# Patient Record
Sex: Female | Born: 1970 | Race: Black or African American | Hispanic: No | Marital: Single | Smoking: Never smoker
Health system: Southern US, Community
[De-identification: ages and names within clinical notes are randomized; demographics above are authoritative.]

## PROBLEM LIST (undated history)

## (undated) ENCOUNTER — Inpatient Hospital Stay (HOSPITAL_COMMUNITY): Payer: Self-pay

## (undated) DIAGNOSIS — D649 Anemia, unspecified: Secondary | ICD-10-CM

## (undated) DIAGNOSIS — T7840XA Allergy, unspecified, initial encounter: Secondary | ICD-10-CM

## (undated) HISTORY — DX: Allergy, unspecified, initial encounter: T78.40XA

## (undated) HISTORY — DX: Anemia, unspecified: D64.9

## (undated) HISTORY — PX: NO PAST SURGERIES: SHX2092

---

## 2000-05-02 ENCOUNTER — Ambulatory Visit (HOSPITAL_COMMUNITY): Admission: RE | Admit: 2000-05-02 | Discharge: 2000-05-02 | Payer: Self-pay | Admitting: Obstetrics

## 2000-09-19 ENCOUNTER — Observation Stay (HOSPITAL_COMMUNITY): Admission: AD | Admit: 2000-09-19 | Discharge: 2000-09-20 | Payer: Self-pay | Admitting: *Deleted

## 2000-09-20 ENCOUNTER — Encounter: Payer: Self-pay | Admitting: Obstetrics & Gynecology

## 2000-09-23 ENCOUNTER — Encounter (HOSPITAL_COMMUNITY): Admission: RE | Admit: 2000-09-23 | Discharge: 2000-09-27 | Payer: Self-pay | Admitting: Obstetrics & Gynecology

## 2000-09-27 ENCOUNTER — Inpatient Hospital Stay (HOSPITAL_COMMUNITY): Admission: AD | Admit: 2000-09-27 | Discharge: 2000-10-02 | Payer: Self-pay | Admitting: Obstetrics

## 2003-02-22 ENCOUNTER — Ambulatory Visit (HOSPITAL_COMMUNITY): Admission: RE | Admit: 2003-02-22 | Discharge: 2003-02-22 | Payer: Self-pay | Admitting: Obstetrics and Gynecology

## 2003-04-12 ENCOUNTER — Inpatient Hospital Stay (HOSPITAL_COMMUNITY): Admission: AD | Admit: 2003-04-12 | Discharge: 2003-04-12 | Payer: Self-pay | Admitting: Obstetrics and Gynecology

## 2003-04-20 ENCOUNTER — Ambulatory Visit (HOSPITAL_COMMUNITY): Admission: RE | Admit: 2003-04-20 | Discharge: 2003-04-20 | Payer: Self-pay | Admitting: *Deleted

## 2003-04-21 ENCOUNTER — Encounter: Admission: RE | Admit: 2003-04-21 | Discharge: 2003-04-21 | Payer: Self-pay | Admitting: *Deleted

## 2003-04-28 ENCOUNTER — Encounter: Admission: RE | Admit: 2003-04-28 | Discharge: 2003-04-28 | Payer: Self-pay | Admitting: *Deleted

## 2003-05-05 ENCOUNTER — Encounter: Admission: RE | Admit: 2003-05-05 | Discharge: 2003-05-05 | Payer: Self-pay | Admitting: *Deleted

## 2003-05-05 ENCOUNTER — Ambulatory Visit (HOSPITAL_COMMUNITY): Admission: RE | Admit: 2003-05-05 | Discharge: 2003-05-05 | Payer: Self-pay | Admitting: *Deleted

## 2003-05-12 ENCOUNTER — Encounter: Admission: RE | Admit: 2003-05-12 | Discharge: 2003-05-12 | Payer: Self-pay | Admitting: *Deleted

## 2003-05-19 ENCOUNTER — Encounter: Admission: RE | Admit: 2003-05-19 | Discharge: 2003-05-19 | Payer: Self-pay | Admitting: *Deleted

## 2003-05-26 ENCOUNTER — Encounter: Admission: RE | Admit: 2003-05-26 | Discharge: 2003-05-26 | Payer: Self-pay | Admitting: Obstetrics & Gynecology

## 2003-05-30 ENCOUNTER — Inpatient Hospital Stay (HOSPITAL_COMMUNITY): Admission: AD | Admit: 2003-05-30 | Discharge: 2003-05-30 | Payer: Self-pay | Admitting: Obstetrics and Gynecology

## 2003-06-09 ENCOUNTER — Encounter: Admission: RE | Admit: 2003-06-09 | Discharge: 2003-06-09 | Payer: Self-pay | Admitting: *Deleted

## 2003-06-17 ENCOUNTER — Encounter: Admission: RE | Admit: 2003-06-17 | Discharge: 2003-06-17 | Payer: Self-pay | Admitting: *Deleted

## 2003-06-23 ENCOUNTER — Encounter: Admission: RE | Admit: 2003-06-23 | Discharge: 2003-06-23 | Payer: Self-pay | Admitting: Obstetrics & Gynecology

## 2003-06-30 ENCOUNTER — Encounter: Admission: RE | Admit: 2003-06-30 | Discharge: 2003-06-30 | Payer: Self-pay | Admitting: *Deleted

## 2003-07-07 ENCOUNTER — Encounter: Admission: RE | Admit: 2003-07-07 | Discharge: 2003-07-07 | Payer: Self-pay | Admitting: Obstetrics & Gynecology

## 2003-07-12 ENCOUNTER — Inpatient Hospital Stay (HOSPITAL_COMMUNITY): Admission: AD | Admit: 2003-07-12 | Discharge: 2003-07-14 | Payer: Self-pay | Admitting: Family Medicine

## 2005-06-20 ENCOUNTER — Ambulatory Visit: Payer: Self-pay | Admitting: Family Medicine

## 2005-11-26 ENCOUNTER — Inpatient Hospital Stay (HOSPITAL_COMMUNITY): Admission: AD | Admit: 2005-11-26 | Discharge: 2005-11-26 | Payer: Self-pay | Admitting: Obstetrics

## 2006-02-01 ENCOUNTER — Inpatient Hospital Stay (HOSPITAL_COMMUNITY): Admission: AD | Admit: 2006-02-01 | Discharge: 2006-02-01 | Payer: Self-pay | Admitting: Obstetrics

## 2006-02-02 ENCOUNTER — Inpatient Hospital Stay (HOSPITAL_COMMUNITY): Admission: AD | Admit: 2006-02-02 | Discharge: 2006-02-04 | Payer: Self-pay | Admitting: Obstetrics

## 2006-02-06 ENCOUNTER — Inpatient Hospital Stay (HOSPITAL_COMMUNITY): Admission: AD | Admit: 2006-02-06 | Discharge: 2006-02-06 | Payer: Self-pay | Admitting: Obstetrics

## 2010-07-28 NOTE — Discharge Summary (Signed)
[facility]  Patient:    Paula [name], Paula [name] Visit Number: [id number] [name]: [id number]          Service Type: OBS Location: 910A 9101 [id number] Attending Physician:  [name] Dictated by:   [name], C.N.M. Admit Date:  09/27/2000 Discharge Date: 10/02/2000                             Discharge Summary  ADMITTING DIAGNOSIS:          1. Intrauterine pregnancy at 39 weeks and five                                  days in early labor.                               2. Preeclampsia.                               3. Spontaneous rupture of membranes.  DISCHARGE DIAGNOSES:          1. Spontaneous vaginal delivery.                               2. Preeclampsia.                               3. Febrile.  HISTORY:                      The patient is a 40 year old gravida 1, para 0, at 39 weeks and 5 days on the date of admission, with an EDC of September 28, 2000, in spontaneous labor with spontaneous rupture of membranes at 0300 in the morning with clear fluid.  The patient has been followed during hospitalization through [facility] since onset of prenatal care at 14 weeks.  Preeclampsia symptoms onset at 38 weeks with diagnosis of mild preeclampsia.  The patient was scheduled for induction of the date of admission, but presented with spontaneous labor.  No previous obstetrical, medical, or surgical history.  The patient has no known drug allergies.  She is a negative beta strep carrier.  VITAL SIGNS:                  On admission, blood pressure 153/93, 167/76.  HOSPITAL COURSE:              The patient was admitted to labor and delivery through routine teaching service intrapartum care.  The patient underwent magnesium sulfate prophylactic therapy for elevated blood pressure and preeclampsia.  The patient had epidural anesthesia for pain management and labor and received oxytocin augmentation of labor.  The patient had spontaneous vaginal  delivery of a female over a midline episiotomy at 2256 on July 19.  A female with Apgars of 8 and 8 at one minute and five minutes, respectively.  Repair of midline episiotomy.  The patient was transferred to the adult ICU for postpartum magnesium therapy for 48 hours post delivery.  LABORATORY:                   Initial laboratory work, admitting hemoglobin 12.3, hematocrit 36.4, white blood  cell count is 6.6, platelet count of 174. AST value on the day of admission, correct.  AST on the day of admission, 30. ALT 18, LDH of 227, and uric acid of 5.  Values remain stable with uric acid coming down to 4.6.  Prior to discharge, LDH of 297 down from 303, with ALT and AST remaining stable.  Hemoglobin on the day of discharge was 8.6, hematocrit 25.3, platelet count of 248, and a white blood cell count of 17.3.  The patient was noted to have temperatures on day #4 of hospitalization with a T-max of 101.5.  The patient was discharged on October 02, 2000, in the evening after request for discharge home.  The patient was afebrile during the course of the day of July 24.  Last temperature of 101.5 on July 24, early a.m. Urine culture showed no growth.  Blood cultures showed no growth.  Physical examination was normal with some breast tenderness and engorgement present.  DISPOSITION:                  Per telephone consult with Dr. [name] regarding discharge home per family request, the patient was discharged home with instructions to check temperature 3-4 times a day and to return with any temperatures greater than 101.0, any increasing pain, or increase in bleeding. Prescriptions were given for Motrin p.r.n. as needed for discomfort and Micronor birth control to start the second Sunday following discharge.  The patient will have Rubella vaccine at [facility] appointment at six weeks postpartum, secondary to possibility of compounding elevated temperatures with Rubella vaccine.  The patient is  discharged home with her family in stable condition to return with any complications or elevated temperatures. Follow-up is routine scheduled at [facility] in six weeks. Dictated by:   [name], C.N.M. Attending Physician:  [name] DD:  11/20/00 TD:  11/20/00 Job: [id number]

## 2010-07-28 NOTE — Group Therapy Note (Signed)
NAMELATORI, BEGGS NO.:  [id number]   MEDICAL RECORD NO.:  [id number]          PATIENT TYPE:  WOC   LOCATION:  [facility]                   FACILITY:  [facility]   PHYSICIAN:  Tinnie Gens, MD        DATE OF BIRTH:  [birth year]   DATE OF SERVICE:                                    CLINIC NOTE   CHIEF COMPLAINT:  Amenorrhea, nausea and vomiting.   HISTORY OF PRESENT ILLNESS:  The patient is a 40 year old gravida 2, para 2,  who had a previous delivery with [place] in 2005, who was followed in high risk  for fetal arrhythmia.  The patient comes in today with her last menstrual  period being May 04, 2005, and having lots of nausea and vomiting,  stating she cannot keep anything down.  The patient is otherwise without  complaints today.   PHYSICAL EXAMINATION:  VITAL SIGNS:  As noted in the chart.  GENERAL:  She is a well-developed, well-nourished black female in no acute  distress.  ABDOMEN:  Soft, nontender, nondistended.   LABORATORY DATA:  UPT is positive.   IMPRESSION:  1.  Amenorrhea.  2.  Nausea and vomiting.  3.  Positive pregnancy test.   Numbers one and two are both explained by number three.   PLAN:  1.  Prenatal vitamins.  2.  Phenergan.  3.  Referral to obtain prenatal care.  Her EDC is February 10, 2006.           ______________________________  Tinnie Gens, MD     TP/MEDQ  D:  06/20/2005  T:  06/20/2005  Job:  [id number]

## 2012-02-06 ENCOUNTER — Other Ambulatory Visit (HOSPITAL_COMMUNITY): Payer: Self-pay | Admitting: Obstetrics

## 2012-02-06 DIAGNOSIS — O09529 Supervision of elderly multigravida, unspecified trimester: Secondary | ICD-10-CM

## 2012-02-08 ENCOUNTER — Ambulatory Visit (HOSPITAL_COMMUNITY): Payer: BC Managed Care – PPO | Attending: Obstetrics

## 2012-02-18 ENCOUNTER — Emergency Department (HOSPITAL_COMMUNITY): Payer: BC Managed Care – PPO

## 2012-02-18 ENCOUNTER — Encounter (HOSPITAL_COMMUNITY): Payer: Self-pay

## 2012-02-18 ENCOUNTER — Inpatient Hospital Stay (HOSPITAL_COMMUNITY)
Admission: EM | Admit: 2012-02-18 | Discharge: 2012-02-19 | Disposition: A | Discharge status: Home / Self Care | Payer: BC Managed Care – PPO | Attending: Obstetrics | Admitting: Obstetrics

## 2012-02-18 DIAGNOSIS — N939 Abnormal uterine and vaginal bleeding, unspecified: Secondary | ICD-10-CM

## 2012-02-18 DIAGNOSIS — O209 Hemorrhage in early pregnancy, unspecified: Secondary | ICD-10-CM | POA: Insufficient documentation

## 2012-02-18 DIAGNOSIS — R109 Unspecified abdominal pain: Secondary | ICD-10-CM | POA: Insufficient documentation

## 2012-02-18 LAB — URINALYSIS, ROUTINE W REFLEX MICROSCOPIC
Bilirubin Urine: NEGATIVE
Glucose, UA: NEGATIVE mg/dL
Specific Gravity, Urine: 1.019 (ref 1.005–1.030)
Urobilinogen, UA: 0.2 mg/dL (ref 0.0–1.0)
pH: 6.5 (ref 5.0–8.0)

## 2012-02-18 LAB — CBC
MCHC: 31.9 g/dL (ref 30.0–36.0)
MCV: 77.9 fL — ABNORMAL LOW (ref 78.0–100.0)
Platelets: 277 10*3/uL (ref 150–400)
RDW: 18.3 % — ABNORMAL HIGH (ref 11.5–15.5)
WBC: 7.1 10*3/uL (ref 4.0–10.5)

## 2012-02-18 LAB — HCG, QUANTITATIVE, PREGNANCY: hCG, Beta Chain, Quant, S: 1885 m[IU]/mL — ABNORMAL HIGH (ref <5)

## 2012-02-18 LAB — URINE MICROSCOPIC-ADD ON

## 2012-02-18 MED ORDER — MORPHINE SULFATE 4 MG/ML IJ SOLN
4.0000 mg | Freq: Once | INTRAMUSCULAR | Status: AC
  Administered 2012-02-18: 4 mg via INTRAVENOUS
  Filled 2012-02-18: qty 1

## 2012-02-18 MED ORDER — SODIUM CHLORIDE 0.9 % IV BOLUS (SEPSIS)
1000.0000 mL | Freq: Once | INTRAVENOUS | Status: AC
  Administered 2012-02-18: 1000 mL via INTRAVENOUS

## 2012-02-18 MED ORDER — OXYTOCIN 10 UNIT/ML IJ SOLN: 10.0000 [IU] | Freq: Once | INTRAMUSCULAR | Status: DC

## 2012-02-18 MED ORDER — ONDANSETRON HCL 4 MG/2ML IJ SOLN
4.0000 mg | Freq: Once | INTRAMUSCULAR | Status: AC
  Administered 2012-02-18: 4 mg via INTRAVENOUS
  Filled 2012-02-18: qty 2

## 2012-02-18 NOTE — ED Notes (Signed)
Pt present [redacted] weeks pregnant.  Pt c/o vaginal bleeding and paining x 30 minutes now.  Pt deneis hx miscarriage.  Pt deniez n/v or weakness.  Pt pt reports started off with just spots, but now its coming more."  Pt reports its saturating now."

## 2012-02-18 NOTE — ED Notes (Signed)
Bed:WA17<BR> Expected date:<BR> Expected time:<BR> Means of arrival:<BR> Comments:<BR> triage

## 2012-02-18 NOTE — ED Provider Notes (Signed)
History     CSN: [id number]  Arrival date & time 02/18/12  1735   First MD Initiated Contact with Patient 02/18/12 1905      Chief Complaint  Patient presents with  . Vaginal Bleeding    (Consider location/radiation/quality/duration/timing/severity/associated sxs/prior treatment) HPI Pt presents with c/o vaginal bleeding.  She is a G4P3 at approx [redacted] weeks gestation.  Lower abdominal cramping and vaginal bleeding started today.  She has been soaking more than one pad per hour.  No fainting or dizziness.  No fever/chills, no vomiting.  States she has not had any prior problems with pregnancies or with this pregnancy. No hx of easy bruising or bleeding. There are no other associated systemic symptoms, there are no other alleviating or modifying factors.   No past medical history on file.  No past surgical history on file.  No family history on file.  History  Substance Use Topics  . Smoking status: Never Smoker   . Smokeless tobacco: Never Used  . Alcohol Use: No    OB History    Grav Para Term Preterm Abortions TAB SAB Ect Mult Living   1         3      Review of Systems ROS reviewed and all otherwise negative except for mentioned in HPI  Allergies  Review of patient's allergies indicates no known allergies.  Home Medications   Current Outpatient Rx  Name  Route  Sig  Dispense  Refill  . DIPHENHYDRAMINE HCL 25 MG PO TABS   Oral   Take 50 mg by mouth every 6 (six) hours as needed. Sleep         . PRENATAL MULTIVITAMIN CH   Oral   Take 1 tablet by mouth daily.           BP 101/45  Pulse 66  Temp 97.6 F (36.4 C) (Oral)  Resp 17  SpO2 100% Vitals reviewed Physical Exam Physical Examination: General appearance - alert, well appearing, and in no distress Eyes - no scleral icterus, no conjunctival injection Mouth - mucous membranes moist, pharynx normal without lesions Chest - clear to auscultation, no wheezes, rales or rhonchi, symmetric air  entry Heart - normal rate, regular rhythm, normal S1, S2, no murmurs, rubs, clicks or gallops Abdomen - soft, nontender, nondistended, no masses or organomegaly, nabs Pelvic - normal external genitalia, vulva, vagina, cervical os is fingertip open, no POC visualized, large amount of bright red blood in vaginal vault.  Neurological - alert, oriented, normal speech, no focal findings or movement disorder noted Extremities - peripheral pulses normal, no pedal edema, no clubbing or cyanosis Skin - normal coloration and turgor, no rashes  ED Course  Procedures (including critical care time)  11:17 PM pt continues to have bright red vaginal bleeding, but she states it has slowed, she states she went to the bathroom and had a large gush of blood with clots.  Cervical os is open fingertip, no products of conception at os at this time.  Will d/w OB  11:28 PM d/w Dr. Gaynell Face, OB- he recommends 10 Units pitocin in 500cc LR to run IV as bolus and transfer to MAU for observation to be sure symptoms/bleeding has slowed.  Pt agreeable with this plan.  CRITICAL CARE Performed by: Ethelda Chick   Total critical care time: 35  Critical care time was exclusive of separately billable procedures and treating other patients.  Critical care was necessary to treat or prevent imminent or life-threatening deterioration.  Critical care was time spent personally by me on the following activities: development of treatment plan with patient and/or surrogate as well as nursing, discussions with consultants, evaluation of patient's response to treatment, examination of patient, obtaining history from patient or surrogate, ordering and performing treatments and interventions, ordering and review of laboratory studies, ordering and review of radiographic studies, pulse oximetry and re-evaluation of patient's condition.   Labs Reviewed  CBC - Abnormal; Notable for the following:    Hemoglobin 10.7 (*)     HCT 33.5 (*)      MCV 77.9 (*)     MCH 24.9 (*)     RDW 18.3 (*)     All other components within normal limits  PREGNANCY, URINE - Abnormal; Notable for the following:    Preg Test, Ur POSITIVE (*)     All other components within normal limits  URINALYSIS, ROUTINE W REFLEX MICROSCOPIC - Abnormal; Notable for the following:    Hgb urine dipstick SMALL (*)     Leukocytes, UA SMALL (*)     All other components within normal limits  HCG, QUANTITATIVE, PREGNANCY - Abnormal; Notable for the following:    hCG, Beta Chain, Quant, S 1885 (*)     All other components within normal limits  URINE MICROSCOPIC-ADD ON - Abnormal; Notable for the following:    Squamous Epithelial / LPF FEW (*)     All other components within normal limits  TYPE AND SCREEN  ABO/RH   US Ob Comp Less 14 Wks  02/18/2012  *RADIOLOGY REPORT*  Clinical Data: Early pregnancy, bleeding; quantitative beta HCG 1185  OBSTETRIC <14 WK [place] AND TRANSVAGINAL OB US  Technique:  Both transabdominal and transvaginal ultrasound examinations were performed for complete evaluation of the gestation as well as the maternal uterus, adnexal regions, and pelvic cul-de-sac.  Transvaginal technique was performed to assess early pregnancy.  Comparison:  None for this pregnancy  Intrauterine gestational sac:  Not identified Yolk sac: N/A Embryo: N/A Cardiac Activity: N/A Heart Rate: N/A bpm  Maternal uterus/adnexae: Uterus measures 12.2 x 8.2 x 6.4 cm. Heterogeneous appearance of a thickened endometrial complex 2.7 cm thick. Small amount of fluid at endocervical canal. Right ovary measures 4.4 x 2.8 x 4.0 cm and contains a small corpus luteal cyst. Left ovary 2.5 x 2.6 x 2.8 cm, limited visualization. No free pelvic fluid or adnexal masses otherwise seen.  IMPRESSION: No evidence of an anterior and gestation. Thickened heterogeneous appearing endometrial complex 2.7 cm thick, question blood. No significant extrauterine abnormalities identified. In the absence of a  documented intrauterine gestation, differential diagnosis includes spontaneous abortion, ectopic pregnancy and early intrauterine pregnancy too early to visualize, favor spontaneous abortion due to presence of potentially blood within the endometrial canal. Recommend serial quantitative beta HCG and/or follow-up ultrasound to differentiate.   Original Report Authenticated By: Ulyses Southward, M.D.    US Ob Transvaginal  02/18/2012  *RADIOLOGY REPORT*  Clinical Data: Early pregnancy, bleeding; quantitative beta HCG 1185  OBSTETRIC <14 WK [place] AND TRANSVAGINAL OB US  Technique:  Both transabdominal and transvaginal ultrasound examinations were performed for complete evaluation of the gestation as well as the maternal uterus, adnexal regions, and pelvic cul-de-sac.  Transvaginal technique was performed to assess early pregnancy.  Comparison:  None for this pregnancy  Intrauterine gestational sac:  Not identified Yolk sac: N/A Embryo: N/A Cardiac Activity: N/A Heart Rate: N/A bpm  Maternal uterus/adnexae: Uterus measures 12.2 x 8.2 x 6.4 cm. Heterogeneous appearance of a  thickened endometrial complex 2.7 cm thick. Small amount of fluid at endocervical canal. Right ovary measures 4.4 x 2.8 x 4.0 cm and contains a small corpus luteal cyst. Left ovary 2.5 x 2.6 x 2.8 cm, limited visualization. No free pelvic fluid or adnexal masses otherwise seen.  IMPRESSION: No evidence of an anterior and gestation. Thickened heterogeneous appearing endometrial complex 2.7 cm thick, question blood. No significant extrauterine abnormalities identified. In the absence of a documented intrauterine gestation, differential diagnosis includes spontaneous abortion, ectopic pregnancy and early intrauterine pregnancy too early to visualize, favor spontaneous abortion due to presence of potentially blood within the endometrial canal. Recommend serial quantitative beta HCG and/or follow-up ultrasound to differentiate.   Original Report Authenticated  By: Ulyses Southward, M.D.      1. Vaginal bleeding       MDM  Pt approx [redacted] weeks pregnant with vaginal bleeding and lower abdominal cramping.  U/s shows no IUP.  Pt has mild anemia.  Initially was tachycardic but this resolved after IV hdyration.  Pt has had large amount of bleeding in ED, but it seems to be slowing and cramping is improved.  Ectopic versus spontaneous abortion with spont ab being more likely given the pattern of bleeding.  D/w Dr. Gaynell Face.  Pt to have pitocin started and transfer to MAU.          Ethelda Chick, MD 02/19/12 [phone]

## 2012-02-19 ENCOUNTER — Encounter (HOSPITAL_COMMUNITY): Payer: Self-pay | Admitting: *Deleted

## 2012-02-19 LAB — CBC
Platelets: 185 10*3/uL (ref 150–400)
RBC: 2.92 MIL/uL — ABNORMAL LOW (ref 3.87–5.11)
RDW: 18.6 % — ABNORMAL HIGH (ref 11.5–15.5)
WBC: 6.3 10*3/uL (ref 4.0–10.5)

## 2012-02-19 LAB — TYPE AND SCREEN

## 2012-02-19 LAB — ABO/RH: ABO/RH(D): O POS

## 2012-02-19 MED ORDER — OXYTOCIN 10 UNIT/ML IJ SOLN
Freq: Once | INTRAVENOUS | Status: AC
  Administered 2012-02-19: 01:00:00 via INTRAVENOUS
  Filled 2012-02-19: qty 500

## 2012-02-19 MED ORDER — FERROUS SULFATE 325 (65 FE) MG PO TABS: 325.0000 mg | ORAL_TABLET | Freq: Every day | ORAL | Status: DC

## 2012-02-19 MED ORDER — OXYTOCIN 10 UNIT/ML IJ SOLN: 10.0000 [IU] | Freq: Once | INTRAMUSCULAR | Status: DC

## 2012-02-19 NOTE — MAU Provider Note (Signed)
Paula [name] is a 41 y.o. female early pregnant who arrived to MAU via [facility] from [facility] with vaginal bleeding. She was evaluated in the ED and had labs, ultrasound and pelvic exam. She had a positive pregnancy test and her Bhcg was 1800. Her ultrasound showed no IUP and her pelvic exam showed moderate bleeding. The ER physician spoke with Dr. Gaynell Face and he requested that Pitocin be added to her IV fluids and the patient sent to MAU for observation.   BP 97/66  Pulse 80  Temp 98 F (36.7 C) (Oral)  Resp 18  SpO2 99%  The patient is alert and oriented, in no acute distress. She reports that the bleeding has slowed down and she is feeling better.   Assessment: 41 y.o. female with vaginal bleeding   SAB  Plan:  Observe   Repeat CBC prior to discharge   Medical screening exam complete RN to notify Dr. Gaynell Face prior to discharge.

## 2012-02-19 NOTE — MAU Note (Signed)
Arrived from [facility] with mod vaginal bleeding and mild lower abd cramping.

## 2012-02-19 NOTE — MAU Note (Signed)
SMALL AMT LIGHT RED BLEEDING ON PAD - GAVE NEW PAD.  -  NO CRAMPING

## 2012-02-19 NOTE — ED Notes (Signed)
Report given to [facility]  

## 2012-02-20 ENCOUNTER — Other Ambulatory Visit (HOSPITAL_COMMUNITY): Payer: Self-pay | Admitting: Obstetrics

## 2012-02-20 DIAGNOSIS — O09529 Supervision of elderly multigravida, unspecified trimester: Secondary | ICD-10-CM

## 2012-02-21 ENCOUNTER — Ambulatory Visit (HOSPITAL_COMMUNITY): Payer: BC Managed Care – PPO

## 2012-04-13 ENCOUNTER — Encounter (HOSPITAL_BASED_OUTPATIENT_CLINIC_OR_DEPARTMENT_OTHER): Payer: BC Managed Care – PPO

## 2012-05-09 ENCOUNTER — Encounter (HOSPITAL_COMMUNITY): Payer: Self-pay | Admitting: Emergency Medicine

## 2012-05-09 ENCOUNTER — Emergency Department (HOSPITAL_COMMUNITY)
Admission: EM | Admit: 2012-05-09 | Discharge: 2012-05-10 | Disposition: A | Discharge status: Home / Self Care | Payer: BC Managed Care – PPO | Attending: Emergency Medicine | Admitting: Emergency Medicine

## 2012-05-09 DIAGNOSIS — K0889 Other specified disorders of teeth and supporting structures: Secondary | ICD-10-CM

## 2012-05-09 DIAGNOSIS — H9209 Otalgia, unspecified ear: Secondary | ICD-10-CM | POA: Insufficient documentation

## 2012-05-09 DIAGNOSIS — K089 Disorder of teeth and supporting structures, unspecified: Secondary | ICD-10-CM | POA: Insufficient documentation

## 2012-05-09 DIAGNOSIS — R51 Headache: Secondary | ICD-10-CM | POA: Insufficient documentation

## 2012-05-09 NOTE — ED Notes (Signed)
Pt arrived to ED with a complaint of tooth pain on the left side on both the upper and lower rear molars.

## 2012-05-10 MED ORDER — BUPIVACAINE-EPINEPHRINE PF 0.5-1:200000 % IJ SOLN
1.8000 mL | Freq: Once | INTRAMUSCULAR | Status: AC
  Administered 2012-05-10: 9 mg
  Filled 2012-05-10 (×2): qty 1.8

## 2012-05-10 MED ORDER — OXYCODONE-ACETAMINOPHEN 5-325 MG PO TABS: 1.0000 | ORAL_TABLET | ORAL | Status: DC | PRN

## 2012-05-10 MED ORDER — PENICILLIN V POTASSIUM 500 MG PO TABS: 500.0000 mg | ORAL_TABLET | Freq: Four times a day (QID) | ORAL | Status: DC

## 2012-05-10 NOTE — ED Provider Notes (Signed)
History     CSN: [id number]  Arrival date & time 05/09/12  2347   First MD Initiated Contact with Patient 05/09/12 2356      Chief Complaint  Patient presents with  . Dental Pain    (Consider location/radiation/quality/duration/timing/severity/associated sxs/prior treatment) The history is provided by the patient, medical records and the spouse. No language interpreter was used.    Paula [name] is a 42 y.o. female  with no past medical hx presents to the Emergency Department complaining of gradual, persistent, progressively worsening dental pain onset 5 pm.  Pt last saw the dentist 6 months ago.  Associated symptoms include headache, ear pain.  Ibuprofen makes it better and eating makes it worse.  Pt denies fever, chills, neck pain, chest pain, shortness of breath, abdominal pain, nausea, vomiting, diarrhea, weakness, dizziness, syncope..     Past Medical History  Diagnosis Date  . No pertinent past medical history     Past Surgical History  Procedure Laterality Date  . No past surgeries      Family History  Problem Relation Age of Onset  . Hypertension Mother   . Hypertension Sister     History  Substance Use Topics  . Smoking status: Never Smoker   . Smokeless tobacco: Never Used  . Alcohol Use: No    OB History   Grav Para Term Preterm Abortions TAB SAB Ect Mult Living   4 3 3       3       Review of Systems  Constitutional: Negative for fever, chills and appetite change.  HENT: Positive for ear pain and dental problem. Negative for nosebleeds, facial swelling, rhinorrhea, drooling, trouble swallowing, neck pain, neck stiffness and postnasal drip.   Eyes: Negative for pain and redness.  Respiratory: Negative for cough and wheezing.   Cardiovascular: Negative for chest pain.  Gastrointestinal: Negative for nausea, vomiting and abdominal pain.  Skin: Negative for color change and rash.  Neurological: Positive for headaches. Negative for weakness and  light-headedness.  All other systems reviewed and are negative.    Allergies  Review of patient's allergies indicates no known allergies.  Home Medications   Current Outpatient Rx  Name  Route  Sig  Dispense  Refill  . ferrous sulfate 325 (65 FE) MG tablet   Oral   Take 1 tablet (325 mg total) by mouth daily.   30 tablet   0   . Prenatal Vit-Fe Fumarate-FA (PRENATAL MULTIVITAMIN) TABS   Oral   Take 1 tablet by mouth daily.         Marland Kitchen oxyCODONE-acetaminophen (PERCOCET) 5-325 MG per tablet   Oral   Take 1 tablet by mouth every 4 (four) hours as needed for pain.   20 tablet   0   . penicillin v potassium (VEETID) 500 MG tablet   Oral   Take 1 tablet (500 mg total) by mouth 4 (four) times daily.   40 tablet   0     BP 127/74  Pulse 82  Resp 18  SpO2 100%  Breastfeeding? Unknown  Physical Exam  Nursing note and vitals reviewed. Constitutional: She is oriented to person, place, and time. She appears well-developed and well-nourished.  HENT:  Head: Normocephalic and atraumatic.  Right Ear: Tympanic membrane, external ear and ear canal normal.  Left Ear: Tympanic membrane, external ear and ear canal normal.  Nose: Nose normal. Right sinus exhibits no maxillary sinus tenderness and no frontal sinus tenderness. Left sinus exhibits no maxillary sinus  tenderness and no frontal sinus tenderness.  Mouth/Throat: Uvula is midline, oropharynx is clear and moist and mucous membranes are normal. Mucous membranes are not dry and not cyanotic. No oral lesions. Abnormal dentition. Dental caries present. No dental abscesses, edematous or lacerations. No oropharyngeal exudate, posterior oropharyngeal edema, posterior oropharyngeal erythema or tonsillar abscesses.    No erythema, small caries noted, no gross abscess  Eyes: Conjunctivae are normal. Pupils are equal, round, and reactive to light. Right eye exhibits no discharge. Left eye exhibits no discharge.  Neck: Normal range of  motion. Neck supple.  Cardiovascular: Normal rate, regular rhythm, normal heart sounds and intact distal pulses.  Exam reveals no gallop and no friction rub.   No murmur heard. Pulmonary/Chest: Effort normal and breath sounds normal. No respiratory distress. She has no wheezes.  Abdominal: Soft. Bowel sounds are normal. She exhibits no distension. There is no tenderness.  Musculoskeletal: Normal range of motion. She exhibits no tenderness.  Lymphadenopathy:    She has no cervical adenopathy.  Neurological: She is alert and oriented to person, place, and time. She exhibits normal muscle tone. Coordination normal.  Skin: Skin is warm and dry. No rash noted. No erythema.  Psychiatric: She has a normal mood and affect.    ED Course  Dental Date/Time: 05/10/2012 1:09 AM Performed by: Dierdre Forth Authorized by: Dierdre Forth Consent: Verbal consent obtained. Risks and benefits: risks, benefits and alternatives were discussed Consent given by: patient Patient understanding: patient states understanding of the procedure being performed Patient consent: the patient's understanding of the procedure matches consent given Procedure consent: procedure consent matches procedure scheduled Relevant documents: relevant documents present and verified Site marked: the operative site was marked Required items: required blood products, implants, devices, and special equipment available Patient identity confirmed: verbally with patient Time out: Immediately prior to procedure a "time out" was called to verify the correct patient, procedure, equipment, support staff and site/side marked as required. Preparation: Patient was prepped and draped in the usual sterile fashion. Local anesthesia used: yes Anesthesia: local infiltration Local anesthetic: bupivacaine 0.5% with epinephrine Anesthetic total: 0.5 ml Patient sedated: no Patient tolerance: Patient tolerated the procedure well with no  immediate complications. Comments: Dental block of teeth # 13, 14, 19 without complication and with resolution of pain   (including critical care time)  Labs Reviewed - No data to display No results found.   1. Pain, dental       MDM  Paula [name] presents with dental pain.  Patient with toothache.  No gross abscess.  Exam unconcerning for Ludwig's angina or spread of infection.  Dental block of teeth # 13, 14, 19 without complication and with resolution of pain.  Will treat with penicillin and pain medicine.  Urged patient to follow-up with dentist.    1. Medications: percocet, penicillin, usual home medications 2. Treatment: rest, drink plenty of fluids, take medications as prescribed 3. Follow Up: Please followup with your primary doctor for discussion of your diagnoses and further evaluation after today's visit; if you do not have a primary care doctor use the resource guide provided to find one; f/u with dentistry as discussed           Dierdre Forth, PA-C 05/10/12 0111

## 2012-05-10 NOTE — ED Provider Notes (Signed)
Medical screening examination/treatment/procedure(s) were performed by non-physician practitioner and as supervising physician I was immediately available for consultation/collaboration.   Tajuan Dufault, MD 05/10/12 0550 

## 2012-06-25 ENCOUNTER — Ambulatory Visit (INDEPENDENT_AMBULATORY_CARE_PROVIDER_SITE_OTHER): Payer: BC Managed Care – PPO | Admitting: Family Medicine

## 2012-06-25 VITALS — BP 122/78 | HR 72 | Temp 98.4°F | Resp 16 | Ht 63.0 in | Wt 152.0 lb

## 2012-06-25 DIAGNOSIS — J3489 Other specified disorders of nose and nasal sinuses: Secondary | ICD-10-CM

## 2012-06-25 DIAGNOSIS — R0981 Nasal congestion: Secondary | ICD-10-CM

## 2012-06-25 DIAGNOSIS — N644 Mastodynia: Secondary | ICD-10-CM

## 2012-06-25 DIAGNOSIS — J309 Allergic rhinitis, unspecified: Secondary | ICD-10-CM

## 2012-06-25 LAB — POCT URINE PREGNANCY: Preg Test, Ur: NEGATIVE

## 2012-06-25 MED ORDER — PREDNISONE 20 MG PO TABS: ORAL_TABLET | ORAL | Status: DC

## 2012-06-25 NOTE — Patient Instructions (Addendum)
Try some Afrin nasal spray- this will be very helpful for your congestion at night.   You can also use claritin or zyrtec over the counter.  Let us know if you are not better in the next few days- Sooner if worse.   Use the prednisone as directed  It is time to schedule your first mammogram!  Some resources include the [facility] hospital, [facility] and the [facility]

## 2012-06-25 NOTE — Progress Notes (Signed)
  Subjective:    Patient ID: Paula [name], female    DOB: 08-Feb-1971, 42 y.o.   [name]: [id number]  HPI 42 yo female complaining of sinus and chest congestion, ear pain, mild sore throat x 1 week. Denies cough. Denies fever/chills, diarrhea, N/V.  Denies any sick contacts. States she gets this once a year, and she gets a shot and it goes away. Her main problem is severe nasal congestion that keeps her up at night.  Has been taking Mucinex sinus which has not helped. She is also using flonase spray   Her chest feels "tight" and she feels "itchy on the inside"   Called [facility], where she states she usually gets her yearly "shot" to find out details  Also complaint of breast tenderness bilaterally for the last 3 or 4 days.  Her LMP was about 2 weeks ago.  She is using condoms for contraception since she had a SAB earlier this year and does not think she could be pregnant. She has not yet had a mammogram  Review of Systems  Constitutional: Positive for fatigue. Negative for fever and chills.  HENT: Positive for congestion, rhinorrhea and sinus pressure.   Respiratory: Positive for cough (productive, yellow mucus) and chest tightness. Negative for wheezing.   Cardiovascular: Negative.   Gastrointestinal: Negative.   Allergic/Immunologic: Negative for environmental allergies.       Objective:   Physical Exam  Constitutional: She appears well-developed and well-nourished.  HENT:  Head: Normocephalic and atraumatic.  Right Ear: External ear normal.  Left Ear: External ear normal.  Sinus congestion. Clear discharge  Eyes:  Watery bilaterally  Neck: Normal range of motion. Neck supple.  Cardiovascular: Normal rate, regular rhythm and normal heart sounds.   Pulmonary/Chest: Effort normal and breath sounds normal.  Abdominal: Soft. Bowel sounds are normal.  Skin: Skin is warm and dry.  breast exam: performed bilaterally. No masses, dimpling, discharge or abnormaltiy noted.  As her tenderness  is bilateral it is likely to be hormonal  Results for orders placed in visit on 06/25/12  POCT URINE PREGNANCY      Result Value Range   Preg Test, Ur Negative         Assessment & Plan:  Nasal congestion - Plan: predniSONE (DELTASONE) 20 MG tablet  Breast tenderness - Plan: POCT urine pregnancy  Allergic rhinitis   Nohealani has been treated with injectable steroids in the past for her allergies.  She does seem miserable with nasal congestion, discharge and sneezing.  Will give her a short course of oral steroids, continue flonase and use OTC medications as per pt instructions.   Time for her first mammogram in the next few months- given suggestions regarding where to schedule this, and encouraged her to call asap to get this appt scheduled.  If breast symptoms persist in the meantime she is to call or come back in.

## 2012-07-09 ENCOUNTER — Other Ambulatory Visit: Payer: Self-pay | Admitting: Family Medicine

## 2012-07-09 ENCOUNTER — Ambulatory Visit: Payer: BC Managed Care – PPO

## 2012-07-09 ENCOUNTER — Ambulatory Visit (INDEPENDENT_AMBULATORY_CARE_PROVIDER_SITE_OTHER): Payer: BC Managed Care – PPO | Admitting: Family Medicine

## 2012-07-09 VITALS — BP 126/83 | HR 89 | Temp 98.7°F | Resp 17 | Ht 63.0 in | Wt 148.0 lb

## 2012-07-09 DIAGNOSIS — J029 Acute pharyngitis, unspecified: Secondary | ICD-10-CM

## 2012-07-09 DIAGNOSIS — R079 Chest pain, unspecified: Secondary | ICD-10-CM

## 2012-07-09 DIAGNOSIS — R52 Pain, unspecified: Secondary | ICD-10-CM

## 2012-07-09 LAB — POCT CBC
HCT, POC: 43.6 % (ref 37.7–47.9)
Hemoglobin: 13.2 g/dL (ref 12.2–16.2)
Lymph, poc: 2 (ref 0.6–3.4)
MCHC: 30.3 g/dL — AB (ref 31.8–35.4)
MCV: 86.2 fL (ref 80–97)
POC Granulocyte: 5 (ref 2–6.9)
POC LYMPH PERCENT: 26.3 %L (ref 10–50)
RDW, POC: 17.7 %

## 2012-07-09 LAB — GLUCOSE, POCT (MANUAL RESULT ENTRY): POC Glucose: 80 mg/dl (ref 70–99)

## 2012-07-09 LAB — POCT URINE PREGNANCY: Preg Test, Ur: NEGATIVE

## 2012-07-09 MED ORDER — BENZONATATE 100 MG PO CAPS: 100.0000 mg | ORAL_CAPSULE | Freq: Three times a day (TID) | ORAL | Status: DC | PRN

## 2012-07-09 NOTE — Patient Instructions (Addendum)
Use the tessalon perles as needed for the feeling in your throat- or for cough.   I will be in touch with the rest of your labs as soon as they come in.    If you have any change or worsening of your symptoms please let me know If you develop any acute difficulty with breathing call 911

## 2012-07-09 NOTE — Progress Notes (Addendum)
[facility] [address] [place] [address] [phone]  Date:  07/09/2012   Name:  Paula [name]   DOB:  1970-08-13   [name]:  [id number]  PCP:  No primary provider on file.    Chief Complaint: Chest Pain   History of Present Illness:  Paula [name] is a 42 y.o. very pleasant female patient who presents with the following:  Seen here about 2 weeks ago with sinus and chest congestion, sore throat and ear pain. She was treated with prednisone for sever allergic rhinitis, as well as flonase and OTC medications as needed.    Today she is here with a few other symptoms.  She feels "uncomfortable" in her throat.  She has noted this for about 3 days.  She denies having a sore throat.  She does note a mild cough.  She notes some SOB "if I talk for a while, especially on the phone." Otherwise her breathing is ok.  She also notes not just chest pain, but actually pains in her entire body off and on for several months. These pains sometimes keep her awake at night, and she uses ambien as needed for insomnia.  She describes this as similar to the feeling of when her milk would come in when she was breastfeeding, but over her entire body  These pains over her entire body may last for one minute, but then may come back quickly. She has noted this every day for the last several months.    She has a slight earache, dry cough.  No nausea, vomiting or diarrhea.    Last labs in 2013 showed a significant anemia.    There are no active problems to display for this patient.   Past Medical History  Diagnosis Date  . No pertinent past medical history     Past Surgical History  Procedure Laterality Date  . No past surgeries      History  Substance Use Topics  . Smoking status: Never Smoker   . Smokeless tobacco: Never Used  . Alcohol Use: No    Family History  Problem Relation Age of Onset  . Hypertension Mother   . Hypertension Sister     No Known  Allergies  Medication list has been reviewed and updated.  Current Outpatient Prescriptions on File Prior to Visit  Medication Sig Dispense Refill  . ferrous sulfate 325 (65 FE) MG tablet Take 1 tablet (325 mg total) by mouth daily.  30 tablet  0   No current facility-administered medications on file prior to visit.    Review of Systems:  As per HPI- otherwise negative.   Physical Examination: Filed Vitals:   07/09/12 1052  BP: 126/83  Pulse: 89  Temp: 98.7 F (37.1 C)  Resp: 17   Filed Vitals:   07/09/12 1052  Height: 5\' 3"  (1.6 m)  Weight: 148 lb (67.132 kg)   Body mass index is 26.22 kg/(m^2). Ideal Body Weight: Weight in (lb) to have BMI = 25: 140.8  GEN: WDWN, NAD, Non-toxic, A & O x 3, looks well HEENT: Atraumatic, Normocephalic. Neck supple. No masses, No LAD.  Bilateral TM wnl, oropharynx normal.  PEERL,EOMI.  No angioedema.  Ears and Nose: No external deformity. CV: RRR, No M/G/R. No JVD. No thrill. No extra heart sounds. PULM: CTA B, no wheezes, crackles, rhonchi. No retractions. No resp. distress. No accessory muscle use. ABD: S, NT, ND, +BS. No rebound. No HSM. EXTR: No c/c/e NEURO Normal  gait. Neurologically normal PSYCH: Normally interactive. Conversant. Not depressed or anxious appearing.  Calm demeanor.   [facility] reading (PRIMARY) by  Dr. Patsy Lager. Chest x-ray: negative Neck x-ray: negative NECK SOFT TISSUES - 1+ VIEW  Comparison: None.  Findings: Frontal and lateral projection show a widely patent visualized airway. No soft tissue swelling or foreign body is identified. The epiglottis and aryepiglottic folds are well delineated. Visualized bony structures are unremarkable.  IMPRESSION: Normal neck soft tissue radiographs  CHEST - 2 VIEW  Comparison: None.  Findings: The heart, mediastinum and hilar contours are normal. The lungs are clear. There are no effusions or pneumothoraces. There are no acute bony changes.  IMPRESSION: Normal  chest.  EKG: NSR, no ST elevation or depression  Results for orders placed in visit on 07/09/12  POCT CBC      Result Value Range   WBC 7.7  4.6 - 10.2 K/uL   Lymph, poc 2.0  0.6 - 3.4   POC LYMPH PERCENT 26.3  10 - 50 %L   MID (cbc) 0.7  0 - 0.9   POC MID % 8.7  0 - 12 %M   POC Granulocyte 5.0  2 - 6.9   Granulocyte percent 65.0  37 - 80 %G   RBC 5.06  4.04 - 5.48 M/uL   Hemoglobin 13.2  12.2 - 16.2 g/dL   HCT, POC 16.1  09.6 - 47.9 %   MCV 86.2  80 - 97 fL   MCH, POC 26.1 (*) 27 - 31.2 pg   MCHC 30.3 (*) 31.8 - 35.4 g/dL   RDW, POC 04.5     Platelet Count, POC 283  142 - 424 K/uL   MPV 8.7  0 - 99.8 fL  POCT URINE PREGNANCY      Result Value Range   Preg Test, Ur Negative    GLUCOSE, POCT (MANUAL RESULT ENTRY)      Result Value Range   POC Glucose 80  70 - 99 mg/dl    Assessment and Plan: Body aches - Plan: POCT CBC, POCT urine pregnancy, Comprehensive metabolic panel, TSH, POCT glucose (manual entry)  Acute pharyngitis - Plan: DG Neck Soft Tissue, benzonatate (TESSALON) 100 MG capsule  Chest pain - Plan: DG Chest 2 View, EKG 12-Lead  Clarity is here with complaint of some unusual feelings today. Explained to her hat her symptoms do not readily fit with any common diagnosis.   Await the rest of her labs, and I will be in touch with her.  Evaluation so far is reassuring. If any change she is to let me know  Signed Abbe Amsterdam, MD  07/11/12- called to discuss her other labs- TSH and CP are normal.  Discussed her unusual feeling in her body.  I am not sure what could be causing her feeling.  She is taking vitamin D supplement as she was told her level was low in the past.  I will check this level for her and let her know

## 2012-07-10 LAB — COMPREHENSIVE METABOLIC PANEL
ALT: 12 U/L (ref 0–35)
CO2: 27 mEq/L (ref 19–32)
Calcium: 9.6 mg/dL (ref 8.4–10.5)
Chloride: 105 mEq/L (ref 96–112)
Creat: 0.67 mg/dL (ref 0.50–1.10)
Glucose, Bld: 84 mg/dL (ref 70–99)
Sodium: 140 mEq/L (ref 135–145)
Total Bilirubin: 0.3 mg/dL (ref 0.3–1.2)
Total Protein: 7.3 g/dL (ref 6.0–8.3)

## 2012-07-10 LAB — TSH: TSH: 1.301 u[IU]/mL (ref 0.350–4.500)

## 2012-07-13 NOTE — Progress Notes (Signed)
Called and LMOM- vit D level is in normal range.  Asked her to please call or come back in if the strange feelings she has noted persist- Sooner if worse.

## 2012-07-15 ENCOUNTER — Telehealth: Payer: Self-pay

## 2012-07-15 ENCOUNTER — Other Ambulatory Visit: Payer: Self-pay | Admitting: Family Medicine

## 2012-07-15 DIAGNOSIS — J029 Acute pharyngitis, unspecified: Secondary | ICD-10-CM

## 2012-07-15 NOTE — Telephone Encounter (Signed)
Pt needs a refill of her allergy medicine. She uses the [facility] on [address] [phone]

## 2012-07-15 NOTE — Telephone Encounter (Signed)
What is she requesting? 

## 2012-07-15 NOTE — Telephone Encounter (Signed)
Patient asking for the prednisone to be refilled, advised can not renew this one. She is advised to try the zyrtec for Allergies. She is asking also for refill on her Tessalon, please advise.

## 2012-07-16 MED ORDER — BENZONATATE 100 MG PO CAPS: 100.0000 mg | ORAL_CAPSULE | Freq: Three times a day (TID) | ORAL | Status: DC | PRN

## 2012-07-16 NOTE — Telephone Encounter (Signed)
I have refilled her tessalon perles this one time, but if she continues to cough that much she will need a recheck for any additional refills

## 2012-08-04 ENCOUNTER — Ambulatory Visit (INDEPENDENT_AMBULATORY_CARE_PROVIDER_SITE_OTHER): Payer: BC Managed Care – PPO | Admitting: Family Medicine

## 2012-08-04 VITALS — BP 120/80 | HR 80 | Temp 98.4°F | Resp 16 | Ht 63.0 in | Wt 146.0 lb

## 2012-08-04 DIAGNOSIS — R5383 Other fatigue: Secondary | ICD-10-CM

## 2012-08-04 DIAGNOSIS — R5381 Other malaise: Secondary | ICD-10-CM

## 2012-08-04 DIAGNOSIS — R52 Pain, unspecified: Secondary | ICD-10-CM

## 2012-08-04 DIAGNOSIS — N912 Amenorrhea, unspecified: Secondary | ICD-10-CM

## 2012-08-04 DIAGNOSIS — G4700 Insomnia, unspecified: Secondary | ICD-10-CM

## 2012-08-04 LAB — RHEUMATOID FACTOR: Rhuematoid fact SerPl-aCnc: <10 IU/mL (ref <=14)

## 2012-08-04 MED ORDER — CLONAZEPAM 0.5 MG PO TABS: ORAL_TABLET | ORAL | Status: DC

## 2012-08-04 NOTE — Patient Instructions (Signed)
We are going to do some more tests today to try and determine the cause of your symptoms.  You may try the clonazepam as needed in the meantime to see if it might be helpful.  I will be in touch when we get the rest of your labs in.  If you have not had a period in the next 2 weeks take another test.

## 2012-08-04 NOTE — Progress Notes (Signed)
[facility] [address] [place] [address] [phone]  Date:  08/04/2012   Name:  Paula [name]   DOB:  1971/02/19   [name]:  [id number]  PCP:  No primary provider on file.    Chief Complaint: Generalized Body Aches and Otalgia   History of Present Illness:  Paula [name] is a 42 y.o. very pleasant female patient who presents with the following:  She was here about one month ago with generalized body pains, slight earache, dry cough, throat pain.  She had a negative work- up including CMP, CBC, vitamin D level, TSH, HCG, chest x-ray and neck soft tissue x-ray.    Since she was here last month she has continued to notice that her "body gets tight and numb" all over her body.  This feeling may come and go every few minutes.  This week she has not been sleeping well.  She did not sleep well last night.  She will wake up in the night with her body feeling tight.  She states she has not been under excessive stress.  When she does not sleep well "my head is too heavy for me."    She may have a cough when she laughs. Otherwise no symptoms of acute illness.     Her LMP was on the first of April- she has not taken a home test although her menses are now about one month late She had a miscarriage in December of last year.      There are no active problems to display for this patient.   Past Medical History  Diagnosis Date  . No pertinent past medical history     Past Surgical History  Procedure Laterality Date  . No past surgeries      History  Substance Use Topics  . Smoking status: Never Smoker   . Smokeless tobacco: Never Used  . Alcohol Use: No    Family History  Problem Relation Age of Onset  . Hypertension Mother   . Hypertension Sister     No Known Allergies  Medication list has been reviewed and updated.  Current Outpatient Prescriptions on File Prior to Visit  Medication Sig Dispense Refill  . ferrous sulfate 325 (65 FE) MG tablet Take  1 tablet (325 mg total) by mouth daily.  30 tablet  0  . benzonatate (TESSALON) 100 MG capsule Take 1 capsule (100 mg total) by mouth 3 (three) times daily as needed for cough.  40 capsule  0   No current facility-administered medications on file prior to visit.    Review of Systems:  As per HPI- otherwise negative.   Physical Examination: Filed Vitals:   08/04/12 0952  BP: 120/80  Pulse: 80  Temp: 98.4 F (36.9 C)  Resp: 16   Filed Vitals:   08/04/12 0952  Height: 5\' 3"  (1.6 m)  Weight: 146 lb (66.225 kg)   Body mass index is 25.87 kg/(m^2). Ideal Body Weight: Weight in (lb) to have BMI = 25: 140.8  GEN: WDWN, NAD, Non-toxic, A & O x 3, looks well HEENT: Atraumatic, Normocephalic. Neck supple. No masses, No LAD. Bilateral TM wnl, oropharynx normal.  PEERL,EOMI.   Ears and Nose: No external deformity. CV: RRR, No M/G/R. No JVD. No thrill. No extra heart sounds. PULM: CTA B, no wheezes, crackles, rhonchi. No retractions. No resp. distress. No accessory muscle use. ABD: S, NT, ND, +BS. No rebound. No HSM. EXTR: No c/c/e NEURO Normal gait. Normal  UE and LE strength, sensation and DTR.  Normal balance PSYCH: Normally interactive. Conversant. Not depressed or anxious appearing.  Calm demeanor.   Results for orders placed in visit on 08/04/12  POCT URINE PREGNANCY      Result Value Range   Preg Test, Ur Negative       Assessment and Plan: Amenorrhea - Plan: POCT urine pregnancy  Other malaise and fatigue - Plan: POCT SEDIMENTATION RATE, C-reactive protein, ANA, Rheumatoid factor  Body aches - Plan: POCT SEDIMENTATION RATE, C-reactive protein, ANA, Rheumatoid factor  Insomnia - Plan: clonazePAM (KLONOPIN) 0.5 MG tablet  Rmani is here again with non- specific symptoms of strange pains and numbness that occur over her entire body.  She has noted this for several months now.  Admits to not sleeping well.  Suspect we will not find a cause for these symptoms.  Will do an  autoimmune w/u, but in the meantime she is willing to try a low dose of benzodiazepine to see if this may help, especially with her insomnia.   rx for clonopin, 1/2 of a 0.5 mg tablet BID as needed.    Will plan further follow- up pending labs.   Signed Abbe Amsterdam, MD

## 2012-08-05 DIAGNOSIS — N912 Amenorrhea, unspecified: Secondary | ICD-10-CM | POA: Insufficient documentation

## 2012-08-05 LAB — ANTI-NUCLEAR AB-TITER (ANA TITER): ANA Titer 1: 1:40 {titer} — ABNORMAL HIGH

## 2012-08-06 ENCOUNTER — Encounter: Payer: Self-pay | Admitting: Family Medicine

## 2012-08-14 ENCOUNTER — Ambulatory Visit (INDEPENDENT_AMBULATORY_CARE_PROVIDER_SITE_OTHER): Payer: BC Managed Care – PPO | Admitting: Family Medicine

## 2012-08-14 ENCOUNTER — Emergency Department (HOSPITAL_COMMUNITY)
Admission: EM | Admit: 2012-08-14 | Discharge: 2012-08-14 | Disposition: A | Discharge status: Home / Self Care | Payer: BC Managed Care – PPO | Attending: Emergency Medicine | Admitting: Emergency Medicine

## 2012-08-14 ENCOUNTER — Encounter (HOSPITAL_COMMUNITY): Payer: Self-pay | Admitting: Family Medicine

## 2012-08-14 VITALS — BP 118/78 | HR 101 | Temp 99.3°F | Resp 16 | Ht 62.5 in | Wt 145.4 lb

## 2012-08-14 DIAGNOSIS — Z79899 Other long term (current) drug therapy: Secondary | ICD-10-CM | POA: Insufficient documentation

## 2012-08-14 DIAGNOSIS — J029 Acute pharyngitis, unspecified: Secondary | ICD-10-CM

## 2012-08-14 DIAGNOSIS — J020 Streptococcal pharyngitis: Secondary | ICD-10-CM

## 2012-08-14 LAB — POCT RAPID STREP A (OFFICE): Rapid Strep A Screen: POSITIVE — AB

## 2012-08-14 MED ORDER — DEXAMETHASONE SODIUM PHOSPHATE 10 MG/ML IJ SOLN
10.0000 mg | Freq: Once | INTRAMUSCULAR | Status: AC
  Administered 2012-08-14: 10 mg via INTRAMUSCULAR
  Filled 2012-08-14: qty 1

## 2012-08-14 MED ORDER — KETOROLAC TROMETHAMINE 60 MG/2ML IM SOLN
60.0000 mg | Freq: Once | INTRAMUSCULAR | Status: AC
  Administered 2012-08-14: 60 mg via INTRAMUSCULAR
  Filled 2012-08-14: qty 2

## 2012-08-14 MED ORDER — PENICILLIN G BENZATHINE 1200000 UNIT/2ML IM SUSP
1.2000 10*6.[IU] | Freq: Once | INTRAMUSCULAR | Status: AC
  Administered 2012-08-14: 1.2 10*6.[IU] via INTRAMUSCULAR
  Filled 2012-08-14: qty 2

## 2012-08-14 MED ORDER — PENICILLIN V POTASSIUM 500 MG PO TABS: 500.0000 mg | ORAL_TABLET | Freq: Two times a day (BID) | ORAL | Status: DC

## 2012-08-14 NOTE — Patient Instructions (Addendum)
You have strep throat.  Rest and drink plenty of fluids, you can try lozenges and chloraseptic spray for your sore throat.  Take the penicillin- this should have you feeling better within 2 days.  If you do not feel better please let me know- Sooner if worse.

## 2012-08-14 NOTE — Progress Notes (Signed)
[facility] [address] [place] [address] [phone]  Date:  08/14/2012   Name:  Paula [name]   DOB:  Oct 03, 1970   [name]:  [id number]  PCP:  No primary provider on file.    Chief Complaint: Sore Throat and Fever   History of Present Illness:  Paula [name] is a 42 y.o. very pleasant female patient who presents with the following:  Here today with illness-  (I saw her twice last month with symtoms of generalized body "tight and numb" feeling with a negative work- up.)   She is here today with a ST- this started yesterday.   No cough. She does note chills, body aches, she has a slight fever here today.    She is not aware of any exposure to illness.   No nasal symptoms, but her ears do itch.    LMP 08/07/12 She took motrin last night, nothing since Patient Active Problem List   Diagnosis Date Noted  . Amenorrhea 08/05/2012    Past Medical History  Diagnosis Date  . No pertinent past medical history     Past Surgical History  Procedure Laterality Date  . No past surgeries      History  Substance Use Topics  . Smoking status: Never Smoker   . Smokeless tobacco: Never Used  . Alcohol Use: No    Family History  Problem Relation Age of Onset  . Hypertension Mother   . Hypertension Sister     No Known Allergies  Medication list has been reviewed and updated.  Current Outpatient Prescriptions on File Prior to Visit  Medication Sig Dispense Refill  . clonazePAM (KLONOPIN) 0.5 MG tablet Take 1/2 tablet twice a day as needed for anxiety and insomnia.  20 tablet  0  . ferrous sulfate 325 (65 FE) MG tablet Take 1 tablet (325 mg total) by mouth daily.  30 tablet  0  . benzonatate (TESSALON) 100 MG capsule Take 1 capsule (100 mg total) by mouth 3 (three) times daily as needed for cough.  40 capsule  0   No current facility-administered medications on file prior to visit.    Review of Systems:  As per HPI- otherwise negative.   Physical  Examination: Filed Vitals:   08/14/12 0914  BP: 118/78  Pulse: 101  Temp: 99.3 F (37.4 C)  Resp: 16   Filed Vitals:   08/14/12 0914  Height: 5' 2.5" (1.588 m)  Weight: 145 lb 6.4 oz (65.953 kg)   Body mass index is 26.15 kg/(m^2). Ideal Body Weight: Weight in (lb) to have BMI = 25: 138.6  GEN: WDWN, NAD, Non-toxic, A & O x 3 HEENT: Atraumatic, Normocephalic. Neck supple. No masses, No LAD. Bilateral TM wnl, oropharynx inflamed with exudate.  PEERL,EOMI.   Ears and Nose: No external deformity. CV: RRR, No M/G/R. No JVD. No thrill. No extra heart sounds. PULM: CTA B, no wheezes, crackles, rhonchi. No retractions. No resp. distress. No accessory muscle use. ABD: S, NT, ND, +BS EXTR: No c/c/e NEURO Normal gait.  PSYCH: Normally interactive. Conversant. Not depressed or anxious appearing.  Calm demeanor.   Given ibuprofen 400 mg Results for orders placed in visit on 08/14/12  POCT RAPID STREP A (OFFICE)      Result Value Range   Rapid Strep A Screen Positive (*) Negative     Assessment and Plan: Streptococcal sore throat - Plan: penicillin v potassium (VEETID) 500 MG tablet  Acute pharyngitis - Plan: POCT  rapid strep A  Treat with penicillin for strep throat.  Discussed symptomatic treatment.  Note for work. See patient instructions for more details.     Signed Abbe Amsterdam, MD

## 2012-08-14 NOTE — ED Provider Notes (Signed)
History    This chart was scribed for non-physician practitioner, Junious Silk PA-C, working with Glynn Octave, MD by Donne Anon, ED Scribe. This patient was seen in room WTR4/WLPT4 and the patient's care was started at 2117.    CSN: [id number]  Arrival date & time 08/14/12  2046   First MD Initiated Contact with Patient 08/14/12 2117      Chief Complaint  Patient presents with  . Sore Throat     The history is provided by the patient. No language interpreter was used.   HPI Comments: Paula [name] is a 42 y.o. female who presents to the Emergency Department complaining of 2 days of gradual onset, gradually worsening, constant sore throat. She was seen at urgent Care on [address] today and diagnosed with strep throat. She was started on an antibiotic and Motrin but states the pain is worsening. She reports associated HA and mild abdominal pain. She states swallowing makes the pain worse. No trouble breathing. She states her pain is severe and would like a medication drip. She denies cough, nausea, vomiting or any other pain.    Past Medical History  Diagnosis Date  . No pertinent past medical history     Past Surgical History  Procedure Laterality Date  . No past surgeries      Family History  Problem Relation Age of Onset  . Hypertension Mother   . Hypertension Sister     History  Substance Use Topics  . Smoking status: Never Smoker   . Smokeless tobacco: Never Used  . Alcohol Use: No    OB History   Grav Para Term Preterm Abortions TAB SAB Ect Mult Living   4 3 3       3       Review of Systems  HENT: Positive for sore throat.   Respiratory: Negative for cough.   Gastrointestinal: Positive for abdominal pain. Negative for nausea and vomiting.  Neurological: Positive for headaches.  All other systems reviewed and are negative.    Allergies  Review of patient's allergies indicates no known allergies.  Home Medications   Current Outpatient Rx  Name   Route  Sig  Dispense  Refill  . clonazePAM (KLONOPIN) 0.5 MG tablet      Take 1/2 tablet twice a day as needed for anxiety and insomnia.   20 tablet   0   . ferrous sulfate 325 (65 FE) MG tablet   Oral   Take 1 tablet (325 mg total) by mouth daily.   30 tablet   0   . ibuprofen (ADVIL,MOTRIN) 200 MG tablet   Oral   Take 400 mg by mouth every 6 (six) hours as needed for pain.         . Multiple Vitamin (MULTIVITAMIN WITH MINERALS) TABS   Oral   Take 1 tablet by mouth daily.         . penicillin v potassium (VEETID) 500 MG tablet   Oral   Take 1 tablet (500 mg total) by mouth 2 (two) times daily.   20 tablet   0     BP 139/71  Pulse 97  Temp(Src) 100.9 F (38.3 C) (Oral)  Resp 18  SpO2 99%  LMP 08/07/2012  Physical Exam  Nursing note and vitals reviewed. Constitutional: She is oriented to person, place, and time. She appears well-developed and well-nourished. No distress.  HENT:  Head: Normocephalic and atraumatic. No trismus in the jaw.  Right Ear: Tympanic membrane and external ear  normal.  Left Ear: Tympanic membrane and external ear normal.  Nose: Nose normal.  Mouth/Throat: Oropharyngeal exudate present.  Tonsils enlarged but equal bilaterally with exudate. No submental edema. High riding tongue but no elevation. No trismus.  Eyes: Conjunctivae are normal.  Neck: Normal range of motion.  Cardiovascular: Normal rate, regular rhythm and normal heart sounds.   Pulmonary/Chest: Effort normal and breath sounds normal. No stridor. No respiratory distress. She has no wheezes. She has no rales.  Abdominal: Soft. She exhibits no distension.  Musculoskeletal: Normal range of motion.  Neurological: She is alert and oriented to person, place, and time. She has normal strength.  Skin: Skin is warm and dry. She is not diaphoretic. No erythema.  Psychiatric: She has a normal mood and affect. Her behavior is normal.    ED Course  Procedures (including critical care  time) DIAGNOSTIC STUDIES: Oxygen Saturation is 99% on RA, normal by my interpretation.    COORDINATION OF CARE: 9:57 PM Discussed treatment plan which includes continuing antibiotic treatment as well as pain medication with pt at bedside and pt agreed to plan. Advised of importance of continuing antibiotic.   10:14 PM - The case was discussed with Dr. Manus Gunning.    Labs Reviewed - No data to display No results found.   1. Strep pharyngitis       MDM  Patient diagnosed with strep throat earlier today. No need for repeat culture. Tonsils enlarged without asymmetry. No concern for impending obstruction. No concern for peritonsillar abscess. Given bicillin injection, decadron, and Toradol. Discussed case with Dr. Manus Gunning who agrees with plan. Return instructions given. Vital signs stable for discharge. Patient / Family / Caregiver informed of clinical course, understand medical decision-making process, and agree with plan.   I personally performed the services described in this documentation, which was scribed in my presence. The recorded information has been reviewed and is accurate.         Mora Bellman, PA-C 08/15/12 1711

## 2012-08-14 NOTE — ED Notes (Signed)
Patient states that she was seen at Urgent [facility] on [address] and diagnosed with strep throat. Started on antibiotics but states that her throat is much worse. Pain is worse. Tonsillar exudate, erythema and edema noted.

## 2012-08-16 NOTE — ED Provider Notes (Signed)
Medical screening examination/treatment/procedure(s) were performed by non-physician practitioner and as supervising physician I was immediately available for consultation/collaboration.   Glynn Octave, MD 08/16/12 [phone]

## 2012-09-12 ENCOUNTER — Ambulatory Visit (INDEPENDENT_AMBULATORY_CARE_PROVIDER_SITE_OTHER): Payer: BC Managed Care – PPO | Admitting: Family Medicine

## 2012-09-12 VITALS — BP 130/88 | HR 78 | Temp 98.3°F | Resp 16 | Ht 62.5 in | Wt 149.0 lb

## 2012-09-12 DIAGNOSIS — Z862 Personal history of diseases of the blood and blood-forming organs and certain disorders involving the immune mechanism: Secondary | ICD-10-CM

## 2012-09-12 DIAGNOSIS — Z0000 Encounter for general adult medical examination without abnormal findings: Secondary | ICD-10-CM

## 2012-09-12 LAB — LIPID PANEL
HDL: 62 mg/dL (ref >39)
LDL Cholesterol: 90 mg/dL (ref 0–99)
Total CHOL/HDL Ratio: 2.7 Ratio
VLDL: 13 mg/dL (ref 0–40)

## 2012-09-12 LAB — POCT CBC
HCT, POC: 38.5 % (ref 37.7–47.9)
Hemoglobin: 11.5 g/dL — AB (ref 12.2–16.2)
Lymph, poc: 1.7 (ref 0.6–3.4)
MCH, POC: 27.6 pg (ref 27–31.2)
MCHC: 29.9 g/dL — AB (ref 31.8–35.4)
MCV: 92.6 fL (ref 80–97)
RBC: 4.16 M/uL (ref 4.04–5.48)
WBC: 4.1 10*3/uL — AB (ref 4.6–10.2)

## 2012-09-12 LAB — FERRITIN: Ferritin: 22 ng/mL (ref 10–291)

## 2012-09-12 NOTE — Progress Notes (Signed)
[facility] [address], [place] [address] [phone]  Date:  09/12/2012   Name:  Paula [name]   DOB:  05/28/1970   [name]:  [id number]  PCP:  No primary provider on file.    Chief Complaint: Annual Exam   History of Present Illness:  Paula [name] is a 42 y.o. very pleasant female patient who presents with the following:  Here today for a CPE.   Her LMP was 09/05/12,  Her menses are again regular.   She is fasting today for labs.  She had a pap about one year ago per another UC- per her report it was normal.   She has never had an abnormal pap smear.  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 She is still taking iron daily- 325 mg  She had 3 school age children.  She is not married but is in a long term relationship with a female partner Her last tetanus shot was last year.  She works in housekeeping She has not yet had a mammogram.  She does not smoke or use drugs/ alcohol  Patient Active Problem List   Diagnosis Date Noted  . Amenorrhea 08/05/2012    Past Medical History  Diagnosis Date  . No pertinent past medical history   . Anemia     Past Surgical History  Procedure Laterality Date  . No past surgeries      History  Substance Use Topics  . Smoking status: Never Smoker   . Smokeless tobacco: Never Used  . Alcohol Use: No    Family History  Problem Relation Age of Onset  . Hypertension Mother   . Hypertension Sister     No Known Allergies  Medication list has been reviewed and updated.  Current Outpatient Prescriptions on File Prior to Visit  Medication Sig Dispense Refill  . ferrous sulfate 325 (65 FE) MG tablet Take 1 tablet (325 mg total) by mouth daily.  30 tablet  0  . ibuprofen (ADVIL,MOTRIN) 200 MG tablet Take 400 mg by mouth every 6 (six) hours as needed for pain.       No current facility-administered medications on file prior to visit.    Review of Systems:  As per HPI- otherwise  negative.   Physical Examination: Filed Vitals:   09/12/12 0950  BP: 136/88  Pulse: 78  Temp: 98.3 F (36.8 C)  Resp: 16   Filed Vitals:   09/12/12 0950  Height: 5' 2.5" (1.588 m)  Weight: 149 lb (67.586 kg)   Body mass index is 26.8 kg/(m^2). Ideal Body Weight: Weight in (lb) to have BMI = 25: 138.6  GEN: WDWN, NAD, Non-toxic, A & O x 3, mild overweight, looks well HEENT: Atraumatic, Normocephalic. Neck supple. No masses, No LAD.  Bilateral TM wnl, oropharynx normal.  PEERL,EOMI.   Ears and Nose: No external deformity. CV: RRR, No M/G/R. No JVD. No thrill. No extra heart sounds. PULM: CTA B, no wheezes, crackles, rhonchi. No retractions. No resp. distress. No accessory muscle use. ABD: S, NT, ND, +BS. No rebound. No HSM. EXTR: No c/c/e NEURO Normal gait.  PSYCH: Normally interactive. Conversant. Not depressed or anxious appearing.  Calm demeanor.  Breast: normal exam, no masses, discharge or dimpling Gu: normal exam. No lesions or masses, no CMT, no adnexal changes.    Results for orders placed in visit on 09/12/12  POCT CBC      Result Value Range   WBC  4.1 (*) 4.6 - 10.2 K/uL   Lymph, poc 1.7  0.6 - 3.4   POC LYMPH PERCENT 41.2  10 - 50 %L   MID (cbc) 0.4  0 - 0.9   POC MID % 10.0  0 - 12 %M   POC Granulocyte 2.0  2 - 6.9   Granulocyte percent 48.8  37 - 80 %G   RBC 4.16  4.04 - 5.48 M/uL   Hemoglobin 11.5 (*) 12.2 - 16.2 g/dL   HCT, POC 81.1  91.4 - 47.9 %   MCV 92.6  80 - 97 fL   MCH, POC 27.6  27 - 31.2 pg   MCHC 29.9 (*) 31.8 - 35.4 g/dL   RDW, POC 78.2     Platelet Count, POC 241  142 - 424 K/uL   MPV 8.2  0 - 99.8 fL     Assessment and Plan: Physical exam - Plan: POCT CBC, Lipid panel, Pap IG w/ reflex to HPV when ASC-U  History of anemia - Plan: Ferritin  CPE for age.  See pt instructions.  Will plan further follow- up pending labs.   Signed Abbe Amsterdam, MD

## 2012-09-12 NOTE — Patient Instructions (Addendum)
Remember to schedule a mammogram this year.    [facility] Address: [address] [place], [place], [place] [address] [phone]  I will be in touch with your lab results when they come in.  Take care!

## 2012-09-17 ENCOUNTER — Encounter (HOSPITAL_COMMUNITY): Payer: Self-pay | Admitting: Emergency Medicine

## 2012-09-17 ENCOUNTER — Emergency Department (HOSPITAL_COMMUNITY)
Admission: EM | Admit: 2012-09-17 | Discharge: 2012-09-18 | Disposition: A | Discharge status: Home / Self Care | Payer: BC Managed Care – PPO | Attending: Emergency Medicine | Admitting: Emergency Medicine

## 2012-09-17 DIAGNOSIS — D649 Anemia, unspecified: Secondary | ICD-10-CM | POA: Insufficient documentation

## 2012-09-17 DIAGNOSIS — R51 Headache: Secondary | ICD-10-CM | POA: Insufficient documentation

## 2012-09-17 DIAGNOSIS — K089 Disorder of teeth and supporting structures, unspecified: Secondary | ICD-10-CM | POA: Insufficient documentation

## 2012-09-17 DIAGNOSIS — K0889 Other specified disorders of teeth and supporting structures: Secondary | ICD-10-CM

## 2012-09-17 MED ORDER — OXYCODONE-ACETAMINOPHEN 5-325 MG PO TABS
2.0000 | ORAL_TABLET | Freq: Once | ORAL | Status: AC
  Administered 2012-09-18: 2 via ORAL
  Filled 2012-09-17: qty 2

## 2012-09-17 NOTE — ED Notes (Signed)
Pt is c/o toothache on the bottom left  Pt states pain started yesterday  Pt states the pain radiates up into her head

## 2012-09-18 MED ORDER — PENICILLIN V POTASSIUM 500 MG PO TABS: 500.0000 mg | ORAL_TABLET | Freq: Four times a day (QID) | ORAL | Status: AC

## 2012-09-18 MED ORDER — OXYCODONE-ACETAMINOPHEN 5-325 MG PO TABS: 1.0000 | ORAL_TABLET | Freq: Four times a day (QID) | ORAL | Status: DC | PRN

## 2012-09-18 NOTE — ED Provider Notes (Signed)
Medical screening examination/treatment/procedure(s) were performed by non-physician practitioner and as supervising physician I was immediately available for consultation/collaboration.  John-Adam Yarixa Lightcap, M.D.  John-Adam Jonda Alanis, MD 09/18/12 0644 

## 2012-09-18 NOTE — ED Provider Notes (Signed)
History    CSN: [id number] Arrival date & time 09/17/12  2304  First MD Initiated Contact with Patient 09/17/12 2338     Chief Complaint  Patient presents with  . Dental Pain   (Consider location/radiation/quality/duration/timing/severity/associated sxs/prior Treatment) HPI Comments: Patient presents to the emergency department with a dental complaint. Symptoms began yesterday. The patient has tried to alleviate pain with ibuprofen.  Pain rated at a 10/10, characterized as throbbing in nature and located top and bottom left, back. Patient denies fever, night sweats, chills, difficulty swallowing or opening mouth, SOB, nuchal rigidity or decreased ROM of neck.  Patient does not have a dentist and requests a resource guide at discharge.    Patient is a 42 y.o. female presenting with tooth pain.  Dental Pain Associated symptoms: headaches   Associated symptoms: no drooling, no fever and no neck pain    Past Medical History  Diagnosis Date  . No pertinent past medical history   . Anemia    Past Surgical History  Procedure Laterality Date  . No past surgeries     Family History  Problem Relation Age of Onset  . Hypertension Mother   . Hypertension Sister    History  Substance Use Topics  . Smoking status: Never Smoker   . Smokeless tobacco: Never Used  . Alcohol Use: No   OB History   Grav Para Term Preterm Abortions TAB SAB Ect Mult Living   4 3 3       3      Review of Systems  Constitutional: Negative for fever and diaphoresis.  HENT: Positive for dental problem. Negative for drooling, trouble swallowing, neck pain and neck stiffness.        Upper and lower left  Eyes: Negative for visual disturbance.  Respiratory: Negative for apnea, chest tightness and shortness of breath.   Cardiovascular: Negative for chest pain and palpitations.  Gastrointestinal: Negative for nausea, vomiting, diarrhea and constipation.  Genitourinary: Negative for dysuria.  Musculoskeletal:  Negative for gait problem.  Skin: Negative for rash.  Neurological: Positive for headaches. Negative for dizziness, weakness, light-headedness and numbness.    Allergies  Review of patient's allergies indicates no known allergies.  Home Medications   Current Outpatient Rx  Name  Route  Sig  Dispense  Refill  . ferrous sulfate 325 (65 FE) MG tablet   Oral   Take 1 tablet (325 mg total) by mouth daily.   30 tablet   0   . ibuprofen (ADVIL,MOTRIN) 200 MG tablet   Oral   Take 400 mg by mouth every 6 (six) hours as needed for pain.          BP 129/81  Pulse 69  Temp(Src) 98.7 F (37.1 C) (Oral)  Resp 16  SpO2 100%  LMP 09/05/2012 Physical Exam  Nursing note and vitals reviewed. Constitutional: She is oriented to person, place, and time. She appears well-developed and well-nourished. No distress.  HENT:  Head: Normocephalic and atraumatic. No trismus in the jaw.  Mouth/Throat: No dental abscesses. No posterior oropharyngeal erythema or tonsillar abscesses.    Oropharynx is clear. Very tender at tooth #14 and tooth #19 with erythema and edema of the underlying gingiva. Generally good dentition.  Neck is nontender and supple without adenopathy or JVD.  Eyes: Conjunctivae and EOM are normal.  Neck: Normal range of motion. Neck supple.  No meningeal signs  Cardiovascular: Normal rate, regular rhythm and normal heart sounds.  Exam reveals no gallop and no  friction rub.   No murmur heard. Pulmonary/Chest: Effort normal and breath sounds normal. No respiratory distress. She has no wheezes. She has no rales. She exhibits no tenderness.  Abdominal: Soft. Bowel sounds are normal. She exhibits no distension. There is no tenderness. There is no rebound and no guarding.  Musculoskeletal: Normal range of motion. She exhibits no edema and no tenderness.  Neurological: She is alert and oriented to person, place, and time. No cranial nerve deficit.  Skin: Skin is warm and dry. She is not  diaphoretic. No erythema.  Psychiatric: She has a normal mood and affect.    ED Course  Procedures (including critical care time) Labs Reviewed - No data to display No results found. 1. Pain, dental     MDM  Patient with dental pain, pain radiating up the trigeminal nerve, expressing pain at the mandibular, maxillary, and opthalmic zones.  No gross abscess.  Exam unconcerning for Ludwig's angina or spread of infection.  Will treat with penicillin and pain medicine.  Urged patient to follow-up with dentist.  Provided resource material. Discussed options for treatment with pt who understands and is in agreement with discharge plan.    Glade Nurse, PA-C 09/18/12 0025

## 2012-09-19 LAB — PAP IG W/ RFLX HPV ASCU

## 2012-09-21 ENCOUNTER — Encounter: Payer: Self-pay | Admitting: Family Medicine

## 2012-09-21 NOTE — Addendum Note (Signed)
Addended by: Abbe Amsterdam C on: 09/21/2012 03:22 PM   Modules accepted: Orders

## 2012-09-24 ENCOUNTER — Telehealth: Payer: Self-pay

## 2012-09-24 NOTE — Telephone Encounter (Signed)
Patient was seen for a physical recently and would like to know her lab results. [phone]

## 2012-09-24 NOTE — Telephone Encounter (Signed)
Your pap looks fine- see attached. Your iron level (ferritin) is ok, and your anemia is much better.  You may continue to take your iron supplement, but at this point you can start taking it just every other day.  Let's check your blood count again in 3 months to check your hemoglobin and white blood cell count- I will leave an order on your chart.  You can have this rechecked as a lab visit only.  Your cholesterol looks good!  Letter was mailed to her, she has not gotten, above is from letter. Called her to advise.

## 2012-11-26 ENCOUNTER — Telehealth: Payer: Self-pay

## 2012-11-26 DIAGNOSIS — R5381 Other malaise: Secondary | ICD-10-CM

## 2012-11-26 NOTE — Telephone Encounter (Signed)
Pended referral please advise.  

## 2012-11-26 NOTE — Telephone Encounter (Signed)
Dr. Patsy Lager:  This patient says she talked to you several months ago regarding a sleep study and she needs [place] to set up a referral for that.  Her number is [phone].

## 2012-12-10 ENCOUNTER — Ambulatory Visit (INDEPENDENT_AMBULATORY_CARE_PROVIDER_SITE_OTHER): Payer: BC Managed Care – PPO | Admitting: Neurology

## 2012-12-10 ENCOUNTER — Encounter: Payer: Self-pay | Admitting: Neurology

## 2012-12-10 VITALS — BP 126/85 | HR 69 | Temp 98.9°F | Ht 62.25 in | Wt 151.0 lb

## 2012-12-10 DIAGNOSIS — G2581 Restless legs syndrome: Secondary | ICD-10-CM

## 2012-12-10 DIAGNOSIS — G478 Other sleep disorders: Secondary | ICD-10-CM

## 2012-12-10 DIAGNOSIS — R400 Somnolence: Secondary | ICD-10-CM

## 2012-12-10 DIAGNOSIS — G4710 Hypersomnia, unspecified: Secondary | ICD-10-CM

## 2012-12-10 DIAGNOSIS — R0609 Other forms of dyspnea: Secondary | ICD-10-CM

## 2012-12-10 DIAGNOSIS — G479 Sleep disorder, unspecified: Secondary | ICD-10-CM

## 2012-12-10 DIAGNOSIS — R0683 Snoring: Secondary | ICD-10-CM

## 2012-12-10 NOTE — Progress Notes (Signed)
Subjective:    Paula [name] ID: Paula Paula [name] is a 42 y.o. female.  HPI  Paula Foley, MD, PhD [facility] [address] [address] [place], [place] [address]  Dear Dr. Patsy [name],  I saw your Paula [name], Paula Paula [name], upon your kind request in my neurologic clinic today for initial consultation of Paula Paula [name] sleep disorder, in particular, concern for obstructive sleep apnea. Paula Paula [name] is unaccompanied today. As you know, Paula Paula [name] is a very pleasant 42 year old right-handed woman with an underlying medical history of anemia who has been suffering from daytime tiredness for Paula past year. She has been having trouble to go to sleep and it takes Paula Paula [name] 1-2 hours to fall asleep. She snores mildly, but has not been reported to have apneas and denies gasping sensations while asleep. She is very frustrated about not being able to sleep and is considering to go back to [place] with Paula Paula [name] BF and Paula Paula [name] 3 children. She is worried about getting sick from not sleeping well. She denies AM HAs but feels poorly rested. Paula Paula [name] ESS is 8/24.  She goes to bed by 9 PM and has to get up at 4 AM as she has to be at work by 5 PM. She works in housekeeping at [employer]. She tries to exercise on Paula WE or after work. She feels restless and has aches in Paula Paula [name] legs and whenever she is on Paula verge of falling asleep, she feels an inside pain in Paula Paula [name] body. She feels like moving Paula Paula [name] body and Paula Paula [name] legs, which helps. Paula Paula [name] BF does not report that she kicks in Paula Paula [name] sleep. She is not aware of any FHx of sleep disorders. She does endorse some stressors, but nothing new.  She is a restless sleeper and in Paula morning, Paula bed is quite disheveled.   She denies cataplexy, sleep paralysis, hypnagogic or hypnopompic hallucinations, or sleep attacks. She does not report any vivid dreams, nightmares, dream enactments, or parasomnias, such as sleep talking or sleep walking. Paula Paula [name] has not had a sleep study or a home sleep test.  She consumes  no caffeinated beverages per day typically.  Paula Paula [name] bedroom is usually dark and cool. There is no TV in Paula bedroom.   Paula Paula [name] Past Medical History Is Significant For: Past Medical History  Diagnosis Date  . No pertinent past medical history   . Anemia     Paula Paula [name] Past Surgical History Is Significant For: Past Surgical History  Procedure Laterality Date  . No past surgeries      Paula Paula [name] Family History Is Significant For: Family History  Problem Relation Age of Onset  . Hypertension Mother   . Hypertension Sister     Paula Paula [name] Social History Is Significant For: History   Social History  . Marital Status: Single    Spouse Name: N/A    Number of Children: 3  . Years of Education: 12   Occupational History  . [employer]    Social History Main Topics  . Smoking status: Never Smoker   . Smokeless tobacco: Never Used  . Alcohol Use: No  . Drug Use: No  . Sexual Activity: Not Currently   Other Topics Concern  . None   Social History Narrative  . None    Paula Paula [name] Allergies Are:  No Known Allergies:   Paula Paula [name] Current Medications Are:  Outpatient Encounter Prescriptions as of 12/10/2012  Medication Sig Dispense Refill  . ferrous sulfate 325 (65 FE) MG tablet Take 325 mg by mouth as needed.      Marland Kitchen  ibuprofen (ADVIL,MOTRIN) 200 MG tablet Take 400 mg by mouth every 6 (six) hours as needed for pain.      . [DISCONTINUED] ferrous sulfate 325 (65 FE) MG tablet Take 1 tablet (325 mg total) by mouth daily.  30 tablet  0  . [DISCONTINUED] oxyCODONE-acetaminophen (PERCOCET) 5-325 MG per tablet Take 1 tablet by mouth every 6 (six) hours as needed for pain.  16 tablet  0   No facility-administered encounter medications on file as of 12/10/2012.  :  Review of Systems:  Out of a complete 14 point review of systems, all are reviewed and negative with Paula exception of these symptoms as listed below:  Review of Systems  Psychiatric/Behavioral: Positive for sleep disturbance.       Not enough sleep sleepiness     Objective:  Neurologic Exam  Physical Exam Physical Examination:   Filed Vitals:   12/10/12 0839  BP: 126/85  Pulse: 69  Temp: 98.9 F (37.2 C)    General Examination: Paula Paula [name] is a very pleasant 42 y.o. female in no acute distress. She appears well-developed and well-nourished and very well groomed.   HEENT: Normocephalic, atraumatic, pupils are equal, round and reactive to light and accommodation. Funduscopic exam is normal with sharp disc margins noted. Extraocular tracking is good without limitation to gaze excursion or nystagmus noted. Normal smooth pursuit is noted. Hearing is grossly intact. Tympanic membranes are clear bilaterally. Face is symmetric with normal facial animation and normal facial sensation. Speech is clear with no dysarthria noted. There is no hypophonia. There is no lip, neck/head, jaw or voice tremor. Neck is supple with full range of passive and active motion. There are no carotid bruits on auscultation. Oropharynx exam reveals: mild mouth dryness, good dental hygiene and moderate airway crowding, due to elongated tongue, tonsillar size of 2+ and redundant soft palate. Mallampati is class III. Tongue protrudes centrally and palate elevates symmetrically. Neck size is 13.25 inches.   Chest: Clear to auscultation without wheezing, rhonchi or crackles noted.  Heart: S1+S2+0, regular and normal without murmurs, rubs or gallops noted.   Abdomen: Soft, non-tender and non-distended with normal bowel sounds appreciated on auscultation.  Extremities: There is no pitting edema in Paula distal lower extremities bilaterally. Pedal pulses are intact.  Skin: Warm and dry without trophic changes noted. There are no varicose veins.  Musculoskeletal: exam reveals no obvious joint deformities, tenderness or joint swelling or erythema.   Neurologically:  Mental status: Paula Paula [name] is awake, alert and oriented in all 4 spheres. Paula Paula [name] memory, attention, language and  knowledge are appropriate. There is no aphasia, agnosia, apraxia or anomia. Speech is clear with normal prosody and enunciation. Thought process is linear. Mood is congruent and affect is normal.  Cranial nerves are as described above under HEENT exam. In addition, shoulder shrug is normal with equal shoulder height noted. Motor exam: Normal bulk, strength and tone is noted. There is no drift, tremor or rebound. Romberg is negative. Reflexes are 2+ throughout. Toes are downgoing bilaterally. Fine motor skills are intact with normal finger taps, normal hand movements, normal rapid alternating patting, normal foot taps and normal foot agility.  Cerebellar testing shows no dysmetria or intention tremor on finger to nose testing. Heel to shin is unremarkable bilaterally. There is no truncal or gait ataxia.  Sensory exam is intact to light touch, pinprick, vibration, temperature sense and proprioception in Paula upper and lower extremities.  Gait, station and balance are unremarkable. No veering to one side  is noted. No leaning to one side is noted. Posture is age-appropriate and stance is narrow based. No problems turning are noted. She turns en bloc. Tandem walk is unremarkable. Intact toe and heel stance is noted.               Assessment and Plan:    In summary, Aveen Beam is a very pleasant 42 y.o.-year old female with a history of sleep disturbance which includes findings concerning for RLS and some concern for OSA, given Paula Paula [name] Hx of snoring and daytime tiredness and tighter looking airway. She is overweight. She has day to day stress, but no major new stressors. I suggested we proceed with a sleep study and take it from there. I had a long chat with Paula Paula [name] regarding sleep onset problems and contributors. I explained good sleep hygiene to Paula Paula [name] and for Paula most part, she has good sleep habits and I explained Paula risks and ramifications of untreated moderate to severe OSA, especially with respect to  developing cardiovascular disease down Paula Road, including congestive heart failure, difficult to treat hypertension, cardiac arrhythmias, or stroke. Even type 2 diabetes has in part been linked to untreated OSA. We talked about trying to maintain a healthy lifestyle in general, as well as Paula importance of weight control. I encouraged Paula Paula [name] to eat healthy, exercise daily and keep well hydrated, to keep a scheduled bedtime and wake time routine, to not skip any meals and eat healthy snacks in between meals.  I recommended Paula following at this time: sleep study with potential positive airway pressure titration.  I explained Paula sleep test procedure to Paula Paula [name] and also outlined possible surgical and non-surgical treatment options of OSA, including Paula use of a custom-made dental device, upper airway surgical options, such as pillar implants, radiofrequency surgery, tongue base surgery, and UPPP. I also explained Paula CPAP treatment option to Paula Paula [name], who indicated that she would be willing to try CPAP if Paula need arises. I explained Paula importance of being compliant with PAP treatment, not only for insurance purposes but primarily to improve Paula Paula [name] symptoms, and for Paula Paula [name]'s long term health benefit, including to reduce Paula Paula [name] cardiovascular risks. I answered all Paula Paula [name] questions today and Paula Paula [name] was in agreement. I would like to see Paula Paula [name] back after Paula sleep study is completed and encouraged Paula Paula [name] to call with any interim questions, concerns, problems or updates.   Thank you very much for allowing me to participate in Paula care of this nice Paula [name]. If I can be of any further assistance to you please do not hesitate to call me at [phone].  Sincerely,   Paula Foley, MD, PhD

## 2012-12-10 NOTE — Patient Instructions (Signed)

## 2013-01-11 ENCOUNTER — Telehealth: Payer: Self-pay | Admitting: *Deleted

## 2013-01-11 NOTE — Telephone Encounter (Signed)
Called patient and left message.  I received a call from answering service Saturday 01/10/13, I was not able to pick up the message until 11:00 PM.  When I contacted them they explained that Fred had come to the sleep lab for her appointment and no one was here and she was very upset.  Her appointment is actually scheduled for Saturday 01/17/13 at 8:00 PM.  I left message for patient and apologized for any confusion, explained that I was unavailable to contact her sooner last night and didn't want to call at 11 PM.  I explained that her appointment was actually scheduled for next Saturday 01/17/13 at 8:00 PM.  I explained that we had to make some special scheduling arrangements for a Saturday appointment and the first available we could do this was 01/17/13 (which she and I had discussed previously, she may have written the appt on the wrong date).  I told her I would look forward to seeing her next Saturday 01/17/13 at 8:00 PM and that if she has any questions or concerns in the meantime, to please contact our office at [phone].

## 2013-01-17 ENCOUNTER — Ambulatory Visit (INDEPENDENT_AMBULATORY_CARE_PROVIDER_SITE_OTHER): Payer: BC Managed Care – PPO | Admitting: Neurology

## 2013-01-17 DIAGNOSIS — G2581 Restless legs syndrome: Secondary | ICD-10-CM

## 2013-01-17 DIAGNOSIS — G4733 Obstructive sleep apnea (adult) (pediatric): Secondary | ICD-10-CM

## 2013-01-17 DIAGNOSIS — G4761 Periodic limb movement disorder: Secondary | ICD-10-CM

## 2013-01-17 DIAGNOSIS — R0683 Snoring: Secondary | ICD-10-CM

## 2013-01-17 DIAGNOSIS — R400 Somnolence: Secondary | ICD-10-CM

## 2013-01-17 DIAGNOSIS — G479 Sleep disorder, unspecified: Secondary | ICD-10-CM

## 2013-01-17 DIAGNOSIS — G4700 Insomnia, unspecified: Secondary | ICD-10-CM

## 2013-01-17 DIAGNOSIS — G478 Other sleep disorders: Secondary | ICD-10-CM

## 2013-01-23 NOTE — Sleep Study (Signed)
See media tab for full report   Patient arrived at sleep lab and was setup per [facility] standards.  Due to poor sleep efficiency and a low AHI, patient wasn't split but study ended as a diagnostic test.

## 2013-01-30 ENCOUNTER — Telehealth: Payer: Self-pay | Admitting: Neurology

## 2013-01-30 NOTE — Telephone Encounter (Signed)
I called and spoke with the patient about her recent sleep study results and informed her that the study did not show any significant obstructive sleep apnea, but there was insomnia and mild leg kicking. I informed the patient that Dr. Frances Furbish would like to discuss in detail her results. Patient understood and is scheduled for 03-19-13. I informed the patient that I will mail her a copy of the report and fax Dr. Cyndie Chime office a copy of the report.

## 2013-01-30 NOTE — Telephone Encounter (Signed)
Please call and notify the patient that the recent sleep study did not show any significant obstructive sleep apnea, but there was insomnia and mild leg kicking, PLMs. Please inform patient that I would like to go over the details of the study during a follow up appointment and if not already previously scheduled, arrange a followup appointment (please utilize a followu-up slot). Also, route or fax report to PCP and referring MD, if other than PCP.  Once you have spoken to patient, you can close this encounter.   Thanks,  Huston Foley, MD, PhD [facility])

## 2013-02-02 ENCOUNTER — Encounter: Payer: Self-pay | Admitting: *Deleted

## 2013-03-19 ENCOUNTER — Ambulatory Visit: Payer: BC Managed Care – PPO | Admitting: Neurology

## 2013-03-19 ENCOUNTER — Telehealth: Payer: Self-pay | Admitting: Neurology

## 2013-03-19 NOTE — Telephone Encounter (Signed)
PT called in and stated that she would not be able to make appt as her car wouldn't start.  She also stated that she was very confused about the sleep study she just had and would like someone to contact her to see if she needs to come back in for another study.  States she is still having problems sleeping.  Please contact.

## 2013-03-19 NOTE — Telephone Encounter (Signed)
Spoke to the patient who had concerns that she did not sleep well enough for [place] to have obtained a valid sleep study.  Advised the patient that her sleep study did reveal documented sleep but the test indicated little to no findings of sleep apnea.  She complains of still having trouble sleeping in which it was emphasized the importance of rescheduling her follow up appointment with Dr. Rexene Alberts to discuss the results of the study in detail and any recommendations, treatment options deemed necessary.

## 2013-05-14 ENCOUNTER — Other Ambulatory Visit: Payer: Self-pay | Admitting: Gynecology

## 2013-05-14 DIAGNOSIS — R928 Other abnormal and inconclusive findings on diagnostic imaging of breast: Secondary | ICD-10-CM

## 2013-05-26 ENCOUNTER — Ambulatory Visit
Admission: RE | Admit: 2013-05-26 | Discharge: 2013-05-26 | Disposition: A | Discharge status: Home / Self Care | Payer: BC Managed Care – PPO | Source: Ambulatory Visit | Attending: Gynecology | Admitting: Gynecology

## 2013-05-26 DIAGNOSIS — R928 Other abnormal and inconclusive findings on diagnostic imaging of breast: Secondary | ICD-10-CM

## 2013-06-18 ENCOUNTER — Ambulatory Visit (INDEPENDENT_AMBULATORY_CARE_PROVIDER_SITE_OTHER): Payer: BC Managed Care – PPO | Admitting: Family Medicine

## 2013-06-18 VITALS — BP 120/80 | HR 95 | Temp 98.0°F | Resp 16 | Ht 62.5 in | Wt 151.2 lb

## 2013-06-18 DIAGNOSIS — R0981 Nasal congestion: Secondary | ICD-10-CM

## 2013-06-18 DIAGNOSIS — J3489 Other specified disorders of nose and nasal sinuses: Secondary | ICD-10-CM

## 2013-06-18 DIAGNOSIS — R6889 Other general symptoms and signs: Secondary | ICD-10-CM

## 2013-06-18 DIAGNOSIS — H579 Unspecified disorder of eye and adnexa: Secondary | ICD-10-CM

## 2013-06-18 DIAGNOSIS — J302 Other seasonal allergic rhinitis: Secondary | ICD-10-CM

## 2013-06-18 DIAGNOSIS — J309 Allergic rhinitis, unspecified: Secondary | ICD-10-CM

## 2013-06-18 MED ORDER — MONTELUKAST SODIUM 10 MG PO TABS: 10.0000 mg | ORAL_TABLET | Freq: Every day | ORAL | Status: DC

## 2013-06-18 NOTE — Progress Notes (Signed)
Chief Complaint:  Chief Complaint  Patient presents with  . itchy/watery eyes    symptoms x 1 week   . drainnage  . Sore Throat  . congestion  . itchy ears    x several weeks     HPI: Paula [name] is a 43 y.o. female who is here for  1-2 week history of allergy sxs, every year she comes in during this time with similar sxs. Nasal congestion,  Itchy eyes. She has had no fevers or chills.  She has tried flonase in the past without releif. She has been on Anefrin otc and zyrtec and is getting minimal relief. In the past she has had to have steroids. She is from [place] and did not have allergies until she came her to the [place].   Past Medical History  Diagnosis Date  . Anemia   . Allergy    Past Surgical History  Procedure Laterality Date  . No past surgeries     History   Social History  . Marital Status: Single    Spouse Name: N/A    Number of Children: 3  . Years of Education: 12   Occupational History  . [employer]    Social History Main Topics  . Smoking status: Never Smoker   . Smokeless tobacco: Never Used  . Alcohol Use: No  . Drug Use: No  . Sexual Activity: Not Currently   Other Topics Concern  . None   Social History Narrative  . None   Family History  Problem Relation Age of Onset  . Hypertension Mother   . Hypertension Sister    No Known Allergies Prior to Admission medications   Medication Sig Start Date End Date Taking? Authorizing Provider  ferrous sulfate 325 (65 FE) MG tablet Take 325 mg by mouth as needed.   Yes Historical Provider, MD  ibuprofen (ADVIL,MOTRIN) 200 MG tablet Take 400 mg by mouth every 6 (six) hours as needed for pain.   Yes Historical Provider, MD  montelukast (SINGULAIR) 10 MG tablet Take 1 tablet (10 mg total) by mouth at bedtime. 06/18/13   Magnum Lunde P Twanda Stakes, DO     ROS: The patient denies fevers, chills, night sweats, unintentional weight loss, chest pain, palpitations, wheezing, dyspnea on exertion, nausea, vomiting, abdominal  pain, dysuria, hematuria, melena, numbness, weakness, or tingling.   All other systems have been reviewed and were otherwise negative with the exception of those mentioned in the HPI and as above.    PHYSICAL EXAM: Filed Vitals:   06/18/13 1550  BP: 120/80  Pulse: 95  Temp: 98 F (36.7 C)  Resp: 16   Filed Vitals:   06/18/13 1550  Height: 5' 2.5" (1.588 m)  Weight: 151 lb 3.2 oz (68.584 kg)   Body mass index is 27.2 kg/(m^2).  General: Alert, no acute distress HEENT:  Normocephalic, atraumatic, oropharynx patent. EOMI, PERRLA, tm nl, minimal sinus tenderness, +boggy nasal mucosa Cardiovascular:  Regular rate and rhythm, no rubs murmurs or gallops.  No Carotid bruits, radial pulse intact. No pedal edema.  Respiratory: Clear to auscultation bilaterally.  No wheezes, rales, or rhonchi.  No cyanosis, no use of accessory musculature GI: No organomegaly, abdomen is soft and non-tender, positive bowel sounds.  No masses. Skin: No rashes. Neurologic: Facial musculature symmetric. Psychiatric: Patient is appropriate throughout our interaction. Lymphatic: No cervical lymphadenopathy Musculoskeletal: Gait intact.   LABS: Results for orders placed in visit on 09/12/12  LIPID PANEL  Result Value Ref Range   Cholesterol 165  0 - 200 mg/dL   Triglycerides 65  <150 mg/dL   HDL 62  >39 mg/dL   Total CHOL/HDL Ratio 2.7     VLDL 13  0 - 40 mg/dL   LDL Cholesterol 90  0 - 99 mg/dL  FERRITIN      Result Value Ref Range   Ferritin 22  10 - 291 ng/mL  POCT CBC      Result Value Ref Range   WBC 4.1 (*) 4.6 - 10.2 K/uL   Lymph, poc 1.7  0.6 - 3.4   POC LYMPH PERCENT 41.2  10 - 50 %L   MID (cbc) 0.4  0 - 0.9   POC MID % 10.0  0 - 12 %M   POC Granulocyte 2.0  2 - 6.9   Granulocyte percent 48.8  37 - 80 %G   RBC 4.16  4.04 - 5.48 M/uL   Hemoglobin 11.5 (*) 12.2 - 16.2 g/dL   HCT, POC 38.5  37.7 - 47.9 %   MCV 92.6  80 - 97 fL   MCH, POC 27.6  27 - 31.2 pg   MCHC 29.9 (*) 31.8 -  35.4 g/dL   RDW, POC 13.2     Platelet Count, POC 241  142 - 424 K/uL   MPV 8.2  0 - 99.8 fL  PAP IG W/ RFLX HPV ASCU      Result Value Ref Range   Specimen adequacy:       FINAL DIAGNOSIS:       COMMENTS:       Cytotechnologist:       Pathologist:         EKG/XRAY:   Primary read interpreted by Dr. Marin Comment at [facility].   ASSESSMENT/PLAN: Encounter Diagnoses  Name Primary?  . Allergic rhinitis Yes  . Seasonal allergies   . Sinus congestion   . Itchy eyes    Continue with nasacort, zyrtec Rx singulair If cont to have sxs then comsider medrol dose pack F/u prn  Gross sideeffects, risk and benefits, and alternatives of medications d/w patient. Patient is aware that all medications have potential sideeffects and we are unable to predict every sideeffect or drug-drug interaction that may occur.  Glenford Bayley, DO 06/18/2013 4:58 PM

## 2013-06-22 ENCOUNTER — Telehealth: Payer: Self-pay

## 2013-06-22 DIAGNOSIS — R0981 Nasal congestion: Secondary | ICD-10-CM

## 2013-06-22 MED ORDER — METHYLPREDNISOLONE (PAK) 4 MG PO TABS: ORAL_TABLET | ORAL | Status: DC

## 2013-06-22 NOTE — Telephone Encounter (Signed)
Pt states she was seen by dr Marin Comment on Thursday 06/18/13 and is not better, wants to know what she should do?

## 2013-06-22 NOTE — Telephone Encounter (Signed)
Please call her and let her know I have sent in a medrol dose pack.

## 2013-06-22 NOTE — Telephone Encounter (Signed)
Medication has helped some but still has a lot of congestion, nasal congestion especially at night. Can we send in medrol dose pak?

## 2013-06-22 NOTE — Telephone Encounter (Signed)
Pt.notified

## 2013-07-03 ENCOUNTER — Telehealth: Payer: Self-pay

## 2013-07-03 DIAGNOSIS — R0981 Nasal congestion: Secondary | ICD-10-CM

## 2013-07-03 MED ORDER — METHYLPREDNISOLONE (PAK) 4 MG PO TABS: ORAL_TABLET | ORAL | Status: DC

## 2013-07-03 NOTE — Telephone Encounter (Signed)
Spoke to patient.  Advised her that Dr. Marin Comment refilled her medrol dose pack and inquired if patient would like to be referred to an allergist.  She stated that she would like to try the medication first and if she is no better will call us back for the referral.  I told her this would be fine.

## 2013-07-03 NOTE — Telephone Encounter (Signed)
Dr. Marin Comment:  She was in several weeks ago and has finished her medicine that you prescribed and now her allergies have come back and she needs more medicine.  Can we refill her medicine please.  [facility] [address]

## 2013-07-07 ENCOUNTER — Ambulatory Visit (INDEPENDENT_AMBULATORY_CARE_PROVIDER_SITE_OTHER): Payer: BC Managed Care – PPO | Admitting: Internal Medicine

## 2013-07-07 VITALS — BP 120/80 | HR 90 | Temp 98.4°F | Resp 16 | Ht 62.5 in | Wt 152.5 lb

## 2013-07-07 DIAGNOSIS — J302 Other seasonal allergic rhinitis: Secondary | ICD-10-CM

## 2013-07-07 DIAGNOSIS — R002 Palpitations: Secondary | ICD-10-CM

## 2013-07-07 DIAGNOSIS — R072 Precordial pain: Secondary | ICD-10-CM

## 2013-07-07 DIAGNOSIS — J309 Allergic rhinitis, unspecified: Secondary | ICD-10-CM

## 2013-07-07 LAB — POCT CBC
GRANULOCYTE PERCENT: 50.5 % (ref 37–80)
HEMATOCRIT: 40.1 % (ref 37.7–47.9)
Hemoglobin: 12.6 g/dL (ref 12.2–16.2)
LYMPH, POC: 3.2 (ref 0.6–3.4)
MCH: 28 pg (ref 27–31.2)
MCHC: 31.4 g/dL — AB (ref 31.8–35.4)
MCV: 89.2 fL (ref 80–97)
MID (cbc): 0.7 (ref 0–0.9)
MPV: 8.3 fL (ref 0–99.8)
POC Granulocyte: 3.9 (ref 2–6.9)
POC LYMPH %: 40.4 % (ref 10–50)
POC MID %: 9.1 %M (ref 0–12)
Platelet Count, POC: 302 10*3/uL (ref 142–424)
RBC: 4.5 M/uL (ref 4.04–5.48)
RDW, POC: 15.3 %
WBC: 7.8 10*3/uL (ref 4.6–10.2)

## 2013-07-07 NOTE — Progress Notes (Signed)
Subjective:     Patient ID: Paula [name], female   DOB: [birth year], 43 y.o.   [name]: [id number]  HPI 43 yr female reports chest discomfort starting this morning at 5 am states it is not painful but feels like something is coming from her stomach into her chest.  This has never happened before.  Pressure type Discomfort with some burning comes and goes.  Pt states her heart feels like it is occasionally skipping beats and then she will feel a discomfort into her neck. Does not worsen with or prevent activity. No SOB, diaphoresis.   Pt also reports allergy symptoms including rhinorhea, clogged ears, and sneezing. Taking zyrtec-d every day for the past three weeks.  Stopped 1 week ago. Also was using nasacort, stopped 3 days ago.   Stopped taking singular yesterday.  Believes these things helped.  Discussed continuing meds with patient.    Patient Active Problem List   Diagnosis Date Noted  . Amenorrhea 08/05/2012    -  AR meds- singulair intermittently   Review of Systems  Constitutional: Negative for fever, chills and fatigue.  HENT: Positive for congestion, postnasal drip, rhinorrhea, sneezing and sore throat. Negative for ear pain.   Respiratory: Negative for cough, chest tightness and shortness of breath.   Cardiovascular: Negative for chest pain and leg swelling.  Gastrointestinal: Negative for nausea, vomiting, abdominal pain and diarrhea.  Genitourinary: Negative for dysuria, hematuria and difficulty urinating.  Musculoskeletal: Positive for neck pain. Negative for arthralgias and back pain.  Skin: Negative for rash.  Allergic/Immunologic: Positive for environmental allergies.  Neurological: Negative for dizziness, light-headedness and headaches.       Objective:   Physical Exam  Constitutional: She is oriented to person, place, and time. She appears well-developed and well-nourished.  HENT:  Head: Normocephalic and atraumatic.  Eyes: Pupils are equal, round, and reactive to  light.  Neck: Normal range of motion. No thyromegaly present.  Cardiovascular: S1 normal and S2 normal.  An irregular rhythm present.  Occasional extrasystoles are present.  Pulmonary/Chest: Effort normal and breath sounds normal. No accessory muscle usage. No respiratory distress. She has no wheezes. She has no rales. She exhibits no tenderness.  Abdominal: Soft. Bowel sounds are normal. She exhibits no distension and no mass. There is no tenderness. There is no guarding.  Lymphadenopathy:    She has no cervical adenopathy.  Neurological: She is alert and oriented to person, place, and time.  Skin: Skin is warm and dry.   EKG wnl.     Filed Vitals:   07/07/13 1238  BP: 120/80  Pulse: 90  Temp: 98.4 F (36.9 C)  Resp: 16   Results for orders placed in visit on 07/07/13  POCT CBC      Result Value Ref Range   WBC 7.8  4.6 - 10.2 K/uL   Lymph, poc 3.2  0.6 - 3.4   POC LYMPH PERCENT 40.4  10 - 50 %L   MID (cbc) 0.7  0 - 0.9   POC MID % 9.1  0 - 12 %M   POC Granulocyte 3.9  2 - 6.9   Granulocyte percent 50.5  37 - 80 %G   RBC 4.50  4.04 - 5.48 M/uL   Hemoglobin 12.6  12.2 - 16.2 g/dL   HCT, POC 40.1  37.7 - 47.9 %   MCV 89.2  80 - 97 fL   MCH, POC 28.0  27 - 31.2 pg   MCHC 31.4 (*) 31.8 - 35.4 g/dL  RDW, POC 15.3     Platelet Count, POC 302  142 - 424 K/uL   MPV 8.3  0 - 99.8 fL      Assessment:    1. GERD  2. Seasonal allergic rhinitis   irreg rhythm due to variation w/ respiration w/ occas PACs    Plan:     1. zantac 300 mg at bedtime 1 month then recheck   2. Restart nasacort and zyrtec singulair  F/u 1 month  I have completed the patient encounter in its entirety as documented by [facility], with editing by me where necessary. Eduardo Honor P. Laney Pastor, M.D.

## 2013-07-09 MED ORDER — RANITIDINE HCL 300 MG PO TABS: 300.0000 mg | ORAL_TABLET | Freq: Every day | ORAL | Status: DC

## 2013-10-03 ENCOUNTER — Ambulatory Visit (INDEPENDENT_AMBULATORY_CARE_PROVIDER_SITE_OTHER): Payer: BC Managed Care – PPO | Admitting: Family Medicine

## 2013-10-03 VITALS — BP 110/80 | HR 68 | Temp 98.4°F | Resp 16 | Ht 63.0 in | Wt 150.1 lb

## 2013-10-03 DIAGNOSIS — E559 Vitamin D deficiency, unspecified: Secondary | ICD-10-CM

## 2013-10-03 DIAGNOSIS — Z0000 Encounter for general adult medical examination without abnormal findings: Secondary | ICD-10-CM

## 2013-10-03 LAB — LIPID PANEL
Cholesterol: 159 mg/dL (ref 0–200)
HDL: 56 mg/dL (ref >39)
LDL Cholesterol: 91 mg/dL (ref 0–99)
Total CHOL/HDL Ratio: 2.8 ratio
Triglycerides: 62 mg/dL (ref <150)
VLDL: 12 mg/dL (ref 0–40)

## 2013-10-03 LAB — COMPLETE METABOLIC PANEL WITH GFR
ALT: 11 U/L (ref 0–35)
AST: 18 U/L (ref 0–37)
Alkaline Phosphatase: 48 U/L (ref 39–117)
CO2: 27 mEq/L (ref 19–32)
Creat: 0.67 mg/dL (ref 0.50–1.10)
GFR, Est African American: >89 mL/min
GFR, Est Non African American: >89 mL/min
Glucose, Bld: 86 mg/dL (ref 70–99)
Sodium: 139 mEq/L (ref 135–145)
Total Bilirubin: 0.4 mg/dL (ref 0.2–1.2)
Total Protein: 6.8 g/dL (ref 6.0–8.3)

## 2013-10-03 LAB — COMPLETE METABOLIC PANEL WITHOUT GFR
Albumin: 3.9 g/dL (ref 3.5–5.2)
BUN: 13 mg/dL (ref 6–23)
Calcium: 9.1 mg/dL (ref 8.4–10.5)
Chloride: 105 meq/L (ref 96–112)
Potassium: 4.3 meq/L (ref 3.5–5.3)

## 2013-10-03 LAB — POCT CBC
Granulocyte percent: 9.3 %G — AB (ref 37–80)
HCT, POC: 39.3 % (ref 37.7–47.9)
Hemoglobin: 12.5 g/dL (ref 12.2–16.2)
Lymph, poc: 1.8 (ref 0.6–3.4)
MCH, POC: 27.4 pg (ref 27–31.2)
MCHC: 31.8 g/dL (ref 31.8–35.4)
MCV: 86.2 fL (ref 80–97)
MID (cbc): 0.4 (ref 0–0.9)
MPV: 7.6 fL (ref 0–99.8)
POC Granulocyte: 2 (ref 2–6.9)
POC LYMPH PERCENT: 42.9 %L (ref 10–50)
POC MID %: 9.3 %M (ref 0–12)
Platelet Count, POC: 239 10*3/uL (ref 142–424)
RBC: 4.55 M/uL (ref 4.04–5.48)
RDW, POC: 14.8 %
WBC: 4.2 10*3/uL — AB (ref 4.6–10.2)

## 2013-10-03 LAB — POCT URINALYSIS DIPSTICK
Bilirubin, UA: NEGATIVE
Blood, UA: NEGATIVE
Glucose, UA: NEGATIVE
Ketones, UA: NEGATIVE
Leukocytes, UA: NEGATIVE
Nitrite, UA: NEGATIVE
Protein, UA: NEGATIVE
Spec Grav, UA: 1.025
Urobilinogen, UA: 0.2
pH, UA: 5

## 2013-10-03 LAB — TSH: TSH: 1.084 u[IU]/mL (ref 0.350–4.500)

## 2013-10-03 NOTE — Progress Notes (Signed)
Chief Complaint:  Chief Complaint  Patient presents with  . Annual Exam    HPI: Paula [name] is a 43 y.o. female who is here for CPE She has had a mammogram in 05/2013 which shows benign cysts Last pap was 09/2012-it was normal LMP was 09/22/2013 She has HAs if she does not get sleep, she does not take anything for it. She has no neuro sxs with HA, has not tried anything for it.  She has itchy ears She has not eaten  She has never had an abnormal pap smear.  She is still taking iron daily 1x per week- 325 mg  She has 3 school age children. She is not married but is in a long term relationship with a female partner  Her last tetanus shot was 2013. She works in housekeeping     Past Medical History  Diagnosis Date  . Anemia   . Allergy    Past Surgical History  Procedure Laterality Date  . No past surgeries     History   Social History  . Marital Status: Single    Spouse Name: N/A    Number of Children: 3  . Years of Education: 12   Occupational History  . [employer]    Social History Main Topics  . Smoking status: Never Smoker   . Smokeless tobacco: Never Used  . Alcohol Use: No  . Drug Use: No  . Sexual Activity: Not Currently   Other Topics Concern  . None   Social History Narrative  . None   Family History  Problem Relation Age of Onset  . Hypertension Mother   . Hypertension Sister    No Known Allergies Prior to Admission medications   Medication Sig Start Date End Date Taking? Authorizing Provider  ferrous sulfate 325 (65 FE) MG tablet Take 325 mg by mouth as needed.   Yes Historical Provider, MD  ibuprofen (ADVIL,MOTRIN) 200 MG tablet Take 400 mg by mouth every 6 (six) hours as needed for pain.   Yes Historical Provider, MD     ROS: The patient denies fevers, chills, night sweats, unintentional weight loss, chest pain, palpitations, wheezing, dyspnea on exertion, nausea, vomiting, abdominal pain, dysuria, hematuria, melena, numbness, weakness,  or tingling.   All other systems have been reviewed and were otherwise negative with the exception of those mentioned in the HPI and as above.    PHYSICAL EXAM: Filed Vitals:   10/03/13 1021  BP: 110/80  Pulse: 68  Temp: 98.4 F (36.9 C)  Resp: 16   Filed Vitals:   10/03/13 1021  Height: 5\' 3"  (1.6 m)  Weight: 150 lb 2 oz (68.096 kg)   Body mass index is 26.6 kg/(m^2).  General: Alert, no acute distress HEENT:  Normocephalic, atraumatic, oropharynx patent. EOMI, PERRLA Cardiovascular:  Regular rate and rhythm, no rubs murmurs or gallops.  No Carotid bruits, radial pulse intact. No pedal edema.  Respiratory: Clear to auscultation bilaterally.  No wheezes, rales, or rhonchi.  No cyanosis, no use of accessory musculature GI: No organomegaly, abdomen is soft and non-tender, positive bowel sounds.  No masses. Skin: No rashes. Neurologic: Facial musculature symmetric. Psychiatric: Patient is appropriate throughout our interaction. Lymphatic: No cervical lymphadenopathy Musculoskeletal: Gait intact. Breast exam normal GU-normal    LABS: Results for orders placed in visit on 10/03/13  COMPLETE METABOLIC PANEL WITH GFR      Result Value Ref Range   Sodium 139  135 - 145 mEq/L  Potassium 4.3  3.5 - 5.3 mEq/L   Chloride 105  96 - 112 mEq/L   CO2 27  19 - 32 mEq/L   Glucose, Bld 86  70 - 99 mg/dL   BUN 13  6 - 23 mg/dL   Creat 0.67  0.50 - 1.10 mg/dL   Total Bilirubin 0.4  0.2 - 1.2 mg/dL   Alkaline Phosphatase 48  39 - 117 U/L   AST 18  0 - 37 U/L   ALT 11  0 - 35 U/L   Total Protein 6.8  6.0 - 8.3 g/dL   Albumin 3.9  3.5 - 5.2 g/dL   Calcium 9.1  8.4 - 10.5 mg/dL   GFR, Est African American >89     GFR, Est Non African American >89    TSH      Result Value Ref Range   TSH 1.084  0.350 - 4.500 uIU/mL  LIPID PANEL      Result Value Ref Range   Cholesterol 159  0 - 200 mg/dL   Triglycerides 62  <150 mg/dL   HDL 56  >39 mg/dL   Total CHOL/HDL Ratio 2.8     VLDL 12   0 - 40 mg/dL   LDL Cholesterol 91  0 - 99 mg/dL  VITAMIN D 25 HYDROXY      Result Value Ref Range   Vit D, 25-Hydroxy 39  30 - 89 ng/mL  POCT CBC      Result Value Ref Range   WBC 4.2 (*) 4.6 - 10.2 K/uL   Lymph, poc 1.8  0.6 - 3.4   POC LYMPH PERCENT 42.9  10 - 50 %L   MID (cbc) 0.4  0 - 0.9   POC MID % 9.3  0 - 12 %M   POC Granulocyte 2.0  2 - 6.9   Granulocyte percent 9.3 (*) 37 - 80 %G   RBC 4.55  4.04 - 5.48 M/uL   Hemoglobin 12.5  12.2 - 16.2 g/dL   HCT, POC 39.3  37.7 - 47.9 %   MCV 86.2  80 - 97 fL   MCH, POC 27.4  27 - 31.2 pg   MCHC 31.8  31.8 - 35.4 g/dL   RDW, POC 14.8     Platelet Count, POC 239  142 - 424 K/uL   MPV 7.6  0 - 99.8 fL  POCT URINALYSIS DIPSTICK      Result Value Ref Range   Color, UA yellow     Clarity, UA clear     Glucose, UA neg     Bilirubin, UA neg     Ketones, UA neg     Spec Grav, UA 1.025     Blood, UA neg     pH, UA 5.0     Protein, UA neg     Urobilinogen, UA 0.2     Nitrite, UA neg     Leukocytes, UA Negative       EKG/XRAY:   Primary read interpreted by Dr. Marin Comment at [facility].   ASSESSMENT/PLAN: Encounter Diagnoses  Name Primary?  . Annual physical exam Yes  . Unspecified vitamin D deficiency    Annual labs UTD on mammogram F/u prn otherwise in 1 year   Gross sideeffects, risk and benefits, and alternatives of medications d/w patient. Patient is aware that all medications have potential sideeffects and we are unable to predict every sideeffect or drug-drug interaction that may occur.  Brok Stocking, [place], DO 10/05/2013 5:43 PM

## 2013-10-05 LAB — VITAMIN D 25 HYDROXY (VIT D DEFICIENCY, FRACTURES): Vit D, 25-Hydroxy: 39 ng/mL (ref 30–89)

## 2013-10-06 ENCOUNTER — Encounter: Payer: Self-pay | Admitting: Family Medicine

## 2014-01-23 ENCOUNTER — Ambulatory Visit (INDEPENDENT_AMBULATORY_CARE_PROVIDER_SITE_OTHER): Payer: BC Managed Care – PPO | Admitting: Family Medicine

## 2014-01-23 VITALS — BP 136/78 | HR 85 | Temp 99.2°F | Resp 16 | Ht 62.25 in | Wt 145.5 lb

## 2014-01-23 DIAGNOSIS — R519 Headache, unspecified: Secondary | ICD-10-CM

## 2014-01-23 DIAGNOSIS — R51 Headache: Secondary | ICD-10-CM

## 2014-01-23 MED ORDER — BUTALBITAL-APAP-CAFFEINE 50-325-40 MG PO TABS: 1.0000 | ORAL_TABLET | Freq: Four times a day (QID) | ORAL | Status: AC | PRN

## 2014-01-23 NOTE — Patient Instructions (Signed)
Someone will contact you in the next few days and let you know when you're CT scan is scheduled for.  Take the headache pain pills one or 2 pills when needed for severe headache. Do not exceed 6 pills in 24 hours.  Take ibuprofen still for minor headaches  If not doing better over the next 2 weeks please return. I will try to let you know when I get the CT report, but if you do not hear from [place] within 3 days of getting the CT scan please call.

## 2014-01-23 NOTE — Progress Notes (Signed)
Subjective: 43 year old lady, African-American from [place], who has been having headaches over the last couple of months. It hurts in the right temporoparietal area. She has some numbness sensation around into her jaw. She has had wisdom teeth extracted over the last couple of years on both sides. She has not noticed any neurologic changes otherwise. The headaches get some relief with taking ibuprofen. She has almost daily headache, fairly refractory.  Objective: Alert and oriented. TMs normal. Eyes PERRLA. Fundi benign. Throat clear. Neck supple without nodes. Chest clear. Heart regular without murmurs. No carotid bruits. Motor is symmetrical. Finger to nose normal. Tandem walk normal. Romberg negative.cranial nerves II-12 grossly intact  Assessment Headache, fairly intractable, new onset type over the last 2 months  Plan: CT of head Fioricet Return if not improving:

## 2014-02-08 ENCOUNTER — Ambulatory Visit
Admission: RE | Admit: 2014-02-08 | Discharge: 2014-02-08 | Disposition: A | Discharge status: Home / Self Care | Payer: BC Managed Care – PPO | Source: Ambulatory Visit | Attending: Family Medicine | Admitting: Family Medicine

## 2014-02-08 DIAGNOSIS — R519 Headache, unspecified: Secondary | ICD-10-CM

## 2014-02-08 DIAGNOSIS — R51 Headache: Principal | ICD-10-CM

## 2014-07-24 ENCOUNTER — Ambulatory Visit (INDEPENDENT_AMBULATORY_CARE_PROVIDER_SITE_OTHER): Payer: BC Managed Care – PPO | Admitting: Physician Assistant

## 2014-07-24 VITALS — BP 112/70 | HR 67 | Temp 98.8°F | Ht 62.25 in | Wt 148.2 lb

## 2014-07-24 DIAGNOSIS — J309 Allergic rhinitis, unspecified: Secondary | ICD-10-CM

## 2014-07-24 MED ORDER — CETIRIZINE HCL 10 MG PO TABS
10.0000 mg | ORAL_TABLET | Freq: Every day | ORAL | Status: DC
Start: 2014-07-24 — End: 2015-06-22

## 2014-07-24 MED ORDER — FLUTICASONE PROPIONATE 50 MCG/ACT NA SUSP: 2.0000 | Freq: Every day | NASAL | Status: DC

## 2014-07-24 NOTE — Progress Notes (Signed)
   Subjective:    Patient ID: Paula [name], female    DOB: May 04, 1970, 44 y.o.   [name]: [id number]  HPI Patient presents for referral to allergist for seasonal allergies. Has problems years, but is not currently taking any medication. Symptoms congestion, sinus/ear pressure, HA, rhinorrhea, and sneezing developed about 1 month ago. Denies fever, fatigue, chills, N/V, or cough. Has taken ibuprofen for allergies without any relief. Took nasonex on 2 days without relief. No h/o asthma or smoking. NKDA.    Review of Systems  Constitutional: Negative for fever, chills, activity change, appetite change and fatigue.  HENT: Positive for congestion, ear pain, rhinorrhea, sinus pressure and sneezing. Negative for ear discharge, postnasal drip and sore throat.   Eyes: Positive for itching. Negative for pain, discharge and redness.  Respiratory: Negative.  Negative for cough, shortness of breath and wheezing.   Cardiovascular: Negative for chest pain.  Gastrointestinal: Negative for nausea and vomiting.  Neurological: Positive for headaches. Negative for dizziness.  Hematological: Positive for adenopathy.       Objective:   Physical Exam  Constitutional: She is oriented to person, place, and time. She appears well-developed and well-nourished. No distress.  Blood pressure 112/70, pulse 67, temperature 98.8 F (37.1 C), temperature source Oral, height 5' 2.25" (1.581 m), weight 148 lb 3.2 oz (67.223 kg), last menstrual period 07/05/2014, SpO2 99 %.  HENT:  Head: Normocephalic and atraumatic.  Right Ear: External ear and ear canal normal. A middle ear effusion (seous) is present.  Left Ear: Tympanic membrane, external ear and ear canal normal.  Nose: Rhinorrhea (with minimal erythema) present. No mucosal edema. Right sinus exhibits no maxillary sinus tenderness and no frontal sinus tenderness. Left sinus exhibits no maxillary sinus tenderness and no frontal sinus tenderness.  Mouth/Throat: Uvula is  midline, oropharynx is clear and moist and mucous membranes are normal. No oropharyngeal exudate, posterior oropharyngeal edema, posterior oropharyngeal erythema or tonsillar abscesses.  Eyes: Conjunctivae are normal. Pupils are equal, round, and reactive to light. Right eye exhibits no discharge. Left eye exhibits no discharge. No scleral icterus.  Neck: Normal range of motion. Neck supple. No thyromegaly present.  Cardiovascular: Normal rate, regular rhythm and normal heart sounds.  Exam reveals no gallop and no friction rub.   No murmur heard. Pulmonary/Chest: Effort normal and breath sounds normal. No respiratory distress. She has no wheezes. She has no rales. She exhibits no tenderness.  Abdominal: Soft. Bowel sounds are normal. She exhibits no distension. There is no tenderness. There is no rebound and no guarding.  Lymphadenopathy:    She has cervical adenopathy.  Neurological: She is alert and oriented to person, place, and time.  Skin: Skin is warm and dry. No rash noted. She is not diaphoretic. No erythema.       Assessment & Plan:  1. Allergic rhinitis, unspecified allergic rhinitis type Counseled patient that medication should be taken daily in order to work.  - fluticasone (FLONASE) 50 MCG/ACT nasal spray; Place 2 sprays into both nostrils daily.  Dispense: 16 g; Refill: 12 - cetirizine (ZYRTEC) 10 MG tablet; Take 1 tablet (10 mg total) by mouth daily.  Dispense: 30 tablet; Refill: 11 - Ambulatory referral to [place] PA-C  [facility] 07/24/2014 1:38 PM

## 2014-07-24 NOTE — Patient Instructions (Signed)

## 2015-04-01 ENCOUNTER — Encounter: Payer: Self-pay | Admitting: Family Medicine

## 2015-04-06 ENCOUNTER — Encounter: Payer: Self-pay | Admitting: Family Medicine

## 2015-06-22 ENCOUNTER — Ambulatory Visit (INDEPENDENT_AMBULATORY_CARE_PROVIDER_SITE_OTHER): Payer: BC Managed Care – PPO

## 2015-06-22 ENCOUNTER — Ambulatory Visit (INDEPENDENT_AMBULATORY_CARE_PROVIDER_SITE_OTHER): Payer: BC Managed Care – PPO | Admitting: Family Medicine

## 2015-06-22 VITALS — BP 105/68 | HR 98 | Temp 99.1°F | Resp 17 | Ht 62.5 in | Wt 145.0 lb

## 2015-06-22 DIAGNOSIS — R5383 Other fatigue: Secondary | ICD-10-CM

## 2015-06-22 DIAGNOSIS — D509 Iron deficiency anemia, unspecified: Secondary | ICD-10-CM

## 2015-06-22 DIAGNOSIS — R042 Hemoptysis: Secondary | ICD-10-CM | POA: Diagnosis not present

## 2015-06-22 DIAGNOSIS — G478 Other sleep disorders: Secondary | ICD-10-CM

## 2015-06-22 DIAGNOSIS — R599 Enlarged lymph nodes, unspecified: Secondary | ICD-10-CM

## 2015-06-22 DIAGNOSIS — R590 Localized enlarged lymph nodes: Secondary | ICD-10-CM

## 2015-06-22 DIAGNOSIS — J302 Other seasonal allergic rhinitis: Secondary | ICD-10-CM

## 2015-06-22 LAB — POCT CBC
GRANULOCYTE PERCENT: 51.6 % (ref 37–80)
HCT, POC: 31.3 % — AB (ref 37.7–47.9)
HEMOGLOBIN: 10.4 g/dL — AB (ref 12.2–16.2)
Lymph, poc: 1.7 (ref 0.6–3.4)
MCH, POC: 23.4 pg — AB (ref 27–31.2)
MCHC: 33.3 g/dL (ref 31.8–35.4)
MCV: 70.3 fL — AB (ref 80–97)
MID (CBC): 0.5 (ref 0–0.9)
MPV: 7.3 fL (ref 0–99.8)
POC Granulocyte: 2.4 (ref 2–6.9)
POC LYMPH PERCENT: 37.3 %L (ref 10–50)
POC MID %: 11.1 %M (ref 0–12)
Platelet Count, POC: 280 10*3/uL (ref 142–424)
RBC: 4.45 M/uL (ref 4.04–5.48)
RDW, POC: 19.4 %
WBC: 4.6 10*3/uL (ref 4.6–10.2)

## 2015-06-22 LAB — COMPREHENSIVE METABOLIC PANEL
ALBUMIN: 3.6 g/dL (ref 3.6–5.1)
ALT: 10 U/L (ref 6–29)
AST: 17 U/L (ref 10–30)
Alkaline Phosphatase: 45 U/L (ref 33–115)
BUN: 15 mg/dL (ref 7–25)
CHLORIDE: 107 mmol/L (ref 98–110)
CO2: 27 mmol/L (ref 20–31)
Calcium: 8.9 mg/dL (ref 8.6–10.2)
Creat: 0.57 mg/dL (ref 0.50–1.10)
Glucose, Bld: 70 mg/dL (ref 65–99)
POTASSIUM: 4.2 mmol/L (ref 3.5–5.3)
Sodium: 140 mmol/L (ref 135–146)
TOTAL PROTEIN: 6.5 g/dL (ref 6.1–8.1)
Total Bilirubin: 0.3 mg/dL (ref 0.2–1.2)

## 2015-06-22 LAB — POCT SEDIMENTATION RATE: POCT SED RATE: 60 mm/hr — AB (ref 0–22)

## 2015-06-22 MED ORDER — MUCINEX DM MAXIMUM STRENGTH 60-1200 MG PO TB12: 1.0000 | ORAL_TABLET | Freq: Two times a day (BID) | ORAL | Status: DC

## 2015-06-22 MED ORDER — FERROUS SULFATE 324 (65 FE) MG PO TBEC: 1.0000 | DELAYED_RELEASE_TABLET | Freq: Every day | ORAL | Status: DC

## 2015-06-22 NOTE — Patient Instructions (Addendum)
IF you received an x-ray today, you will receive an invoice from [facility]. Please contact [facility] at [phone] with questions or concerns regarding your invoice.   IF you received labwork today, you will receive an invoice from [employer]. Please contact [facility] at [phone] with questions or concerns regarding your invoice.   Our billing staff will not be able to assist you with questions regarding bills from these companies.  You will be contacted with the lab results as soon as they are available. The fastest way to get your results is to activate your My Chart account. Instructions are located on the last page of this paperwork. If you have not heard from [place] regarding the results in 2 weeks, please contact this office.    Lymphadenopathy Lymphadenopathy refers to swollen or enlarged lymph glands, also called lymph nodes. Lymph glands are part of your body's defense (immune) system, which protects the body from infections, germs, and diseases. Lymph glands are found in many locations in your body, including the neck, underarm, and groin.  Many things can cause lymph glands to become enlarged. When your immune system responds to germs, such as viruses or bacteria, infection-fighting cells and fluid build up. This causes the glands to grow in size. Usually, this is not something to worry about. The swelling and any soreness often go away without treatment. However, swollen lymph glands can also be caused by a number of diseases. Your health care provider may do various tests to help determine the cause. If the cause of your swollen lymph glands cannot be found, it is important to monitor your condition to make sure the swelling goes away. HOME CARE INSTRUCTIONS Watch your condition for any changes. The following actions may help to lessen any discomfort you are feeling:  Get plenty of rest.  Take medicines only as directed by your  health care provider. Your health care provider may recommend over-the-counter medicines for pain.  Apply moist heat compresses to the site of swollen lymph nodes as directed by your health care provider. This can help reduce any pain.  Check your lymph nodes daily for any changes.  Keep all follow-up visits as directed by your health care provider. This is important. SEEK MEDICAL CARE IF:  Your lymph nodes are still swollen after 2 weeks.  Your swelling increases or spreads to other areas.  Your lymph nodes are hard, seem fixed to the skin, or are growing rapidly.  Your skin over the lymph nodes is red and inflamed.  You have a fever.  You have chills.  You have fatigue.  You develop a sore throat.  You have abdominal pain.  You have weight loss.  You have night sweats. SEEK IMMEDIATE MEDICAL CARE IF:  You notice fluid leaking from the area of the enlarged lymph node.  You have severe pain in any area of your body.  You have chest pain.  You have shortness of breath.   This information is not intended to replace advice given to you by your health care provider. Make sure you discuss any questions you have with your health care provider.   Document Released: 12/06/2007 Document Revised: 03/19/2014 Document Reviewed: 10/01/2013 Elsevier Interactive Patient Education 2016 [employer].  Iron-Rich Diet Iron is a mineral that helps your body to produce hemoglobin. Hemoglobin is a protein in your red blood cells that carries oxygen to your body's tissues. Eating too little iron may cause you to feel weak and  tired, and it can increase your risk for infection. Eating enough iron is necessary for your body's metabolism, muscle function, and nervous system. Iron is naturally found in many foods. It can also be added to foods or fortified in foods. There are two types of dietary iron:  Heme iron. Heme iron is absorbed by the body more easily than nonheme iron. Heme iron is  found in meat, poultry, and fish.  Nonheme iron. Nonheme iron is found in dietary supplements, iron-fortified grains, beans, and vegetables. You may need to follow an iron-rich diet if:  You have been diagnosed with iron deficiency or iron-deficiency anemia.  You have a condition that prevents you from absorbing dietary iron, such as:  Infection in your intestines.  Celiac disease. This involves long-lasting (chronic) inflammation of your intestines.  You do not eat enough iron.  You eat a diet that is high in foods that impair iron absorption.  You have lost a lot of blood.  You have heavy bleeding during your menstrual cycle.  You are pregnant. WHAT IS MY PLAN? Your health care provider may help you to determine how much iron you need per day based on your condition. Generally, when a person consumes sufficient amounts of iron in the diet, the following iron needs are met:  Men.  80-55 years old: 11 mg per day.  49-73 years old: 8 mg per day.  Women.   80-31 years old: 15 mg per day.  63-100 years old: 18 mg per day.  Over 47 years old: 8 mg per day.  Pregnant women: 27 mg per day.  Breastfeeding women: 9 mg per day. WHAT DO I NEED TO KNOW ABOUT AN IRON-RICH DIET?  Eat fresh fruits and vegetables that are high in vitamin C along with foods that are high in iron. This will help increase the amount of iron that your body absorbs from food, especially with foods containing nonheme iron. Foods that are high in vitamin C include oranges, peppers, tomatoes, and mango.  Take iron supplements only as directed by your health care provider. Overdose of iron can be life-threatening. If you were prescribed iron supplements, take them with orange juice or a vitamin C supplement.  Cook foods in pots and pans that are made from iron.   Eat nonheme iron-containing foods alongside foods that are high in heme iron. This helps to improve your iron absorption.   Certain foods and  drinks contain compounds that impair iron absorption. Avoid eating these foods in the same meal as iron-rich foods or with iron supplements. These include:  Coffee, black tea, and red wine.  Milk, dairy products, and foods that are high in calcium.  Beans, soybeans, and peas.  Whole grains.  When eating foods that contain both nonheme iron and compounds that impair iron absorption, follow these tips to absorb iron better.   Soak beans overnight before cooking.  Soak whole grains overnight and drain them before using.  Ferment flours before baking, such as using yeast in bread dough. WHAT FOODS CAN I EAT? Grains Iron-fortified breakfast cereal. Iron-fortified whole-wheat bread. Enriched rice. Sprouted grains. Vegetables Spinach. Potatoes with skin. Green peas. Broccoli. Red and green bell peppers. Fermented vegetables. Fruits Prunes. Raisins. Oranges. Strawberries. Mango. Grapefruit. Meats and Other Protein Sources Beef liver. Oysters. Beef. Shrimp. [place]. Chicken. [place]. Sardines. Chickpeas. Nuts. Tofu. Beverages Tomato juice. Fresh orange juice. Prune juice. Hibiscus tea. Fortified instant breakfast shakes. Condiments Tahini. Fermented soy sauce. Sweets and Desserts Black-strap molasses.  Other  Wheat germ. The items listed above may not be a complete list of recommended foods or beverages. Contact your dietitian for more options. WHAT FOODS ARE NOT RECOMMENDED? Grains Whole grains. Bran cereal. Bran flour. Oats. Vegetables Artichokes. Brussels sprouts. Kale. Fruits Blueberries. Raspberries. Strawberries. Figs. Meats and Other Protein Sources Soybeans. Products made from soy protein. Dairy Milk. Cream. Cheese. Yogurt. Cottage cheese. Beverages Coffee. Black tea. Red wine. Sweets and Desserts Cocoa. Chocolate. Ice cream. Other Basil. Oregano. Parsley. The items listed above may not be a complete list of foods and beverages to avoid. Contact your  dietitian for more information.   This information is not intended to replace advice given to you by your health care provider. Make sure you discuss any questions you have with your health care provider.   Document Released: 10/10/2004 Document Revised: 03/19/2014 Document Reviewed: 09/23/2013 Elsevier Interactive Patient Education 2016 [employer].  Iron Deficiency Anemia, Adult Anemia is a condition in which there are less red blood cells or hemoglobin in the blood than normal. Hemoglobin is the part of red blood cells that carries oxygen. Iron deficiency anemia is anemia caused by too little iron. It is the most common type of anemia. It may leave you tired and short of breath. CAUSES   Lack of iron in the diet.  Poor absorption of iron, as seen with intestinal disorders.  Intestinal bleeding.  Heavy periods. SIGNS AND SYMPTOMS  Mild anemia may not be noticeable. Symptoms may include:  Fatigue.  Headache.  Pale skin.  Weakness.  Tiredness.  Shortness of breath.  Dizziness.  Cold hands and feet.  Fast or irregular heartbeat. DIAGNOSIS  Diagnosis requires a thorough evaluation and physical exam by your health care provider. Blood tests are generally used to confirm iron deficiency anemia. Additional tests may be done to find the underlying cause of your anemia. These may include:  Testing for blood in the stool (fecal occult blood test).  A procedure to see inside the colon and rectum (colonoscopy).  A procedure to see inside the esophagus and stomach (endoscopy). TREATMENT  Iron deficiency anemia is treated by correcting the cause of the deficiency. Treatment may involve:  Adding iron-rich foods to your diet.  Taking iron supplements. Pregnant or breastfeeding women need to take extra iron because their normal diet usually does not provide the required amount.  Taking vitamins. Vitamin C improves the absorption of iron. Your health care provider may  recommend that you take your iron tablets with a glass of orange juice or vitamin C supplement.  Medicines to make heavy menstrual flow lighter.  Surgery. HOME CARE INSTRUCTIONS   Take iron as directed by your health care provider.  If you cannot tolerate taking iron supplements by mouth, talk to your health care provider about taking them through a vein (intravenously) or an injection into a muscle.  For the best iron absorption, iron supplements should be taken on an empty stomach. If you cannot tolerate them on an empty stomach, you may need to take them with food.  Do not drink milk or take antacids at the same time as your iron supplements. Milk and antacids may interfere with the absorption of iron.  Iron supplements can cause constipation. Make sure to include fiber in your diet to prevent constipation. A stool softener may also be recommended.  Take vitamins as directed by your health care provider.  Eat a diet rich in iron. Foods high in iron include liver, lean beef, whole-grain bread, eggs, dried fruit,  and dark green leafy vegetables. SEEK IMMEDIATE MEDICAL CARE IF:   You faint. If this happens, do not drive. Call your local emergency services (911 in [place] if no other help is available.  You have chest pain.  You feel nauseous or vomit.  You have severe or increased shortness of breath with activity.  You feel weak.  You have a rapid heartbeat.  You have unexplained sweating.  You become light-headed when getting up from a chair or bed. MAKE SURE YOU:   Understand these instructions.  Will watch your condition.  Will get help right away if you are not doing well or get worse.   This information is not intended to replace advice given to you by your health care provider. Make sure you discuss any questions you have with your health care provider.   Document Released: 02/24/2000 Document Revised: 03/19/2014 Document Reviewed: 11/03/2012 Elsevier Interactive  Patient Education [employer].

## 2015-06-22 NOTE — Progress Notes (Signed)
By signing my name below, I, Mesha Guiyard, attest that this documentation has been prepared under the direction and in the presence of Delman Cheadle, MD.  Electronically Signed: Verlee Monte, Medical Scribe. 06/22/2015. 10:57 AM.  Subjective:    Patient ID: Paula [name], female    DOB: Feb 07, 1971, 45 y.o.   [name]: [id number]  HPI Chief Complaint  Patient presents with  . Cough  . URI  . neck swelling    HPI Comments: Paula [name] is a 45 y.o. female who presents to the [facility] complaining of jaw pain and swelling in neck that began 2 weeks ago. Pt denies dental pain. She states she had wisdom teeth extracted three years ago. She says she's been to the dentist in the last 6 months, and is due soon. She mentions bright red blood in sputum when coughing. Pt denies bloody stool, melena, sore throat, emesis, trouble swallowing, weight changes, and facial swelling.  Pt reports difficulty sleeping that began a year ago. She reports waking up during the night, but denies difficulty falling asleep. She reports feeling tired when she wakes up. Pt denies taking medication for her trouble sleeping. Pt mentions that she snores while sleeping.  Pt takes singular and xyzal. Pt reports taking Flonase for allergies.   Patient Active Problem List   Diagnosis Date Noted  . Amenorrhea 08/05/2012   Past Medical History  Diagnosis Date  . Anemia   . Allergy    Past Surgical History  Procedure Laterality Date  . No past surgeries     No Known Allergies Prior to Admission medications   Medication Sig Start Date End Date Taking? Authorizing Provider  levocetirizine (XYZAL) 5 MG tablet Take 5 mg by mouth every evening.   Yes Historical Provider, MD  montelukast (SINGULAIR) 10 MG tablet Take 10 mg by mouth at bedtime.   Yes Historical Provider, MD  Multiple Vitamins-Minerals (MULTIVITAMIN WITH MINERALS) tablet Take 1 tablet by mouth daily.   Yes Historical Provider, MD   Social  History   Social History  . Marital Status: Single    Spouse Name: N/A  . Number of Children: 3  . Years of Education: 12   Occupational History  . [employer]    Social History Main Topics  . Smoking status: Never Smoker   . Smokeless tobacco: Never Used  . Alcohol Use: No  . Drug Use: No  . Sexual Activity: Not Currently   Other Topics Concern  . Not on file   Social History Narrative   Review of Systems  Constitutional: Negative for unexpected weight change.  HENT: Negative for dental problem, facial swelling, sore throat and trouble swallowing.   Respiratory: Positive for cough.   Gastrointestinal: Negative for vomiting, diarrhea, constipation and blood in stool.  Neurological: Negative for facial asymmetry.  Psychiatric/Behavioral: Positive for sleep disturbance.   Objective:   Physical Exam  Constitutional: She appears well-developed and well-nourished. No distress.  HENT:  Head: Normocephalic and atraumatic.  Right Ear: Tympanic membrane normal.  Left Ear: Tympanic membrane normal.  Nares are boggy and edematous.   Eyes: Conjunctivae are normal.  Neck: Neck supple. No thyromegaly present.  Cardiovascular: Normal rate, regular rhythm and normal heart sounds.   No murmur heard. Pulmonary/Chest: Effort normal and breath sounds normal.  Lungs clear  Lymphadenopathy:       Head (right side): Submandibular adenopathy present.       Right cervical: No posterior cervical adenopathy present.  Left cervical: No posterior cervical adenopathy present.       Right: No supraclavicular adenopathy present.       Left: No supraclavicular adenopathy present.  Neurological: She is alert.  Skin: Skin is warm and dry.  Psychiatric: She has a normal mood and affect. Her behavior is normal.  Nursing note and vitals reviewed.  Filed Vitals:   06/22/15 1053  BP: 105/68  Pulse: 98  Temp: 99.1 F (37.3 C)  TempSrc: Oral  Resp: 17  Height: 5' 2.5" (1.588 m)  Weight: 145 lb  (65.772 kg)  SpO2: 99%   Results for orders placed or performed in visit on 06/22/15  POCT CBC  Result Value Ref Range   WBC 4.6 4.6 - 10.2 K/uL   Lymph, poc 1.7 0.6 - 3.4   POC LYMPH PERCENT 37.3 10 - 50 %L   MID (cbc) 0.5 0 - 0.9   POC MID % 11.1 0 - 12 %M   POC Granulocyte 2.4 2 - 6.9   Granulocyte percent 51.6 37 - 80 %G   RBC 4.45 4.04 - 5.48 M/uL   Hemoglobin 10.4 (A) 12.2 - 16.2 g/dL   HCT, POC 31.3 (A) 37.7 - 47.9 %   MCV 70.3 (A) 80 - 97 fL   MCH, POC 23.4 (A) 27 - 31.2 pg   MCHC 33.3 31.8 - 35.4 g/dL   RDW, POC 19.4 %   Platelet Count, POC 280 142 - 424 K/uL   MPV 7.3 0 - 99.8 fL   Dg Chest 2 View  06/22/2015  CLINICAL DATA:  Bright red blood in sputum when coughing, patient reports chest pressure and tightness. EXAM: CHEST  2 VIEW COMPARISON:  PA and lateral chest x-ray of July 09, 2012 FINDINGS: The heart size and mediastinal contours are within normal limits. Both lungs are clear. The visualized skeletal structures are unremarkable. IMPRESSION: No active cardiopulmonary disease. Electronically Signed   By: David  Martinique M.D.   On: 06/22/2015 11:51     Assessment & Plan:   1. Lymphadenopathy of head and neck region -  Suspect viral URI but will check [place] since has been present for sev wks already w/o improvement  2. Other fatigue   3. Poor sleep pattern   4. Seasonal allergic rhinitis   5. Hemoptysis - suspect coming from upper airway, cbc and cxr reassuring, rtc for further eval if continues  6. Anemia, iron deficiency - restart iron supp for sev wks.    Orders Placed This Encounter  Procedures  . DG Chest 2 View    Standing Status: Future     Number of Occurrences: 1     Standing Expiration Date: 06/21/2016    Order Specific Question:  Reason for Exam (SYMPTOM  OR DIAGNOSIS REQUIRED)    Answer:  hemoptysis    Order Specific Question:  Is the patient pregnant?    Answer:  No    Order Specific Question:  Preferred imaging location?    Answer:  External  .  US Soft Tissue Head/Neck    Wt 145/no needs/bcbs/plm and pt with epic order    Standing Status: Future     Number of Occurrences: 1     Standing Expiration Date: 08/21/2016    Order Specific Question:  Reason for Exam (SYMPTOM  OR DIAGNOSIS REQUIRED)    Answer:  swelling, trouble swallowing for several weeks.    Order Specific Question:  Preferred imaging location?    Answer:  [facility]  .  Thyroid Panel With TSH  . Pathologist smear review  . Comprehensive metabolic panel  . Ferritin  . POCT CBC  . POCT SEDIMENTATION RATE    Meds ordered this encounter  Medications  . montelukast (SINGULAIR) 10 MG tablet    Sig: Take 10 mg by mouth at bedtime.  Marland Kitchen levocetirizine (XYZAL) 5 MG tablet    Sig: Take 5 mg by mouth every evening.  . Multiple Vitamins-Minerals (MULTIVITAMIN WITH MINERALS) tablet    Sig: Take 1 tablet by mouth daily.  Marland Kitchen Dextromethorphan-Guaifenesin (MUCINEX DM MAXIMUM STRENGTH) 60-1200 MG TB12    Sig: Take 1 tablet by mouth every 12 (twelve) hours.    Dispense:  14 each    Refill:  1  . ferrous sulfate 324 (65 Fe) MG TBEC    Sig: Take 1 tablet (325 mg total) by mouth daily.    Dispense:  30 tablet    Refill:  2    I personally performed the services described in this documentation, which was scribed in my presence. The recorded information has been reviewed and considered, and addended by me as needed.  Delman Cheadle, MD MPH

## 2015-06-23 ENCOUNTER — Ambulatory Visit
Admission: RE | Admit: 2015-06-23 | Discharge: 2015-06-23 | Disposition: A | Discharge status: Home / Self Care | Payer: BC Managed Care – PPO | Source: Ambulatory Visit | Attending: Family Medicine | Admitting: Family Medicine

## 2015-06-23 DIAGNOSIS — R5383 Other fatigue: Secondary | ICD-10-CM

## 2015-06-23 DIAGNOSIS — R590 Localized enlarged lymph nodes: Secondary | ICD-10-CM

## 2015-06-23 LAB — THYROID PANEL WITH TSH
Free Thyroxine Index: 1.8 (ref 1.4–3.8)
T3 Uptake: 27 % (ref 22–35)
T4 TOTAL: 6.8 ug/dL (ref 4.5–12.0)
TSH: 0.86 m[IU]/L

## 2015-06-23 LAB — PATHOLOGIST SMEAR REVIEW

## 2015-06-23 LAB — FERRITIN: FERRITIN: 7 ng/mL — AB (ref 10–232)

## 2015-06-28 ENCOUNTER — Ambulatory Visit (INDEPENDENT_AMBULATORY_CARE_PROVIDER_SITE_OTHER): Payer: BC Managed Care – PPO | Admitting: Family Medicine

## 2015-06-28 ENCOUNTER — Ambulatory Visit (INDEPENDENT_AMBULATORY_CARE_PROVIDER_SITE_OTHER): Payer: BC Managed Care – PPO

## 2015-06-28 VITALS — BP 104/72 | HR 90 | Temp 98.0°F | Resp 16 | Ht 62.5 in | Wt 147.8 lb

## 2015-06-28 DIAGNOSIS — S8002XA Contusion of left knee, initial encounter: Secondary | ICD-10-CM | POA: Diagnosis not present

## 2015-06-28 DIAGNOSIS — M79642 Pain in left hand: Secondary | ICD-10-CM

## 2015-06-28 DIAGNOSIS — S60419A Abrasion of unspecified finger, initial encounter: Secondary | ICD-10-CM | POA: Diagnosis not present

## 2015-06-28 DIAGNOSIS — S60222A Contusion of left hand, initial encounter: Secondary | ICD-10-CM

## 2015-06-28 DIAGNOSIS — W19XXXA Unspecified fall, initial encounter: Secondary | ICD-10-CM | POA: Diagnosis not present

## 2015-06-28 NOTE — Progress Notes (Signed)
Patient ID: Paula [name], female    DOB: [birth year]  Age: 45 y.o. [name]: [id number]  Chief Complaint  Patient presents with  . Hand Injury    LEFT   FALL  . Knee Injury    LEFT FALL    Subjective:   At approximately 4 AM patient was going out to her car and fell after hitting something on the sidewalk. She landed on her left hand and left knee. The left hand continues to be painful in the midportion of the hand at the base of the third finger. She got a little abrasion lateral to the nail of the left index finger and on her left knee. Left knee also hurts.  Current allergies, medications, problem list, past/family and social histories reviewed.  Objective:  BP 104/72 mmHg  Pulse 90  Temp(Src) 98 F (36.7 C) (Oral)  Resp 16  Ht 5' 2.5" (1.588 m)  Wt 147 lb 12.8 oz (67.042 kg)  BMI 26.59 kg/m2  SpO2 98%  Left hand is tender just proximal to the left third MCP joint. Pain on flexion or extension. Neurovascular intact. The index finger of that hand also has a little corn flap of skin just lateral to the fingernail along the cuticle. It is a fairly superficial abrasion. The left knee has a small abrasion and slight swelling just below and lateral to the patella. It is tender there on the proximal fibular region. Able to walk well.  Assessment & Plan:   Assessment: 1. Fall, initial encounter   2. Pain of left hand   3. Abrasion of finger of left hand, initial encounter   4. Contusion, hand, left, initial encounter   5. Contusion of left knee, initial encounter       Plan: Will x-ray left hand. The abrasion of the fingertip should heal up well without any suturing.  No fracture noted on x-ray. Radiology reading is still pending.  Orders Placed This Encounter  Procedures  . DG Hand Complete Left    Order Specific Question:  Reason for Exam (SYMPTOM  OR DIAGNOSIS REQUIRED)    Answer:  pain    Order Specific Question:  Is the patient pregnant?    Answer:  No     Comments:  lmp  april 7    Order Specific Question:  Preferred imaging location?    Answer:  External    No orders of the defined types were placed in this encounter.         Patient Instructions       IF you received an x-ray today, you will receive an invoice from [facility]. Please contact [facility] at [phone] with questions or concerns regarding your invoice.   IF you received labwork today, you will receive an invoice from [employer]. Please contact [facility] at [phone] with questions or concerns regarding your invoice.   Our billing staff will not be able to assist you with questions regarding bills from these companies.  You will be contacted with the lab results as soon as they are available. The fastest way to get your results is to activate your My Chart account. Instructions are located on the last page of this paperwork. If you have not heard from [place] regarding the results in 2 weeks, please contact this office.          No Follow-up on file.   Puja Caffey, MD 06/28/2015

## 2015-06-28 NOTE — Patient Instructions (Addendum)
No fracture noted. If the radiologist sees anything differently we will let you know  Keep the wound replaces clean and dry  Apply ice to the hand and knee for about 15 minutes 3 or 4 times daily for a couple of days which will help reduce swelling and pain  Take ibuprofen 200 mg 3 pills 3 times daily as needed for pain  Return if further problems  IF you received an x-ray today, you will receive an invoice from [facility]. Please contact [facility] at [phone] with questions or concerns regarding your invoice.   IF you received labwork today, you will receive an invoice from [employer]. Please contact [facility] at [phone] with questions or concerns regarding your invoice.   Our billing staff will not be able to assist you with questions regarding bills from these companies.  You will be contacted with the lab results as soon as they are available. The fastest way to get your results is to activate your My Chart account. Instructions are located on the last page of this paperwork. If you have not heard from [place] regarding the results in 2 weeks, please contact this office.

## 2015-06-29 ENCOUNTER — Telehealth: Payer: Self-pay

## 2015-06-29 ENCOUNTER — Other Ambulatory Visit: Payer: Self-pay | Admitting: Family Medicine

## 2015-06-29 DIAGNOSIS — R1310 Dysphagia, unspecified: Secondary | ICD-10-CM

## 2015-06-29 NOTE — Telephone Encounter (Signed)
Pt came by about note because she had not heard back from [place].  Clinical has completed the note for her.

## 2015-06-29 NOTE — Telephone Encounter (Signed)
Pt is needing a work note for tomorrow and Friday and will return to work on Monday   Best number [phone]

## 2015-06-29 NOTE — Progress Notes (Signed)
Referral placed.

## 2015-09-15 ENCOUNTER — Other Ambulatory Visit: Payer: Self-pay | Admitting: Obstetrics and Gynecology

## 2015-09-15 DIAGNOSIS — R928 Other abnormal and inconclusive findings on diagnostic imaging of breast: Secondary | ICD-10-CM

## 2015-09-22 ENCOUNTER — Ambulatory Visit
Admission: RE | Admit: 2015-09-22 | Discharge: 2015-09-22 | Disposition: A | Discharge status: Home / Self Care | Payer: BC Managed Care – PPO | Source: Ambulatory Visit | Attending: Obstetrics and Gynecology | Admitting: Obstetrics and Gynecology

## 2015-09-22 DIAGNOSIS — R928 Other abnormal and inconclusive findings on diagnostic imaging of breast: Secondary | ICD-10-CM

## 2016-04-06 ENCOUNTER — Ambulatory Visit (INDEPENDENT_AMBULATORY_CARE_PROVIDER_SITE_OTHER): Payer: BC Managed Care – PPO | Admitting: Family Medicine

## 2016-04-06 ENCOUNTER — Ambulatory Visit (INDEPENDENT_AMBULATORY_CARE_PROVIDER_SITE_OTHER): Payer: BC Managed Care – PPO

## 2016-04-06 VITALS — BP 134/76 | HR 84 | Temp 99.6°F | Resp 16 | Ht 62.5 in | Wt 142.0 lb

## 2016-04-06 DIAGNOSIS — M25511 Pain in right shoulder: Secondary | ICD-10-CM

## 2016-04-06 DIAGNOSIS — M5442 Lumbago with sciatica, left side: Secondary | ICD-10-CM | POA: Diagnosis not present

## 2016-04-06 DIAGNOSIS — Z23 Encounter for immunization: Secondary | ICD-10-CM

## 2016-04-06 DIAGNOSIS — M5441 Lumbago with sciatica, right side: Secondary | ICD-10-CM

## 2016-04-06 NOTE — Progress Notes (Signed)
Chief Complaint  Patient presents with  . Back Pain    Lower back x 3 days  . 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 Allergy   . Anemia     Current Outpatient Prescriptions  Medication Sig Dispense Refill  . ferrous sulfate 324 (65 Fe) MG TBEC Take 1 tablet (325 mg total) by mouth daily. 30 tablet 2  . levocetirizine (XYZAL) 5 MG tablet Take 5 mg by mouth every evening.    . montelukast (SINGULAIR) 10 MG tablet Take 10 mg by mouth at bedtime.    . Multiple Vitamins-Minerals (MULTIVITAMIN WITH MINERALS) tablet Take 1 tablet by mouth daily.     No current facility-administered medications for this visit.     Allergies: No Known Allergies  Past Surgical History:  Procedure Laterality Date  . NO PAST SURGERIES      Social History   Social History  . Marital status: Single    Spouse name: N/A  . Number of children: 3  . Years of education: 12   Occupational History  . [employer]   Social History Main Topics  . Smoking status: Never Smoker  . Smokeless tobacco: Never Used  . Alcohol use No  . Drug use: No  . Sexual activity: Not Currently   Other Topics Concern  . None    Social History Narrative  . None    Review of Systems  Constitutional: Negative for chills and fever.  Respiratory: Negative for cough, shortness of breath and wheezing.   Cardiovascular: Negative for chest pain and palpitations.  Gastrointestinal: Negative for abdominal pain, heartburn, nausea and vomiting.  Genitourinary: Negative for dysuria, flank pain and frequency.  Musculoskeletal: Positive for back pain.    Objective: Vitals:   04/06/16 1710  BP: 134/76  Pulse: 84  Resp: 16  Temp: 99.6 F (37.6 C)  TempSrc: Oral  SpO2: 100%  Weight: 142 lb (64.4 kg)  Height: 5' 2.5" (1.588 m)    Physical Exam  Cardiovascular: Normal rate, regular rhythm and normal heart sounds.   Pulmonary/Chest: Effort normal and breath sounds normal. No respiratory distress. She has no wheezes.  Musculoskeletal: She exhibits tenderness.       Lumbar back: She exhibits spasm. She exhibits normal range of motion, no tenderness, no bony tenderness, no swelling, no edema, no deformity, no laceration and no pain.    Assessment and Plan Paula [name] was seen today for back pain and leg pain.  Diagnoses and all orders for this visit:  Need for prophylactic vaccination and inoculation against influenza -  Flu Vaccine QUAD 36+ mos PF IM (Fluarix & Fluzone Quad PF)  Pain in joint of right shoulder- discussed that this is likely mild arthritis -     DG Shoulder Right  Acute midline low back pain with bilateral sciatica- no joint deformity on xray Will treat with ibuprofen and flexeril Work note provided -     DG Lumbar Spine Complete     Belvia Gotschall A Nakota Ackert

## 2016-04-06 NOTE — Progress Notes (Signed)
Please let the patient know that her shoulder xray showed some mild arthritis but no fracture or dislocation.

## 2016-04-06 NOTE — Progress Notes (Signed)
Please let the patient know that her back exam was normal so she should take the medications prescribed. She has sciatica which can resolve or get worse.  If it does not resolve then she should return to clinic in 6 weeks.

## 2016-04-06 NOTE — Patient Instructions (Addendum)
Back Pain, Adult Introduction Back pain is very common. The pain often gets better over time. The cause of back pain is usually not dangerous. Most people can learn to manage their back pain on their own. Follow these instructions at home: Watch your back pain for any changes. The following actions may help to lessen any pain you are feeling:  Stay active. Start with short walks on flat ground if you can. Try to walk farther each day.  Exercise regularly as told by your doctor. Exercise helps your back heal faster. It also helps avoid future injury by keeping your muscles strong and flexible.  Do not sit, drive, or stand in one place for more than 30 minutes.  Do not stay in bed. Resting more than 1-2 days can slow down your recovery.  Be careful when you bend or lift an object. Use good form when lifting:  Bend at your knees.  Keep the object close to your body.  Do not twist.  Sleep on a firm mattress. Lie on your side, and bend your knees. If you lie on your back, put a pillow under your knees.  Take medicines only as told by your doctor.  Put ice on the injured area.  Put ice in a plastic bag.  Place a towel between your skin and the bag.  Leave the ice on for 20 minutes, 2-3 times a day for the first 2-3 days. After that, you can switch between ice and heat packs.  Avoid feeling anxious or stressed. Find good ways to deal with stress, such as exercise.  Maintain a healthy weight. Extra weight puts stress on your back. Contact a doctor if:  You have pain that does not go away with rest or medicine.  You have worsening pain that goes down into your legs or buttocks.  You have pain that does not get better in one week.  You have pain at night.  You lose weight.  You have a fever or chills. Get help right away if:  You cannot control when you poop (bowel movement) or pee (urinate).  Your arms or legs feel weak.  Your arms or legs lose feeling  (numbness).  You feel sick to your stomach (nauseous) or throw up (vomit).  You have belly (abdominal) pain.  You feel like you may pass out (faint). This information is not intended to replace advice given to you by your health care provider. Make sure you discuss any questions you have with your health care provider. Document Released: 08/15/2007 Document Revised: 08/04/2015 Document Reviewed: 06/30/2013  2017 Elsevier  

## 2016-04-07 ENCOUNTER — Telehealth: Payer: Self-pay

## 2016-04-07 NOTE — Telephone Encounter (Signed)
Paula [name] - Pt called because she had no scripts called into the pharmacy after her visit yesterday. Please call her at [phone] when completed.

## 2016-04-07 NOTE — Telephone Encounter (Signed)
I see no meds described or rx yesterday, please advise

## 2016-04-08 MED ORDER — IBUPROFEN 600 MG PO TABS: 600.0000 mg | ORAL_TABLET | Freq: Three times a day (TID) | ORAL | 0 refills | Status: DC | PRN

## 2016-04-08 MED ORDER — CYCLOBENZAPRINE HCL 10 MG PO TABS: 10.0000 mg | ORAL_TABLET | Freq: Three times a day (TID) | ORAL | 0 refills | Status: DC | PRN

## 2016-04-13 ENCOUNTER — Ambulatory Visit (INDEPENDENT_AMBULATORY_CARE_PROVIDER_SITE_OTHER): Payer: BC Managed Care – PPO | Admitting: Family Medicine

## 2016-04-13 VITALS — BP 122/70 | HR 94 | Temp 98.3°F | Resp 18 | Ht 62.5 in | Wt 144.0 lb

## 2016-04-13 DIAGNOSIS — J209 Acute bronchitis, unspecified: Secondary | ICD-10-CM | POA: Diagnosis not present

## 2016-04-13 DIAGNOSIS — R062 Wheezing: Secondary | ICD-10-CM | POA: Diagnosis not present

## 2016-04-13 DIAGNOSIS — M545 Low back pain, unspecified: Secondary | ICD-10-CM

## 2016-04-13 DIAGNOSIS — M25511 Pain in right shoulder: Secondary | ICD-10-CM | POA: Diagnosis not present

## 2016-04-13 MED ORDER — DOXYCYCLINE HYCLATE 100 MG PO TABS
100.0000 mg | ORAL_TABLET | Freq: Two times a day (BID) | ORAL | 0 refills | Status: DC
Start: 2016-04-13 — End: 2016-11-13

## 2016-04-13 MED ORDER — ALBUTEROL SULFATE HFA 108 (90 BASE) MCG/ACT IN AERS: 2.0000 | INHALATION_SPRAY | Freq: Four times a day (QID) | RESPIRATORY_TRACT | 0 refills | Status: DC | PRN

## 2016-04-13 MED ORDER — FLUTICASONE PROPIONATE 50 MCG/ACT NA SUSP: 2.0000 | Freq: Every day | NASAL | 6 refills | Status: DC

## 2016-04-13 MED ORDER — ALBUTEROL SULFATE (2.5 MG/3ML) 0.083% IN NEBU
2.5000 mg | INHALATION_SOLUTION | Freq: Once | RESPIRATORY_TRACT | Status: AC
  Administered 2016-04-13: 2.5 mg via RESPIRATORY_TRACT

## 2016-04-13 MED ORDER — PREDNISONE 20 MG PO TABS: 40.0000 mg | ORAL_TABLET | Freq: Every day | ORAL | 0 refills | Status: AC

## 2016-04-13 NOTE — Patient Instructions (Addendum)
   IF you received an x-ray today, you will receive an invoice from [facility]. Please contact  Radiology at [phone] with questions or concerns regarding your invoice.   IF you received labwork today, you will receive an invoice from [facility]. Please contact [facility] at [phone] with questions or concerns regarding your invoice.   Our billing staff will not be able to assist you with questions regarding bills from these companies.  You will be contacted with the lab results as soon as they are available. The fastest way to get your results is to activate your My Chart account. Instructions are located on the last page of this paperwork. If you have not heard from us regarding the results in 2 weeks, please contact this office.      Acute Bronchitis, Adult Acute bronchitis is sudden (acute) swelling of the air tubes (bronchi) in the lungs. Acute bronchitis causes these tubes to fill with mucus, which can make it hard to breathe. It can also cause coughing or wheezing. In adults, acute bronchitis usually goes away within 2 weeks. A cough caused by bronchitis may last up to 3 weeks. Smoking, allergies, and asthma can make the condition worse. Repeated episodes of bronchitis may cause further lung problems, such as chronic obstructive pulmonary disease (COPD). What are the causes? This condition can be caused by germs and by substances that irritate the lungs, including:  Cold and flu viruses. This condition is most often caused by the same virus that causes a cold.  Bacteria.  Exposure to tobacco smoke, dust, fumes, and air pollution.  What increases the risk? This condition is more likely to develop in people who:  Have close contact with someone with acute bronchitis.  Are exposed to lung irritants, such as tobacco smoke, dust, fumes, and vapors.  Have a weak immune system.  Have a respiratory condition such as asthma.  What are the signs or  symptoms? Symptoms of this condition include:  A cough.  Coughing up clear, yellow, or green mucus.  Wheezing.  Chest congestion.  Shortness of breath.  A fever.  Body aches.  Chills.  A sore throat.  How is this diagnosed? This condition is usually diagnosed with a physical exam. During the exam, your health care provider may order tests, such as chest X-rays, to rule out other conditions. He or she may also:  Test a sample of your mucus for bacterial infection.  Check the level of oxygen in your blood. This is done to check for pneumonia.  Do a chest X-ray or lung function testing to rule out pneumonia and other conditions.  Perform blood tests.  Your health care provider will also ask about your symptoms and medical history. How is this treated? Most cases of acute bronchitis clear up over time without treatment. Your health care provider may recommend:  Drinking more fluids. Drinking more makes your mucus thinner, which may make it easier to breathe.  Taking a medicine for a fever or cough.  Taking an antibiotic medicine.  Using an inhaler to help improve shortness of breath and to control a cough.  Using a cool mist vaporizer or humidifier to make it easier to breathe.  Follow these instructions at home: Medicines  Take over-the-counter and prescription medicines only as told by your health care provider.  If you were prescribed an antibiotic, take it as told by your health care provider. Do not stop taking the antibiotic even if you start to feel better. General instructions    Get plenty of rest.  Drink enough fluids to keep your urine clear or pale yellow.  Avoid smoking and secondhand smoke. Exposure to cigarette smoke or irritating chemicals will make bronchitis worse. If you smoke and you need help quitting, ask your health care provider. Quitting smoking will help your lungs heal faster.  Use an inhaler, cool mist vaporizer, or humidifier as told  by your health care provider.  Keep all follow-up visits as told by your health care provider. This is important. How is this prevented? To lower your risk of getting this condition again:  Wash your hands often with soap and water. If soap and water are not available, use hand sanitizer.  Avoid contact with people who have cold symptoms.  Try not to touch your hands to your mouth, nose, or eyes.  Make sure to get the flu shot every year.  Contact a health care provider if:  Your symptoms do not improve in 2 weeks of treatment. Get help right away if:  You cough up blood.  You have chest pain.  You have severe shortness of breath.  You become dehydrated.  You faint or keep feeling like you are going to faint.  You keep vomiting.  You have a severe headache.  Your fever or chills gets worse. This information is not intended to replace advice given to you by your health care provider. Make sure you discuss any questions you have with your health care provider. Document Released: 04/05/2004 Document Revised: 09/21/2015 Document Reviewed: 08/17/2015 Elsevier Interactive Patient Education  2017 Elsevier Inc.  

## 2016-04-13 NOTE — Progress Notes (Signed)
Chief Complaint  Patient presents with  . Sinusitis    HPI  Sinusitis Pt reports that she has been having one week of cough, congestion, chest tightness and wheezing She reports that she has some chills She denies n/v/d She denies ear pain or headaches  Right shoulder pain She reports that she has continued pain due to her repetitive use and difficulty with sleeping She has continued tightness with difficulty with range of motion  Low Back Pain She reports that her low back pain has improved with nsaids She states that she feels less low back tightness  Past Medical History:  Diagnosis Date  . Allergy   . Anemia     Current Outpatient Prescriptions  Medication Sig Dispense Refill  . cyclobenzaprine (FLEXERIL) 10 MG tablet Take 1 tablet (10 mg total) by mouth 3 (three) times daily as needed for muscle spasms. 30 tablet 0  . ferrous sulfate 324 (65 Fe) MG TBEC Take 1 tablet (325 mg total) by mouth daily. 30 tablet 2  . ibuprofen (ADVIL,MOTRIN) 600 MG tablet Take 1 tablet (600 mg total) by mouth every 8 (eight) hours as needed. 30 tablet 0  . levocetirizine (XYZAL) 5 MG tablet Take 5 mg by mouth every evening.    . montelukast (SINGULAIR) 10 MG tablet Take 10 mg by mouth at bedtime.    . Multiple Vitamins-Minerals (MULTIVITAMIN WITH MINERALS) tablet Take 1 tablet by mouth daily.    Marland Kitchen albuterol (PROVENTIL HFA;VENTOLIN HFA) 108 (90 Base) MCG/ACT inhaler Inhale 2 puffs into the lungs every 6 (six) hours as needed for wheezing or shortness of breath. 1 Inhaler 0  . doxycycline (VIBRA-TABS) 100 MG tablet Take 1 tablet (100 mg total) by mouth 2 (two) times daily. 20 tablet 0  . fluticasone (FLONASE) 50 MCG/ACT nasal spray Place 2 sprays into both nostrils daily. 16 g 6  . predniSONE (DELTASONE) 20 MG tablet Take 2 tablets (40 mg total) by mouth daily with breakfast. 8 tablet 0   No current facility-administered medications for this visit.     Allergies: No Known Allergies  Past  Surgical History:  Procedure Laterality Date  . NO PAST SURGERIES      Social History   Social History  . Marital status: Single    Spouse name: N/Paula  . Number of children: 3  . Years of education: 12   Occupational History  . [employer]   Social History Main Topics  . Smoking status: Never Smoker  . Smokeless tobacco: Never Used  . Alcohol use No  . Drug use: No  . Sexual activity: Not Currently   Other Topics Concern  . None   Social History Narrative  . None    ROS  Objective: Vitals:   04/13/16 1607  BP: 122/70  Pulse: 94  Resp: 18  Temp: 98.3 F (36.8 C)  TempSrc: Oral  SpO2: 100%  Weight: 144 lb (65.3 kg)  Height: 5' 2.5" (1.588 m)    Physical Exam  Constitutional: She appears well-developed and well-nourished.  HENT:  Head: Normocephalic and atraumatic.  Right Ear: External ear normal.  Left Ear: External ear normal.  Mouth/Throat: Oropharynx is clear and moist.  Eyes: Conjunctivae and EOM are normal.  Neck: Normal range of motion. No thyromegaly present.  Cardiovascular: Normal rate, regular rhythm and normal heart sounds.   Pulmonary/Chest: Effort normal. No respiratory distress. She has wheezes.  Musculoskeletal:       Right shoulder: She exhibits decreased range of motion and tenderness.  She exhibits no swelling, no effusion, no crepitus and normal strength.       Lumbar back: Normal. She exhibits normal range of motion, no tenderness, no bony tenderness, no swelling, no edema, no deformity, no laceration, no pain and no spasm.    Assessment and Plan Paula [name] was seen today for sinusitis.  Diagnoses and all orders for this visit:  Wheezing Acute bronchitis, unspecified organism- will treat with antibiotics, steroid and albuterol -     albuterol (PROVENTIL) (2.5 MG/3ML) 0.083% nebulizer solution 2.5 mg; Take 3 mLs (2.5 mg total) by nebulization once. -     albuterol (PROVENTIL) (2.5 MG/3ML) 0.083% nebulizer solution 2.5 mg; Take 3 mLs (2.5 mg  total) by nebulization once. -     doxycycline (VIBRA-TABS) 100 MG tablet; Take 1 tablet (100 mg total) by mouth 2 (two) times daily. -     fluticasone (FLONASE) 50 MCG/ACT nasal spray; Place 2 sprays into both nostrils daily. -     predniSONE (DELTASONE) 20 MG tablet; Take 2 tablets (40 mg total) by mouth daily with breakfast. -     albuterol (PROVENTIL HFA;VENTOLIN HFA) 108 (90 Base) MCG/ACT inhaler; Inhale 2 puffs into the lungs every 6 (six) hours as needed for wheezing or shortness of breath.  Acute pain of right shoulder Acute midline low back pain without sciatica -  Shoulder pain is persisting  Steroids should help       Paula [name] Paula [name]

## 2016-04-16 NOTE — Telephone Encounter (Signed)
fyi

## 2016-04-16 NOTE — Telephone Encounter (Signed)
Pt needs a note for work to switch to 2nd shift due to no sleep she has dropped off some forms for fmla but she doesn't want to file for disability unless she has to.  Please advise before filing out forms.. [phone]

## 2016-04-17 NOTE — Telephone Encounter (Signed)
PATIENT CALLED BACK TO SAY SHE DECIDED NOT TO FILE FMLA PAPER WORK SO WE CAN JUST SHRED THE FORMS SHE DROPPED OFF ON Monday. BEST PHONE IF QUESTIONS [phone] (CELL) MBC

## 2016-11-09 NOTE — H&P (Addendum)
Paula [name] is an 46 y.o. female presents for definitive treatment of menorrhagia and fibroids.    Menstrual History: No LMP recorded.    Past Medical History:  Diagnosis Date  . Allergy   . Anemia     Past Surgical History:  Procedure Laterality Date  . NO PAST SURGERIES    SVD x 3  Family History  Problem Relation Age of Onset  . Hypertension Mother   . Hypertension Sister     Social History:  reports that she has never smoked. She has never used smokeless tobacco. She reports that she does not drink alcohol or use drugs.  Allergies: No Known Allergies  No prescriptions prior to admission.    ROS  AF, VSS Physical Exam Gen - NAD CV - RRR Lungs - clear Abd - soft, NT Ext - NT PV - uterus mobile NT  PVUS:  Multiple fibroids 3.5, 2.5, 1.7 & 1.2cm.  No adnexal masses or free fluid  Assessment/Plan:  Fibroids/Menorrhagia LAVH  Possible TAH R/b/a discussed, questions answered, informed consent  Tyge Somers 11/09/2016, 10:46 AM

## 2016-11-14 NOTE — Patient Instructions (Signed)
Your procedure is scheduled on:  Monday, Sept. 17, 2018  Enter through the [employer] [facility] at:  6:00 AM  Pick up the phone at the desk and dial [phone].  Call this number if you have problems the morning of surgery: [phone].  Remember: Do NOT eat food or drink after:  Midnight Sunday  Take these medicines the morning of surgery with a SIP OF WATER:  None  Bring Asthma Inhaler day of surgery  Stop ALL herbal medications at this time  Do NOT smoke the day of surgery.  Do NOT wear jewelry (body piercing), metal hair clips/bobby pins, make-up, artifical eyelashes or nail polish. Do NOT wear lotions, powders, or perfumes.  You may wear deodorant. Do NOT shave for 48 hours prior to surgery. Do NOT bring valuables to the hospital. Contacts, dentures, or bridgework may not be worn into surgery.  Leave suitcase in car.  After surgery it may be brought to your room.  For patients admitted to the hospital, checkout time is 11:00 AM the day of discharge.  Bring a copy of your healthcare power of attorney and living will documents.

## 2016-11-15 ENCOUNTER — Encounter (HOSPITAL_COMMUNITY)
Admission: RE | Admit: 2016-11-15 | Discharge: 2016-11-15 | Disposition: A | Discharge status: Home / Self Care | Payer: BC Managed Care – PPO | Source: Ambulatory Visit | Attending: Obstetrics and Gynecology | Admitting: Obstetrics and Gynecology

## 2016-11-15 ENCOUNTER — Encounter (HOSPITAL_COMMUNITY): Payer: Self-pay

## 2016-11-15 DIAGNOSIS — Z01812 Encounter for preprocedural laboratory examination: Secondary | ICD-10-CM | POA: Insufficient documentation

## 2016-11-15 DIAGNOSIS — D649 Anemia, unspecified: Secondary | ICD-10-CM | POA: Diagnosis not present

## 2016-11-15 DIAGNOSIS — D259 Leiomyoma of uterus, unspecified: Secondary | ICD-10-CM | POA: Insufficient documentation

## 2016-11-15 DIAGNOSIS — Z8249 Family history of ischemic heart disease and other diseases of the circulatory system: Secondary | ICD-10-CM | POA: Insufficient documentation

## 2016-11-15 LAB — CBC
HEMATOCRIT: 30.2 % — AB (ref 36.0–46.0)
HEMOGLOBIN: 9.3 g/dL — AB (ref 12.0–15.0)
MCH: 23 pg — ABNORMAL LOW (ref 26.0–34.0)
MCHC: 30.8 g/dL (ref 30.0–36.0)
MCV: 74.8 fL — AB (ref 78.0–100.0)
Platelets: 278 10*3/uL (ref 150–400)
RBC: 4.04 MIL/uL (ref 3.87–5.11)
RDW: 17.5 % — AB (ref 11.5–15.5)
WBC: 4.9 10*3/uL (ref 4.0–10.5)

## 2016-11-15 LAB — ABO/RH: ABO/RH(D): O POS

## 2016-11-15 LAB — TYPE AND SCREEN
ABO/RH(D): O POS
ANTIBODY SCREEN: NEGATIVE

## 2016-11-26 ENCOUNTER — Ambulatory Visit (HOSPITAL_COMMUNITY): Payer: BC Managed Care – PPO | Admitting: Anesthesiology

## 2016-11-26 ENCOUNTER — Inpatient Hospital Stay (HOSPITAL_COMMUNITY)
Admission: AD | Admit: 2016-11-26 | Discharge: 2016-11-27 | DRG: 743 | Disposition: A | Discharge status: Home / Self Care | Payer: BC Managed Care – PPO | Source: Ambulatory Visit | Attending: Obstetrics and Gynecology | Admitting: Obstetrics and Gynecology

## 2016-11-26 ENCOUNTER — Encounter (HOSPITAL_COMMUNITY)
Admission: AD | Disposition: A | Discharge status: Home / Self Care | Payer: Self-pay | Source: Ambulatory Visit | Attending: Obstetrics and Gynecology

## 2016-11-26 ENCOUNTER — Encounter (HOSPITAL_COMMUNITY): Payer: Self-pay

## 2016-11-26 DIAGNOSIS — D649 Anemia, unspecified: Secondary | ICD-10-CM | POA: Diagnosis present

## 2016-11-26 DIAGNOSIS — N920 Excessive and frequent menstruation with regular cycle: Secondary | ICD-10-CM | POA: Diagnosis present

## 2016-11-26 DIAGNOSIS — D259 Leiomyoma of uterus, unspecified: Secondary | ICD-10-CM | POA: Diagnosis present

## 2016-11-26 DIAGNOSIS — D219 Benign neoplasm of connective and other soft tissue, unspecified: Secondary | ICD-10-CM | POA: Diagnosis present

## 2016-11-26 HISTORY — PX: ABDOMINAL HYSTERECTOMY: SHX81

## 2016-11-26 LAB — PREGNANCY, URINE: Preg Test, Ur: NEGATIVE

## 2016-11-26 SURGERY — HYSTERECTOMY, ABDOMINAL
Anesthesia: General | Site: Abdomen

## 2016-11-26 MED ORDER — LACTATED RINGERS IV SOLN: INTRAVENOUS | Status: DC

## 2016-11-26 MED ORDER — HYDROMORPHONE HCL 1 MG/ML IJ SOLN
INTRAMUSCULAR | Status: DC | PRN
  Administered 2016-11-26 (×2): 1 mg via INTRAVENOUS

## 2016-11-26 MED ORDER — SCOPOLAMINE 1 MG/3DAYS TD PT72
1.0000 | MEDICATED_PATCH | Freq: Once | TRANSDERMAL | Status: DC
  Administered 2016-11-26: 1.5 mg via TRANSDERMAL

## 2016-11-26 MED ORDER — CEFOTETAN DISODIUM-DEXTROSE 2-2.08 GM-% IV SOLR
2.0000 g | INTRAVENOUS | Status: AC
  Administered 2016-11-26: 2 g via INTRAVENOUS

## 2016-11-26 MED ORDER — SODIUM CHLORIDE 0.9 % IR SOLN
Status: DC | PRN
  Administered 2016-11-26: 2000 mL

## 2016-11-26 MED ORDER — SUGAMMADEX SODIUM 200 MG/2ML IV SOLN
INTRAVENOUS | Status: DC | PRN
  Administered 2016-11-26: 130 mg via INTRAVENOUS

## 2016-11-26 MED ORDER — HYDROMORPHONE HCL 1 MG/ML IJ SOLN
INTRAMUSCULAR | Status: AC
  Filled 2016-11-26: qty 1

## 2016-11-26 MED ORDER — FENTANYL CITRATE (PF) 250 MCG/5ML IJ SOLN
INTRAMUSCULAR | Status: AC
  Filled 2016-11-26: qty 5

## 2016-11-26 MED ORDER — ONDANSETRON HCL 4 MG PO TABS: 4.0000 mg | ORAL_TABLET | Freq: Four times a day (QID) | ORAL | Status: DC | PRN

## 2016-11-26 MED ORDER — LIDOCAINE HCL (CARDIAC) 20 MG/ML IV SOLN
INTRAVENOUS | Status: DC | PRN
  Administered 2016-11-26: 80 mg via INTRAVENOUS

## 2016-11-26 MED ORDER — SCOPOLAMINE 1 MG/3DAYS TD PT72
MEDICATED_PATCH | TRANSDERMAL | Status: AC
  Administered 2016-11-26: 1.5 mg via TRANSDERMAL
  Filled 2016-11-26: qty 1

## 2016-11-26 MED ORDER — DIPHENHYDRAMINE HCL 12.5 MG/5ML PO ELIX: 12.5000 mg | ORAL_SOLUTION | Freq: Four times a day (QID) | ORAL | Status: DC | PRN

## 2016-11-26 MED ORDER — GLYCOPYRROLATE 0.2 MG/ML IJ SOLN
INTRAMUSCULAR | Status: DC | PRN
  Administered 2016-11-26: 0.1 mg via INTRAVENOUS

## 2016-11-26 MED ORDER — MIDAZOLAM HCL 2 MG/2ML IJ SOLN
INTRAMUSCULAR | Status: DC | PRN
  Administered 2016-11-26: 2 mg via INTRAVENOUS

## 2016-11-26 MED ORDER — INFLUENZA VAC SPLIT QUAD 0.5 ML IM SUSY
0.5000 mL | PREFILLED_SYRINGE | INTRAMUSCULAR | Status: AC
  Administered 2016-11-27: 0.5 mL via INTRAMUSCULAR
  Filled 2016-11-26: qty 0.5

## 2016-11-26 MED ORDER — CEFOTETAN DISODIUM-DEXTROSE 2-2.08 GM-% IV SOLR
INTRAVENOUS | Status: AC
Start: 2016-11-26 — End: 2016-11-26
  Filled 2016-11-26: qty 50

## 2016-11-26 MED ORDER — ROCURONIUM BROMIDE 100 MG/10ML IV SOLN
INTRAVENOUS | Status: DC | PRN
  Administered 2016-11-26: 40 mg via INTRAVENOUS
  Administered 2016-11-26: 10 mg via INTRAVENOUS

## 2016-11-26 MED ORDER — DIPHENHYDRAMINE HCL 50 MG/ML IJ SOLN: 12.5000 mg | Freq: Four times a day (QID) | INTRAMUSCULAR | Status: DC | PRN

## 2016-11-26 MED ORDER — NALOXONE HCL 0.4 MG/ML IJ SOLN: 0.4000 mg | INTRAMUSCULAR | Status: DC | PRN

## 2016-11-26 MED ORDER — KETOROLAC TROMETHAMINE 30 MG/ML IJ SOLN
30.0000 mg | Freq: Three times a day (TID) | INTRAMUSCULAR | Status: DC
  Administered 2016-11-26 – 2016-11-27 (×3): 30 mg via INTRAVENOUS
  Filled 2016-11-26 (×3): qty 1

## 2016-11-26 MED ORDER — ONDANSETRON HCL 4 MG/2ML IJ SOLN
INTRAMUSCULAR | Status: DC | PRN
  Administered 2016-11-26: 4 mg via INTRAVENOUS

## 2016-11-26 MED ORDER — ONDANSETRON HCL 4 MG/2ML IJ SOLN
INTRAMUSCULAR | Status: AC
  Filled 2016-11-26: qty 2

## 2016-11-26 MED ORDER — DEXTROSE IN LACTATED RINGERS 5 % IV SOLN
INTRAVENOUS | Status: DC
  Administered 2016-11-26: 11:00:00 via INTRAVENOUS

## 2016-11-26 MED ORDER — SODIUM CHLORIDE 0.9% FLUSH: 9.0000 mL | INTRAVENOUS | Status: DC | PRN

## 2016-11-26 MED ORDER — DEXAMETHASONE SODIUM PHOSPHATE 4 MG/ML IJ SOLN
INTRAMUSCULAR | Status: DC | PRN
  Administered 2016-11-26: 10 mg via INTRAVENOUS

## 2016-11-26 MED ORDER — FLUTICASONE PROPIONATE 50 MCG/ACT NA SUSP
2.0000 | Freq: Every day | NASAL | Status: DC
  Filled 2016-11-26: qty 16

## 2016-11-26 MED ORDER — MENTHOL 3 MG MT LOZG: 1.0000 | LOZENGE | OROMUCOSAL | Status: DC | PRN

## 2016-11-26 MED ORDER — FENTANYL CITRATE (PF) 100 MCG/2ML IJ SOLN
25.0000 ug | INTRAMUSCULAR | Status: DC | PRN
  Administered 2016-11-26: 50 ug via INTRAVENOUS

## 2016-11-26 MED ORDER — LACTATED RINGERS IV SOLN
INTRAVENOUS | Status: DC
  Administered 2016-11-26 (×3): via INTRAVENOUS

## 2016-11-26 MED ORDER — ROCURONIUM BROMIDE 100 MG/10ML IV SOLN
INTRAVENOUS | Status: AC
  Filled 2016-11-26: qty 1

## 2016-11-26 MED ORDER — DEXAMETHASONE SODIUM PHOSPHATE 10 MG/ML IJ SOLN
INTRAMUSCULAR | Status: AC
  Filled 2016-11-26: qty 1

## 2016-11-26 MED ORDER — PHENYLEPHRINE HCL 10 MG/ML IJ SOLN
INTRAMUSCULAR | Status: DC | PRN
  Administered 2016-11-26: 80 ug via INTRAVENOUS

## 2016-11-26 MED ORDER — PROPOFOL 10 MG/ML IV BOLUS
INTRAVENOUS | Status: DC | PRN
  Administered 2016-11-26: 150 mg via INTRAVENOUS
  Administered 2016-11-26: 50 mg via INTRAVENOUS

## 2016-11-26 MED ORDER — METOCLOPRAMIDE HCL 5 MG/ML IJ SOLN: 10.0000 mg | Freq: Once | INTRAMUSCULAR | Status: DC | PRN

## 2016-11-26 MED ORDER — LIDOCAINE HCL (CARDIAC) 20 MG/ML IV SOLN
INTRAVENOUS | Status: AC
  Filled 2016-11-26: qty 5

## 2016-11-26 MED ORDER — MEPERIDINE HCL 25 MG/ML IJ SOLN: 6.2500 mg | INTRAMUSCULAR | Status: DC | PRN

## 2016-11-26 MED ORDER — SUGAMMADEX SODIUM 200 MG/2ML IV SOLN
INTRAVENOUS | Status: AC
  Filled 2016-11-26: qty 2

## 2016-11-26 MED ORDER — SODIUM CHLORIDE 0.9 % IV SOLN: INTRAVENOUS | Status: DC

## 2016-11-26 MED ORDER — OXYCODONE-ACETAMINOPHEN 5-325 MG PO TABS
1.0000 | ORAL_TABLET | ORAL | Status: DC | PRN
  Administered 2016-11-26: 1 via ORAL
  Filled 2016-11-26: qty 1

## 2016-11-26 MED ORDER — PHENYLEPHRINE 40 MCG/ML (10ML) SYRINGE FOR IV PUSH (FOR BLOOD PRESSURE SUPPORT)
PREFILLED_SYRINGE | INTRAVENOUS | Status: AC
  Filled 2016-11-26: qty 10

## 2016-11-26 MED ORDER — MIDAZOLAM HCL 2 MG/2ML IJ SOLN
INTRAMUSCULAR | Status: AC
  Filled 2016-11-26: qty 2

## 2016-11-26 MED ORDER — PROPOFOL 10 MG/ML IV BOLUS
INTRAVENOUS | Status: AC
  Filled 2016-11-26: qty 20

## 2016-11-26 MED ORDER — ONDANSETRON HCL 4 MG/2ML IJ SOLN: 4.0000 mg | Freq: Four times a day (QID) | INTRAMUSCULAR | Status: DC | PRN

## 2016-11-26 MED ORDER — FENTANYL CITRATE (PF) 100 MCG/2ML IJ SOLN
INTRAMUSCULAR | Status: DC | PRN
  Administered 2016-11-26: 100 ug via INTRAVENOUS
  Administered 2016-11-26 (×3): 50 ug via INTRAVENOUS

## 2016-11-26 MED ORDER — BUPIVACAINE HCL (PF) 0.25 % IJ SOLN
INTRAMUSCULAR | Status: AC
  Filled 2016-11-26: qty 30

## 2016-11-26 MED ORDER — FENTANYL CITRATE (PF) 100 MCG/2ML IJ SOLN
INTRAMUSCULAR | Status: AC
  Filled 2016-11-26: qty 2

## 2016-11-26 MED ORDER — HYDROMORPHONE 1 MG/ML IV SOLN
INTRAVENOUS | Status: DC
  Administered 2016-11-26: 2.2 mg via INTRAVENOUS
  Administered 2016-11-26: 11:00:00 via INTRAVENOUS
  Administered 2016-11-26: 0.8 mg via INTRAVENOUS
  Filled 2016-11-26: qty 25

## 2016-11-26 SURGICAL SUPPLY — 52 items
ADH SKN CLS APL DERMABOND .7 (GAUZE/BANDAGES/DRESSINGS) ×4
BLADE SURG 10 STRL SS (BLADE) ×3 IMPLANT
CABLE HIGH FREQUENCY MONO STRZ (ELECTRODE) IMPLANT
CANISTER SUCT 3000ML PPV (MISCELLANEOUS) ×4 IMPLANT
CLOTH BEACON ORANGE TIMEOUT ST (SAFETY) ×4 IMPLANT
COVER BACK TABLE 60X90IN (DRAPES) ×4 IMPLANT
DECANTER SPIKE VIAL GLASS SM (MISCELLANEOUS) IMPLANT
DERMABOND ADVANCED (GAUZE/BANDAGES/DRESSINGS) ×4
DERMABOND ADVANCED .7 DNX12 (GAUZE/BANDAGES/DRESSINGS) ×4 IMPLANT
DRAPE CESAREAN BIRTH W POUCH (DRAPES) ×3 IMPLANT
DRAPE WARM FLUID 44X44 (DRAPE) ×3 IMPLANT
DRSG OPSITE POSTOP 3X4 (GAUZE/BANDAGES/DRESSINGS) IMPLANT
DURAPREP 26ML APPLICATOR (WOUND CARE) ×4 IMPLANT
ELECT REM PT RETURN 9FT ADLT (ELECTROSURGICAL) ×4
ELECTRODE REM PT RTRN 9FT ADLT (ELECTROSURGICAL) ×1 IMPLANT
GLOVE BIO SURGEON STRL SZ 6.5 (GLOVE) ×6 IMPLANT
GLOVE BIO SURGEONS STRL SZ 6.5 (GLOVE) ×2
GLOVE BIOGEL PI IND STRL 6.5 (GLOVE) ×2 IMPLANT
GLOVE BIOGEL PI IND STRL 7.0 (GLOVE) ×10 IMPLANT
GLOVE BIOGEL PI INDICATOR 6.5 (GLOVE) ×2
GLOVE BIOGEL PI INDICATOR 7.0 (GLOVE) ×10
LEGGING LITHOTOMY PAIR STRL (DRAPES) ×4 IMPLANT
NS IRRIG 1000ML POUR BTL (IV SOLUTION) ×4 IMPLANT
PACK LAVH (CUSTOM PROCEDURE TRAY) ×4 IMPLANT
PACK ROBOTIC GOWN (GOWN DISPOSABLE) ×4 IMPLANT
PACK TRENDGUARD 450 HYBRID PRO (MISCELLANEOUS) ×1 IMPLANT
PACK TRENDGUARD 600 HYBRD PROC (MISCELLANEOUS) IMPLANT
PROTECTOR NERVE ULNAR (MISCELLANEOUS) ×8 IMPLANT
SEALER TISSUE G2 CVD JAW 45CM (ENDOMECHANICALS) ×4 IMPLANT
SET IRRIG TUBING LAPAROSCOPIC (IRRIGATION / IRRIGATOR) IMPLANT
SPONGE LAP 18X18 X RAY DECT (DISPOSABLE) ×6 IMPLANT
SUT MNCRL 0 MO-4 VIOLET 18 CR (SUTURE) ×5 IMPLANT
SUT MNCRL 0 VIOLET 6X18 (SUTURE) ×2 IMPLANT
SUT MON AB 2-0 CT1 36 (SUTURE) ×4 IMPLANT
SUT MON AB 3-0 SH 27 (SUTURE)
SUT MON AB 3-0 SH27 (SUTURE) IMPLANT
SUT MON AB-0 CT1 36 (SUTURE) ×3 IMPLANT
SUT MONOCRYL 0 6X18 (SUTURE) ×2
SUT MONOCRYL 0 MO 4 18  CR/8 (SUTURE) ×6
SUT PDS AB 0 CTX 60 (SUTURE) ×3 IMPLANT
SUT VIC AB 3-0 PS2 18 (SUTURE) ×4
SUT VIC AB 3-0 PS2 18XBRD (SUTURE) ×2 IMPLANT
SUT VIC AB 4-0 KS 27 (SUTURE) ×3 IMPLANT
SUT VIC AB 4-0 PS2 18 (SUTURE) ×3 IMPLANT
SUT VICRYL 0 UR6 27IN ABS (SUTURE) ×4 IMPLANT
TOWEL OR 17X24 6PK STRL BLUE (TOWEL DISPOSABLE) ×8 IMPLANT
TRAY FOLEY CATH SILVER 14FR (SET/KITS/TRAYS/PACK) ×4 IMPLANT
TRENDGUARD 450 HYBRID PRO PACK (MISCELLANEOUS) ×4
TRENDGUARD 600 HYBRID PROC PK (MISCELLANEOUS)
TROCAR OPTI TIP 5M 100M (ENDOMECHANICALS) ×4 IMPLANT
TROCAR XCEL NON-BLD 11X100MML (ENDOMECHANICALS) ×4 IMPLANT
WARMER LAPAROSCOPE (MISCELLANEOUS) ×4 IMPLANT

## 2016-11-26 NOTE — Anesthesia Preprocedure Evaluation (Signed)
Anesthesia Evaluation  Patient identified by [name], date of birth, ID band Patient awake    Reviewed: Allergy & Precautions, NPO status , Patient's Chart, lab work & pertinent test results  Airway Mallampati: II  TM Distance: >3 FB Neck ROM: Full    Dental no notable dental hx.    Pulmonary neg pulmonary ROS,    Pulmonary exam normal breath sounds clear to auscultation       Cardiovascular negative cardio ROS Normal cardiovascular exam Rhythm:Regular Rate:Normal     Neuro/Psych negative neurological ROS  negative psych ROS   GI/Hepatic negative GI ROS, Neg liver ROS,   Endo/Other  negative endocrine ROS  Renal/GU negative Renal ROS  negative genitourinary   Musculoskeletal negative musculoskeletal ROS (+)   Abdominal   Peds negative pediatric ROS (+)  Hematology  (+) anemia ,   Anesthesia Other Findings   Reproductive/Obstetrics negative OB ROS                             Anesthesia Physical Anesthesia Plan  ASA: II  Anesthesia Plan: General   Post-op Pain Management:    Induction: Intravenous  PONV Risk Score and Plan: 4 or greater and Ondansetron, Dexamethasone, Midazolam, Scopolamine patch - Pre-op and Treatment may vary due to age or medical condition  Airway Management Planned: Oral ETT  Additional Equipment:   Intra-op Plan:   Post-operative Plan: Extubation in OR  Informed Consent: I have reviewed the patients History and Physical, chart, labs and discussed the procedure including the risks, benefits and alternatives for the proposed anesthesia with the patient or authorized representative who has indicated his/her understanding and acceptance.   Dental advisory given  Plan Discussed with: CRNA  Anesthesia Plan Comments:         Anesthesia Quick Evaluation

## 2016-11-26 NOTE — Progress Notes (Signed)
Pt doing well, pain controlled.  No CP/SOB.  AF, VSS UOP adequate Gen - NAD Abd - soft, NT/ND.  Inc c/d/i Ext - NT   A/P:  POD0 s/p TAH Add toradol Continue PCA prn Routine postop care

## 2016-11-26 NOTE — Op Note (Signed)
11/26/2016  9:07 AM  PATIENT:  Paula [name]  46 y.o. female  PRE-OPERATIVE DIAGNOSIS:  MENORRHAGE AND FIBROIDS  POST-OPERATIVE DIAGNOSIS:  MENORRHAGE AND FIBROIDS  PROCEDURE:  Procedure(s): HYSTERECTOMY ABDOMINAL  SURGEON:  Surgeon(s) and Role:    * Marylynn Pearson, MD - Primary    PHYSICIAN ASSISTANT: megan Morris, DO   ANESTHESIA:   general  EBL:  Total I/O In: 1000 [I.V.:1000] Out: 800 [Urine:450; Blood:350]  BLOOD ADMINISTERED:none  DRAINS: none   LOCAL MEDICATIONS USED:  NONE  SPECIMEN:  Source of Specimen:  uterus/cervix  DISPOSITION OF SPECIMEN:  PATHOLOGY  COUNTS:  YES  DICTATION: .Other Dictation: Dictation Number pending  PLAN OF CARE: Admit to inpatient   PATIENT DISPOSITION:  PACU - hemodynamically stable.   Delay start of Pharmacological VTE agent (>24hrs) due to surgical blood loss or risk of bleeding: no

## 2016-11-26 NOTE — Anesthesia Procedure Notes (Signed)
Procedure Name: Intubation Date/Time: 11/26/2016 7:38 AM Performed by: Flossie Dibble Pre-anesthesia Checklist: Patient identified, Emergency Drugs available, Suction available, Patient being monitored and Timeout performed Patient Re-evaluated:Patient Re-evaluated prior to induction Oxygen Delivery Method: Circle system utilized Preoxygenation: Pre-oxygenation with 100% oxygen Induction Type: IV induction Ventilation: Mask ventilation with difficulty and Oral airway inserted - appropriate to patient size Laryngoscope Size: Mac and 3 Grade View: Grade IV Tube type: Oral Tube size: 7.0 mm Number of attempts: 2 (View with MAC by CRNA and MDA, only epiglottis seen. Successful intubation via Bougie by MDA, fisrt attempt.) Airway Equipment and Method: Stylet and Bougie stylet Placement Confirmation: breath sounds checked- equal and bilateral and positive ETCO2 Secured at: 21 cm Tube secured with: Tape Dental Injury: Teeth and Oropharynx as per pre-operative assessment  Difficulty Due To: Difficult Airway- due to large tongue and Difficult Airway- due to anterior larynx

## 2016-11-26 NOTE — Transfer of Care (Signed)
Immediate Anesthesia Transfer of Care Note  Patient: Paula [name]  Procedure(s) Performed: Procedure(s): HYSTERECTOMY ABDOMINAL  Patient Location: PACU  Anesthesia Type:General  Level of Consciousness: awake, alert  and oriented  Airway & Oxygen Therapy: Patient Spontanous Breathing and Patient connected to nasal cannula oxygen  Post-op Assessment: Report given to RN and Post -op Vital signs reviewed and stable  Post vital signs: Reviewed and stable  Last Vitals:  Vitals:   11/26/16 0554  BP: 119/75  Resp: 16  Temp: 36.5 C  SpO2: 100%    Last Pain:  Vitals:   11/26/16 0554  TempSrc: Oral      Patients Stated Pain Goal: 4 (46/80/32 1224)  Complications: No apparent anesthesia complications

## 2016-11-26 NOTE — Anesthesia Postprocedure Evaluation (Signed)
Anesthesia Post Note  Patient: Paula [name]  Procedure(s) Performed: Procedure(s): HYSTERECTOMY ABDOMINAL     Patient location during evaluation: PACU Anesthesia Type: General Level of consciousness: awake and alert Pain management: pain level controlled Vital Signs Assessment: post-procedure vital signs reviewed and stable Respiratory status: spontaneous breathing, nonlabored ventilation, respiratory function stable and patient connected to nasal cannula oxygen Cardiovascular status: blood pressure returned to baseline and stable Postop Assessment: no apparent nausea or vomiting Anesthetic complications: no    Last Vitals:  Vitals:   11/26/16 1308 11/26/16 1400  BP: 106/63 (!) 117/58  Pulse: 74   Resp: 13 19  Temp: 37.1 C   SpO2: 100% 100%    Last Pain:  Vitals:   11/26/16 1400  TempSrc:   PainSc: 7    Pain Goal: Patients Stated Pain Goal: 2 (11/26/16 1400)               Montez Hageman

## 2016-11-27 ENCOUNTER — Encounter (HOSPITAL_COMMUNITY): Payer: Self-pay | Admitting: Obstetrics and Gynecology

## 2016-11-27 LAB — CBC
HCT: 26.7 % — ABNORMAL LOW (ref 36.0–46.0)
Hemoglobin: 8.2 g/dL — ABNORMAL LOW (ref 12.0–15.0)
MCH: 22.5 pg — ABNORMAL LOW (ref 26.0–34.0)
MCHC: 30.7 g/dL (ref 30.0–36.0)
MCV: 73.2 fL — ABNORMAL LOW (ref 78.0–100.0)
Platelets: 258 K/uL (ref 150–400)
RBC: 3.65 MIL/uL — ABNORMAL LOW (ref 3.87–5.11)
RDW: 17.9 % — ABNORMAL HIGH (ref 11.5–15.5)
WBC: 9 K/uL (ref 4.0–10.5)

## 2016-11-27 MED ORDER — DOCUSATE SODIUM 100 MG PO CAPS: 100.0000 mg | ORAL_CAPSULE | Freq: Two times a day (BID) | ORAL | 0 refills | Status: DC

## 2016-11-27 MED ORDER — IBUPROFEN 600 MG PO TABS
600.0000 mg | ORAL_TABLET | Freq: Four times a day (QID) | ORAL | 1 refills | Status: DC | PRN
Start: 2016-11-27 — End: 2017-01-28

## 2016-11-27 MED ORDER — DOCUSATE SODIUM 100 MG PO CAPS
100.0000 mg | ORAL_CAPSULE | Freq: Two times a day (BID) | ORAL | Status: DC
  Administered 2016-11-27: 100 mg via ORAL
  Filled 2016-11-27: qty 1

## 2016-11-27 MED ORDER — OXYCODONE-ACETAMINOPHEN 5-325 MG PO TABS: 1.0000 | ORAL_TABLET | ORAL | 0 refills | Status: DC | PRN

## 2016-11-27 MED ORDER — FERROUS SULFATE 325 (65 FE) MG PO TABS
325.0000 mg | ORAL_TABLET | Freq: Every day | ORAL | Status: DC
  Administered 2016-11-27: 325 mg via ORAL
  Filled 2016-11-27: qty 1

## 2016-11-27 NOTE — Progress Notes (Signed)
Pt requests early discharge.  Tolerating regular diet and pain controlled with PO meds.  + flatus.  Ambulating  AF, VSS Gen - NAD Abd - soft, NT/ND + BS Inc - c/d/i Ext - NT, no edema  Results for orders placed or performed during the hospital encounter of 11/26/16 (from the past 48 hour(s))  Pregnancy, urine     Status: None   Collection Time: 11/26/16  6:00 AM  Result Value Ref Range   Preg Test, Ur NEGATIVE NEGATIVE    Comment:        THE SENSITIVITY OF THIS METHODOLOGY IS >20 mIU/mL.   CBC     Status: Abnormal   Collection Time: 11/27/16  5:55 AM  Result Value Ref Range   WBC 9.0 4.0 - 10.5 K/uL   RBC 3.65 (L) 3.87 - 5.11 MIL/uL   Hemoglobin 8.2 (L) 12.0 - 15.0 g/dL   HCT 26.7 (L) 36.0 - 46.0 %   MCV 73.2 (L) 78.0 - 100.0 fL   MCH 22.5 (L) 26.0 - 34.0 pg   MCHC 30.7 30.0 - 36.0 g/dL   RDW 17.9 (H) 11.5 - 15.5 %   Platelets 258 150 - 400 K/uL    A/P:  Plan discharge rx percocet, ibuprofen, colace FU office 2 wks

## 2016-11-27 NOTE — Progress Notes (Signed)
Discharge teaching complete. Pt understood all information and didn't have any questions.

## 2016-11-27 NOTE — Op Note (Signed)
Paula [name], Paula [name]               ACCOUNT NO.:  [id number]  MEDICAL RECORD NO.:  [id number]  LOCATION:                                 FACILITY:  PHYSICIAN:  Marylynn Pearson, MD         DATE OF BIRTH:  DATE OF PROCEDURE: DATE OF DISCHARGE:                              OPERATIVE REPORT   PREOPERATIVE DIAGNOSES: 1. Fibroids. 2. Menorrhagia.  POSTOPERATIVE DIAGNOSES: 1. Fibroids. 2. Menorrhagia.  PROCEDURE:  Total abdominal hysterectomy.  SURGEON:  Marylynn Pearson, MD.  ASSISTANT:  Megan Morris, DO  ESTIMATED BLOOD LOSS:  350 mL.  URINE OUTPUT:  Clear.  SPECIMENS:  Uterus and cervix.  COMPLICATIONS:  None.  CONDITION:  Stable to recovery room.  DESCRIPTION OF PROCEDURE:  The patient was taken to the operating room where general anesthesia was obtained.  She was examined and noted to have a 16-week size uterus.  Decision was made to proceed with total abdominal hysterectomy.  She was prepped and draped in sterile fashion and a Foley catheter was inserted sterilely.  Pfannenstiel incision was made with a scalpel and extended to the level of the fascia.  The fascia was incised in the midline and extended laterally.  Peritoneum was identified, entered sharply.  This was extended.  Uterus was palpated and delivered through the abdominal incision, grasped with thyroid tenaculum and Kelly clamps.  Round ligament was grasped and suture ligated bilaterally.  It was transected.  Incision was extended down the level of the broad ligament to the bladder flap and the vesicouterine peritoneum was dissected off the lower uterine segment and cervix using sharp and blunt dissection.  Fallopian tube and uteroovarian ligament were then doubly clamped, cut, and suture ligated.  Excellent hemostasis was noted.  This was repeated on the opposite side.  Bilateral uterine arteries were clamped, cut, and suture ligated.  Hemostasis was noted. Cardinal ligaments and uterosacral ligaments  were clamped, cut, and suture ligated bilaterally.  The uterus and cervix were then amputated from the vagina and passed off to be sent to pathology.  The vagina was then reapproximated using a series of figure-of-eight sutures and then the pelvis was irrigated.  All pedicles were inspected and found to be hemostatic.  Vaginal cuff was hemostatic. All instruments and laps were then removed from the abdomen and pelvis. The peritoneum was closed with Monocryl.  The fascia was closed with PDS and the skin was closed with Vicryl.  Skin glue was placed and a bandage was placed over the incision.  The patient was extubated and taken to the recovery room in stable condition.     Marylynn Pearson, MD   ______________________________ Marylynn Pearson, MD    GA/MEDQ  D:  11/26/2016  T:  11/27/2016  Job:  [id number]

## 2016-11-27 NOTE — Anesthesia Postprocedure Evaluation (Signed)
Anesthesia Post Note  Patient: Paula [name]  Procedure(s) Performed: Procedure(s): HYSTERECTOMY ABDOMINAL     Patient location during evaluation: [facility] Anesthesia Type: General Level of consciousness: awake and alert Pain management: satisfactory to patient Vital Signs Assessment: post-procedure vital signs reviewed and stable Respiratory status: spontaneous breathing and respiratory function stable Cardiovascular status: stable Postop Assessment: adequate PO intake Anesthetic complications: no    Last Vitals:  Vitals:   11/27/16 0332 11/27/16 0733  BP: 120/66 110/64  Pulse: 65 (!) 58  Resp: 15 16  Temp: 37.2 C 37.1 C  SpO2: 99% 100%    Last Pain:  Vitals:   11/27/16 0800  TempSrc:   PainSc: 0-No pain   Pain Goal: Patients Stated Pain Goal: 2 (11/27/16 0800)               Katherina Mires

## 2016-11-27 NOTE — Addendum Note (Signed)
Addendum  created 11/27/16 4235 by Flossie Dibble, CRNA   Sign clinical note

## 2016-12-02 NOTE — Discharge Summary (Signed)
Physician Discharge Summary  Patient ID: Paula [name] [name]: [id number] DOB/AGE: [birth year] 44 y.o.  Admit date: 11/26/2016 Discharge date: 12/02/2016  Admission Diagnoses:  Discharge Diagnoses:  Active Problems:   Fibroids   Discharged Condition: stable  Hospital Course: Pt admitted for postop care.  Vital signs and labs remained normal.  Pain well controlled and she requested discharge on POD1.  She was ambulating and voiding without problems.  Tolerating a regular diet  Consults: None  Significant Diagnostic Studies: labs: cbc  Treatments: surgery: TAH  Discharge Exam: Blood pressure 110/64, pulse (!) 58, temperature 98.7 F (37.1 C), temperature source Oral, resp. rate 16, height 5\' 2"  (1.575 m), weight 143 lb (64.9 kg), last menstrual period 11/26/2016, SpO2 100 %.   Disposition: 01-Home or Self Care  Discharge Instructions    Call MD for:  difficulty breathing, headache or visual disturbances    Complete by:  As directed    Call MD for:  persistant nausea and vomiting    Complete by:  As directed    Call MD for:  redness, tenderness, or signs of infection (pain, swelling, redness, odor or green/yellow discharge around incision site)    Complete by:  As directed    Call MD for:  severe uncontrolled pain    Complete by:  As directed    Call MD for:  temperature >100.4    Complete by:  As directed    Diet - low sodium heart healthy    Complete by:  As directed    Increase activity slowly    Complete by:  As directed      Allergies as of 11/27/2016   No Known Allergies     Medication List    TAKE these medications   albuterol 108 (90 Base) MCG/ACT inhaler Commonly known as:  PROVENTIL HFA;VENTOLIN HFA Inhale 2 puffs into the lungs every 6 (six) hours as needed for wheezing or shortness of breath.   docusate sodium 100 MG capsule Commonly known as:  COLACE Take 1 capsule (100 mg total) by mouth 2 (two) times daily.   fluticasone 50 MCG/ACT nasal  spray Commonly known as:  FLONASE Place 2 sprays into both nostrils daily. What changed:  when to take this  reasons to take this   ibuprofen 600 MG tablet Commonly known as:  MOTRIN IB Take 1 tablet (600 mg total) by mouth every 6 (six) hours as needed.   oxyCODONE-acetaminophen 5-325 MG tablet Commonly known as:  PERCOCET/ROXICET Take 1-2 tablets by mouth every 4 (four) hours as needed for severe pain (moderate to severe pain (when tolerating fluids)).            Discharge Care Instructions        Start     Ordered   11/27/16 0000  docusate sodium (COLACE) 100 MG capsule  2 times daily     11/27/16 0827   11/27/16 0000  oxyCODONE-acetaminophen (PERCOCET/ROXICET) 5-325 MG tablet  Every 4 hours PRN     11/27/16 0827   11/27/16 0000  ibuprofen (ADVIL,MOTRIN) 600 MG tablet  Every 6 hours PRN     11/27/16 0827   11/27/16 0000  Increase activity slowly     11/27/16 0827   11/27/16 0000  Diet - low sodium heart healthy     11/27/16 0827   11/27/16 0000  Call MD for:  temperature >100.4     11/27/16 0827   11/27/16 0000  Call MD for:  persistant nausea and vomiting  11/27/16 0827   11/27/16 0000  Call MD for:  severe uncontrolled pain     11/27/16 0827   11/27/16 0000  Call MD for:  redness, tenderness, or signs of infection (pain, swelling, redness, odor or green/yellow discharge around incision site)     11/27/16 0827   11/27/16 0000  Call MD for:  difficulty breathing, headache or visual disturbances     11/27/16 0827     Follow-up Information    Marylynn Pearson, MD. Schedule an appointment as soon as possible for a visit in 2 week(s).   Specialty:  Obstetrics and Gynecology Contact information: [address], [place] [address] [phone]           Signed: Marylynn Pearson 12/02/2016, 7:36 AM

## 2017-01-26 ENCOUNTER — Ambulatory Visit: Payer: BC Managed Care – PPO | Admitting: Family Medicine

## 2017-01-28 ENCOUNTER — Other Ambulatory Visit: Payer: Self-pay

## 2017-01-28 ENCOUNTER — Encounter: Payer: Self-pay | Admitting: Family Medicine

## 2017-01-28 ENCOUNTER — Ambulatory Visit: Payer: BC Managed Care – PPO | Admitting: Family Medicine

## 2017-01-28 VITALS — BP 108/66 | HR 92 | Temp 98.9°F | Resp 17 | Ht 62.0 in | Wt 148.0 lb

## 2017-01-28 DIAGNOSIS — M546 Pain in thoracic spine: Secondary | ICD-10-CM | POA: Diagnosis not present

## 2017-01-28 DIAGNOSIS — Z23 Encounter for immunization: Secondary | ICD-10-CM

## 2017-01-28 DIAGNOSIS — G8929 Other chronic pain: Secondary | ICD-10-CM | POA: Diagnosis not present

## 2017-01-28 DIAGNOSIS — M26609 Unspecified temporomandibular joint disorder, unspecified side: Secondary | ICD-10-CM | POA: Diagnosis not present

## 2017-01-28 LAB — POCT URINALYSIS DIP (MANUAL ENTRY)
Bilirubin, UA: NEGATIVE
Blood, UA: NEGATIVE
Glucose, UA: NEGATIVE mg/dL
Ketones, POC UA: NEGATIVE mg/dL
LEUKOCYTES UA: NEGATIVE
NITRITE UA: NEGATIVE
PH UA: 5.5 (ref 5.0–8.0)
PROTEIN UA: NEGATIVE mg/dL
Spec Grav, UA: >=1.03 — AB (ref 1.010–1.025)
UROBILINOGEN UA: 0.2 U/dL

## 2017-01-28 MED ORDER — IBUPROFEN 600 MG PO TABS: 600.0000 mg | ORAL_TABLET | Freq: Four times a day (QID) | ORAL | 1 refills | Status: DC | PRN

## 2017-01-28 NOTE — Progress Notes (Signed)
Chief Complaint  Patient presents with  . Back Pain    x 2 months, tried otc ibuprofen for pain w/o relief.  Wants a referral for head and neck pain    HPI  Patient reports tha her right shoulder pain got better but now she has pain on both sides of the back that is worse with twisting and bending No pain in the bones It seems to just be in the muscles She denies falls or trauma She works in housekeeping at [employer] She takes some ibuprofen when needed She works from [employer] to [employer] and typically moves slower due to the pain  4 review of systems  Past Medical History:  Diagnosis Date  . Allergy   . Anemia     Current Outpatient Medications  Medication Sig Dispense Refill  . ibuprofen (ADVIL,MOTRIN) 600 MG tablet Take 1 tablet (600 mg total) every 6 (six) hours as needed by mouth. 30 tablet 1  . albuterol (PROVENTIL HFA;VENTOLIN HFA) 108 (90 Base) MCG/ACT inhaler Inhale 2 puffs into the lungs every 6 (six) hours as needed for wheezing or shortness of breath. (Patient not taking: Reported on 01/28/2017) 1 Inhaler 0  . docusate sodium (COLACE) 100 MG capsule Take 1 capsule (100 mg total) by mouth 2 (two) times daily. (Patient not taking: Reported on 01/28/2017) 10 capsule 0  . fluticasone (FLONASE) 50 MCG/ACT nasal spray Place 2 sprays into both nostrils daily. (Patient not taking: Reported on 01/28/2017) 16 g 6  . oxyCODONE-acetaminophen (PERCOCET/ROXICET) 5-325 MG tablet Take 1-2 tablets by mouth every 4 (four) hours as needed for severe pain (moderate to severe pain (when tolerating fluids)). (Patient not taking: Reported on 01/28/2017) 30 tablet 0   No current facility-administered medications for this visit.     Allergies: No Known Allergies  Past Surgical History:  Procedure Laterality Date  . HYSTERECTOMY ABDOMINAL  11/26/2016   Performed by Marylynn Pearson, MD at [facility] ORS  . NO PAST SURGERIES      Social History   Socioeconomic History  . Marital status: Single    Spouse  name: None  . Number of children: 3  . Years of education: 83  . Highest education level: None  Social Needs  . Financial resource strain: None  . Food insecurity - worry: None  . Food insecurity - inability: None  . Transportation needs - medical: None  . Transportation needs - non-medical: None  Occupational History  . Occupation: [employer]    Employer: [employer]  Tobacco Use  . Smoking status: Never Smoker  . Smokeless tobacco: Never Used  Substance and Sexual Activity  . Alcohol use: No  . Drug use: No  . Sexual activity: Not Currently  Other Topics Concern  . None  Social History Narrative  . None    Family History  Problem Relation Age of Onset  . Hypertension Mother   . Hypertension Sister      ROS Review of Systems See HPI Constitution: No fevers or chills No malaise No diaphoresis Skin: No rash or itching Eyes: no blurry vision, no double vision GU: no dysuria or hematuria Neuro: no dizziness or headaches    Objective: Vitals:   01/28/17 0935  BP: 108/66  Pulse: 92  Resp: 17  Temp: 98.9 F (37.2 C)  TempSrc: Oral  SpO2: 100%  Weight: 148 lb (67.1 kg)  Height: 5\' 2"  (1.575 m)    Physical Exam  Constitutional: She is oriented to person, place, and time. She appears well-developed and  well-nourished.  HENT:  Head: Normocephalic and atraumatic.  Eyes: Conjunctivae and EOM are normal.  Cardiovascular: Normal rate, regular rhythm and normal heart sounds.  Pulmonary/Chest: Effort normal and breath sounds normal. No stridor. No respiratory distress.  Musculoskeletal:       Back:  Neurological: She is alert and oriented to person, place, and time.  Skin: Skin is warm. Capillary refill takes less than 2 seconds.  Psychiatric: She has a normal mood and affect. Her behavior is normal. Judgment and thought content normal.    Assessment and Plan Arial was seen today for back pain.  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     [place]

## 2017-01-28 NOTE — Patient Instructions (Addendum)
IF you received an x-ray today, you will receive an invoice from [facility]. Please contact [facility] at [phone] with questions or concerns regarding your invoice.   IF you received labwork today, you will receive an invoice from [place]. Please contact [facility] at [phone] with questions or concerns regarding your invoice.   Our billing staff will not be able to assist you with questions regarding bills from these companies.  You will be contacted with the lab results as soon as they are available. The fastest way to get your results is to activate your My Chart account. Instructions are located on the last page of this paperwork. If you have not heard from [place] regarding the results in 2 weeks, please contact this office.    Temporomandibular Joint Syndrome Temporomandibular joint (TMJ) syndrome is a condition that affects the joints between your jaw and your skull. The TMJs are located near your ears and allow your jaw to open and close. These joints and the nearby muscles are involved in all movements of the jaw. People with TMJ syndrome have pain in the area of these joints and muscles. Chewing, biting, or other movements of the jaw can be difficult or painful. TMJ syndrome can be caused by various things. In many cases, the condition is mild and goes away within a few weeks. For some people, the condition can become a long-term problem. What are the causes? Possible causes of TMJ syndrome include:  Grinding your teeth or clenching your jaw. Some people do this when they are under stress.  Arthritis.  Injury to the jaw.  Head or neck injury.  Teeth or dentures that are not aligned well.  In some cases, the cause of TMJ syndrome may not be known. What are the signs or symptoms? The most common symptom is an aching pain on the side of the head in the area of the TMJ. Other symptoms may include:  Pain when moving your jaw, such as when chewing or  biting.  Being unable to open your jaw all the way.  Making a clicking sound when you open your mouth.  Headache.  Earache.  Neck or shoulder pain.  How is this diagnosed? Diagnosis can usually be made based on your symptoms, your medical history, and a physical exam. Your health care provider may check the range of motion of your jaw. Imaging tests, such as X-rays or an MRI, are sometimes done. You may need to see your dentist to determine if your teeth and jaw are lined up correctly. How is this treated? TMJ syndrome often goes away on its own. If treatment is needed, the options may include:  Eating soft foods and applying ice or heat.  Medicines to relieve pain or inflammation.  Medicines to relax the muscles.  A splint, bite plate, or mouthpiece to prevent teeth grinding or jaw clenching.  Relaxation techniques or counseling to help reduce stress.  Transcutaneous electrical nerve stimulation (TENS). This helps to relieve pain by applying an electrical current through the skin.  Acupuncture. This is sometimes helpful to relieve pain.  Jaw surgery. This is rarely needed.  Follow these instructions at home:  Take medicines only as directed by your health care provider.  Eat a soft diet if you are having trouble chewing.  Apply ice to the painful area. ? Put ice in a plastic bag. ? Place a towel between your skin and the bag. ? Leave the ice on for 20 minutes, 2-3 times  a day.  Apply a warm compress to the painful area as directed.  Massage your jaw area and perform any jaw stretching exercises as recommended by your health care provider.  If you were given a mouthpiece or bite plate, wear it as directed.  Avoid foods that require a lot of chewing. Do not chew gum.  Keep all follow-up visits as directed by your health care provider. This is important. Contact a health care provider if:  You are having trouble eating.  You have new or worsening symptoms. Get  help right away if:  Your jaw locks open or closed. This information is not intended to replace advice given to you by your health care provider. Make sure you discuss any questions you have with your health care provider. Document Released: 11/21/2000 Document Revised: 10/27/2015 Document Reviewed: 10/01/2013 Elsevier Interactive Patient Education  [employer].

## 2017-02-04 ENCOUNTER — Ambulatory Visit: Payer: Self-pay

## 2017-02-04 NOTE — Telephone Encounter (Signed)
Phone call to patient. Relayed message to patient from Dr. Nolon Rod. She states that she feels like something is wrong, like something is in her throat. She would like the xray done to confirm that there is no damage from putting the tube in her throat before she proceeds to physical therapy.   Provider, please advise.

## 2017-02-04 NOTE — Telephone Encounter (Signed)
Please notify the patient that airway difficulty is not related to the bones and muscles. This should not affect her physical therapy.  After surgery it can take 2-3 months for the throat to fully heal from the breathing tube.

## 2017-02-04 NOTE — Telephone Encounter (Signed)
Phone call from pt.  Reported she saw Dr. Nolon Rod on 11/19, and was to be referred to Physical Therapy.  Stated "I want to make Dr. Nolon Rod aware that after surgery in September, I c/o of my throat hurting, and one of the nurses told me that they had difficulty putting the tube in my throat."  Stated she wanted to ask Dr. Nolon Rod if an xray of her neck could be done, before going to PT, because she feels like there is "something uncomfortable in the area under the jaw and into the throat."  Stated she forgot to tell Dr. Nolon Rod about the problem they had putting the tube in her throat with last surgery.   Also stated she is willing to go to PT, but questioned if an xray needed to be done 1st.  Advised will send message to Dr. Nolon Rod with above information/ question.  Pt. Agreed.

## 2017-05-11 ENCOUNTER — Other Ambulatory Visit: Payer: Self-pay

## 2017-05-11 ENCOUNTER — Encounter: Payer: Self-pay | Admitting: Family Medicine

## 2017-05-11 ENCOUNTER — Ambulatory Visit: Payer: BC Managed Care – PPO | Admitting: Family Medicine

## 2017-05-11 VITALS — BP 104/82 | HR 83 | Temp 98.7°F | Resp 16 | Ht 62.0 in | Wt 148.4 lb

## 2017-05-11 DIAGNOSIS — R0989 Other specified symptoms and signs involving the circulatory and respiratory systems: Secondary | ICD-10-CM

## 2017-05-11 DIAGNOSIS — F458 Other somatoform disorders: Secondary | ICD-10-CM

## 2017-05-11 DIAGNOSIS — R070 Pain in throat: Secondary | ICD-10-CM

## 2017-05-11 MED ORDER — OMEPRAZOLE 20 MG PO CPDR
20.0000 mg | DELAYED_RELEASE_CAPSULE | Freq: Every day | ORAL | 3 refills | Status: DC
Start: 2017-05-11 — End: 2017-12-13

## 2017-05-11 MED ORDER — CETIRIZINE HCL 10 MG PO TABS: 10.0000 mg | ORAL_TABLET | Freq: Every day | ORAL | 3 refills | Status: DC

## 2017-05-11 NOTE — Patient Instructions (Addendum)
   IF you received an x-ray today, you will receive an invoice from [facility]. Please contact [facility] at [phone] with questions or concerns regarding your invoice.   IF you received labwork today, you will receive an invoice from [facility]. Please contact [facility] at [phone] with questions or concerns regarding your invoice.   Our billing staff will not be able to assist you with questions regarding bills from these companies.  You will be contacted with the lab results as soon as they are available. The fastest way to get your results is to activate your My Chart account. Instructions are located on the last page of this paperwork. If you have not heard from us regarding the results in 2 weeks, please contact this office.     Globus Pharyngeus Globus pharyngeus is a condition that makes it feel like you have a lump in your throat. It may also feel like you have something stuck in the front of your throat. This feeling may come and go. It is not painful, and it does not make it harder to swallow food or liquid. Globus pharyngeus does not cause changes that a health care provider can see during a physical exam. This condition usually goes away without treatment. What are the causes? Often, no cause can be found. The most common cause of globus pharyngeus is a condition that causes stomach juices to flow back up into the throat (gastroesophageal reflux). Other possible causes include:  Overstimulation of nerves that control swallowing.  Irritation of nerves that control swallowing (neuralgia).  An enlarged gland in the lower neck (thyroid gland).  Growth of tonsil tissue at the base of the tongue (lingual tonsil).  Anxiety.  Depression.  What are the signs or symptoms? The main symptom of this condition is a feeling of a lump in your throat. This feeling usually comes and goes. How is this diagnosed? This condition may be diagnosed after other  conditions have been ruled out. You may have tests, such as:  A swallow study.  Ear, nose, and throat evaluation.  An exam of your throat using a thin, flexible tube with a light and camera on the end (endoscopy).  How is this treated? This condition may go away on its own, without treatment. In some cases, antidepressant medicines may be helpful. Follow these instructions at home:  Follow instructions from your health care provider about eating or drinking restrictions.  Take over-the-counter and prescription medicines only as told by your health care provider.  Keep all follow-up visits as told by your health care provider. This is important.  Follow instructions from your health care provider about home care for gastroesophageal reflux. Your health care provider may recommend that you: ? Do not eat or drink anything that causes heartburn. ? Do not eat heavy meals close to bedtime. ? Do not drink caffeine. ? Do not drink alcohol. ? Raise the head of your bed. ? Sleep on your left side. Contact a health care provider if:  Your symptoms get worse.  You have throat pain.  You have trouble swallowing.  Food or liquid comes back up into your mouth.  You lose weight without trying. Get help right away if:  You develop swelling in your throat. Summary  Globus pharyngeus is a condition that makes it feel like you have a lump in your throat.  This condition usually goes away without treatment. This information is not intended to replace advice given to you by your health care   provider. Make sure you discuss any questions you have with your health care provider. Document Released: 11/02/2015 Document Revised: 11/02/2015 Document Reviewed: 11/02/2015 Elsevier Interactive Patient Education  2018 [employer]  

## 2017-05-11 NOTE — Progress Notes (Signed)
Chief Complaint  Patient presents with  . throat issues    per pt during surgical procedure for fibroids told it was difficult to place tube down throat due to swelling, pt is concerned and feels like it is swollen sometimes.  Pt would like throat checked along with lung fxn.    HPI   Pt reports that since her surgery September 2018 she had pain after intubation She states that her neck feels swollen and sometimes she coughs up blood She has had difficulty with dysphagia since 2017 and previously saw ENT She states that she was told that her intubation in September 2018 was very difficulty and that the "tube could not pass".  Since that time she has pain but has difficulty swallowing food and drink. Her symptoms are worse at night when she lays back to sleep. She reports that she gets lots of mucus and can't clear it.  She is not taking any allergy medications or nasal sprays.    4 review of systems  Past Medical History:  Diagnosis Date  . Allergy   . Anemia     Current Outpatient Medications  Medication Sig Dispense Refill  . fluticasone (FLONASE) 50 MCG/ACT nasal spray Place 2 sprays into both nostrils daily. 16 g 6  . ibuprofen (ADVIL,MOTRIN) 600 MG tablet Take 1 tablet (600 mg total) every 6 (six) hours as needed by mouth. 30 tablet 1  . cetirizine (ZYRTEC) 10 MG tablet Take 1 tablet (10 mg total) by mouth at bedtime. 30 tablet 3  . omeprazole (PRILOSEC) 20 MG capsule Take 1 capsule (20 mg total) by mouth daily. Before breakfast 30 capsule 3   No current facility-administered medications for this visit.     Allergies: No Known Allergies  Past Surgical History:  Procedure Laterality Date  . ABDOMINAL HYSTERECTOMY  11/26/2016   Procedure: HYSTERECTOMY ABDOMINAL;  Surgeon: Marylynn Pearson, MD;  Location: [facility];  Service: Gynecology;;  . NO PAST SURGERIES      Social History   Socioeconomic History  . Marital status: Single    Spouse name: None  . Number of  children: 3  . Years of education: 98  . Highest education level: None  Social Needs  . Financial resource strain: None  . Food insecurity - worry: None  . Food insecurity - inability: None  . Transportation needs - medical: None  . Transportation needs - non-medical: None  Occupational History  . Occupation: [employer]    Employer: [employer]  Tobacco Use  . Smoking status: Never Smoker  . Smokeless tobacco: Never Used  Substance and Sexual Activity  . Alcohol use: No  . Drug use: No  . Sexual activity: Not Currently  Other Topics Concern  . None  Social History Narrative  . None    Family History  Problem Relation Age of Onset  . Hypertension Mother   . Hypertension Sister      ROS Review of Systems See HPI Constitution: No fevers or chills No malaise No diaphoresis Skin: No rash or itching Eyes: no blurry vision, no double vision GU: no dysuria or hematuria Neuro: no dizziness or headaches all others reviewed and negative   Objective: Vitals:   05/11/17 0857  BP: 104/82  Pulse: 83  Resp: 16  Temp: 98.7 F (37.1 C)  TempSrc: Oral  SpO2: 100%  Weight: 148 lb 6.4 oz (67.3 kg)  Height: 5\' 2"  (1.575 m)    Physical Exam   General: alert, oriented, in NAD Head: normocephalic,  atraumatic, no sinus tenderness Eyes: EOM intact, no scleral icterus or conjunctival injection Ears: TM clear bilaterally Nose: mucosa nonerythematous, nonedematous Throat: no pharyngeal exudate or erythema Lymph: no posterior auricular, submental or cervical lymph adenopathy Heart: normal rate, normal sinus rhythm, no murmurs Lungs: clear to auscultation bilaterally, no wheezing     CLINICAL DATA:  Neck swelling.  Dysphagia for the past 2 weeks.  EXAM: THYROID ULTRASOUND  TECHNIQUE: Ultrasound examination of the thyroid gland and adjacent soft tissues was performed.  COMPARISON:  None.  FINDINGS: There is relative homogeneity of the thyroid  parenchymal echotexture.  Right thyroid lobe  Measurements: Normal in size measuring 4.4 x 1.2 x 1.4 cm.  No discrete nodule or mass identified within the right lobe of the thyroid.  Left thyroid lobe  Measurements: Normal in size measuring 4.4 x 1.1 x 1.5 cm.  No discrete nodule or mass identified within the left lobe of the thyroid.  Isthmus  Thickness: Normal in size measuring 0.3 cm in diameter.  No discrete nodule or mass identified within the thyroid isthmus.  Lymphadenopathy  None visualized.  IMPRESSION: Normal thyroid ultrasound. Specifically, no evidence of thyromegaly or discrete thyroid nodule / mass.   Electronically Signed   By: Sandi Mariscal M.D.   On: 06/24/2015 08:29  CLINICAL DATA:  Bright red blood in sputum when coughing, patient reports chest pressure and tightness.  EXAM: CHEST  2 VIEW  COMPARISON:  PA and lateral chest x-ray of July 09, 2012  FINDINGS: The heart size and mediastinal contours are within normal limits. Both lungs are clear. The visualized skeletal structures are unremarkable.  IMPRESSION: No active cardiopulmonary disease.   Electronically Signed   By: David  Martinique M.D.   On: 06/22/2015 11:51   Assessment and Plan Pinkie was seen today for throat issues.  Diagnoses and all orders for this visit:  Globus sensation -     Ambulatory referral to ENT  Throat pain -     Ambulatory referral to ENT  Other orders -     cetirizine (ZYRTEC) 10 MG tablet; Take 1 tablet (10 mg total) by mouth at bedtime. -     omeprazole (PRILOSEC) 20 MG capsule; Take 1 capsule (20 mg total) by mouth daily. Before breakfast   Given that pt has been worked up in 2017 for the same thing and now has the same complaint will send back to ENT but while she is waiting will start empiric treatment for globus sensation with omeprazole daily She should also take zyrtec every evening at bedtime to help with postnasal drip which  can be aggravating her symptoms Pt was educated on how to take medications and the importance of compliance She does not have dysphagia at this time This could all be related to allergic rhinitis and reflux  A total of 25 minutes were spent face-to-face with the patient during this encounter and over half of that time was spent on counseling and coordination of care.   [place]

## 2017-06-14 ENCOUNTER — Other Ambulatory Visit: Payer: Self-pay

## 2017-06-14 ENCOUNTER — Encounter: Payer: Self-pay | Admitting: Emergency Medicine

## 2017-06-14 ENCOUNTER — Ambulatory Visit: Payer: BC Managed Care – PPO | Admitting: Emergency Medicine

## 2017-06-14 VITALS — BP 124/79 | HR 72 | Temp 98.6°F | Resp 16 | Ht 62.0 in | Wt 152.6 lb

## 2017-06-14 DIAGNOSIS — R51 Headache: Secondary | ICD-10-CM | POA: Diagnosis not present

## 2017-06-14 DIAGNOSIS — R519 Headache, unspecified: Secondary | ICD-10-CM

## 2017-06-14 DIAGNOSIS — J0100 Acute maxillary sinusitis, unspecified: Secondary | ICD-10-CM | POA: Diagnosis not present

## 2017-06-14 DIAGNOSIS — R0981 Nasal congestion: Secondary | ICD-10-CM

## 2017-06-14 MED ORDER — HYDROCODONE-ACETAMINOPHEN 5-325 MG PO TABS
1.0000 | ORAL_TABLET | Freq: Four times a day (QID) | ORAL | 0 refills | Status: DC | PRN
Start: 2017-06-14 — End: 2017-12-13

## 2017-06-14 MED ORDER — AMOXICILLIN-POT CLAVULANATE 875-125 MG PO TABS: 1.0000 | ORAL_TABLET | Freq: Two times a day (BID) | ORAL | 0 refills | Status: AC

## 2017-06-14 MED ORDER — PSEUDOEPHEDRINE-GUAIFENESIN ER 60-600 MG PO TB12: 1.0000 | ORAL_TABLET | Freq: Two times a day (BID) | ORAL | 1 refills | Status: AC

## 2017-06-14 NOTE — Progress Notes (Signed)
Paula [name] 47 y.o.   Chief Complaint  Patient presents with  . Headache    x 1 1/2 weeks  . Sore Throat    and nasal congestion    HISTORY OF PRESENT ILLNESS: This is a 47 y.o. female complaining of sinus congestion x 1-2 weeks.  Sinus Problem  This is a new problem. The current episode started 1 to 4 weeks ago. The problem has been gradually worsening since onset. There has been no fever. Her pain is at a severity of 7/10. The pain is moderate. Associated symptoms include congestion, ear pain, headaches, sinus pressure and a sore throat. Pertinent negatives include no chills, coughing, hoarse voice, neck pain, shortness of breath, sneezing or swollen glands. Treatments tried: Zyrtec and Flonase.     Prior to Admission medications   Medication Sig Start Date End Date Taking? Authorizing Provider  cetirizine (ZYRTEC) 10 MG tablet Take 1 tablet (10 mg total) by mouth at bedtime. 05/11/17  Yes Stallings, Zoe A, MD  fluticasone (FLONASE) 50 MCG/ACT nasal spray Place 2 sprays into both nostrils daily. 04/13/16  Yes Forrest Moron, MD  ibuprofen (ADVIL,MOTRIN) 600 MG tablet Take 1 tablet (600 mg total) every 6 (six) hours as needed by mouth. 01/28/17  Yes Stallings, Zoe A, MD  omeprazole (PRILOSEC) 20 MG capsule Take 1 capsule (20 mg total) by mouth daily. Before breakfast 05/11/17  Yes Forrest Moron, MD    No Known Allergies  Patient Active Problem List   Diagnosis Date Noted  . Fibroids 11/26/2016  . Amenorrhea 08/05/2012    Past Medical History:  Diagnosis Date  . Allergy   . Anemia     Past Surgical History:  Procedure Laterality Date  . ABDOMINAL HYSTERECTOMY  11/26/2016   Procedure: HYSTERECTOMY ABDOMINAL;  Surgeon: Marylynn Pearson, MD;  Location: [facility];  Service: Gynecology;;  . NO PAST SURGERIES      Social History   Socioeconomic History  . Marital status: Single    Spouse name: Not on file  . Number of children: 3  . Years of education: 99  . Highest  education level: Not on file  Occupational History  . Occupation: [employer]    Employer: [employer]  Social Needs  . Financial resource strain: Not on file  . Food insecurity:    Worry: Not on file    Inability: Not on file  . Transportation needs:    Medical: Not on file    Non-medical: Not on file  Tobacco Use  . Smoking status: Never Smoker  . Smokeless tobacco: Never Used  Substance and Sexual Activity  . Alcohol use: No  . Drug use: No  . Sexual activity: Not Currently  Lifestyle  . Physical activity:    Days per week: Not on file    Minutes per session: Not on file  . Stress: Not on file  Relationships  . Social connections:    Talks on phone: Not on file    Gets together: Not on file    Attends religious service: Not on file    Active member of club or organization: Not on file    Attends meetings of clubs or organizations: Not on file    Relationship status: Not on file  . Intimate partner violence:    Fear of current or ex partner: Not on file    Emotionally abused: Not on file    Physically abused: Not on file    Forced sexual activity: Not on file  Other Topics Concern  . Not on file  Social History Narrative  . Not on file    Family History  Problem Relation Age of Onset  . Hypertension Mother   . Hypertension Sister      Review of Systems  Constitutional: Negative.  Negative for chills, fever, malaise/fatigue and weight loss.  HENT: Positive for congestion, ear pain, sinus pressure, sinus pain and sore throat. Negative for ear discharge, hearing loss, hoarse voice, nosebleeds and sneezing.   Eyes: Negative.  Negative for discharge and redness.  Respiratory: Negative for cough, hemoptysis, sputum production, shortness of breath and wheezing.   Cardiovascular: Negative.  Negative for chest pain, palpitations and leg swelling.  Gastrointestinal: Negative.  Negative for abdominal pain, diarrhea, nausea and vomiting.  Genitourinary: Negative.  Negative for  dysuria and hematuria.  Musculoskeletal: Negative for myalgias and neck pain.  Skin: Negative.  Negative for rash.  Neurological: Positive for headaches. Negative for dizziness, speech change, focal weakness and weakness.  All other systems reviewed and are negative.  Vitals:   06/14/17 0808  BP: 124/79  Pulse: 72  Resp: 16  Temp: 98.6 F (37 C)  SpO2: 100%     Physical Exam  Constitutional: She is oriented to person, place, and time. She appears well-developed and well-nourished.  HENT:  Head: Normocephalic and atraumatic.  Right Ear: Tympanic membrane, external ear and ear canal normal.  Left Ear: Tympanic membrane, external ear and ear canal normal.  Nose: Mucosal edema present. Right sinus exhibits maxillary sinus tenderness. Left sinus exhibits maxillary sinus tenderness.  Mouth/Throat: Oropharynx is clear and moist.  Eyes: Pupils are equal, round, and reactive to light. Conjunctivae and EOM are normal.  Neck: Normal range of motion. Neck supple. No JVD present. No thyromegaly present.  Cardiovascular: Normal rate, regular rhythm and normal heart sounds.  Pulmonary/Chest: Effort normal and breath sounds normal. No respiratory distress.  Abdominal: Soft. She exhibits no distension. There is no tenderness.  Musculoskeletal: Normal range of motion. She exhibits no edema.  Lymphadenopathy:    She has no cervical adenopathy.  Neurological: She is alert and oriented to person, place, and time. No sensory deficit. She exhibits normal muscle tone.  Skin: Skin is warm and dry. Capillary refill takes less than 2 seconds. No rash noted.  Psychiatric: She has a normal mood and affect. Her behavior is normal.  Vitals reviewed.    ASSESSMENT & PLAN: Aylyn was seen today for headache and sore throat.  Diagnoses and all orders for this visit:  Sinus congestion -     pseudoephedrine-guaifenesin (MUCINEX D) 60-600 MG 12 hr tablet; Take 1 tablet by mouth every 12 (twelve) hours for 5  days.  Sinus headache -     HYDROcodone-acetaminophen (NORCO) 5-325 MG tablet; Take 1 tablet by mouth every 6 (six) hours as needed for moderate pain.  Acute non-recurrent maxillary sinusitis -     amoxicillin-clavulanate (AUGMENTIN) 875-125 MG tablet; Take 1 tablet by mouth 2 (two) times daily for 7 days.    Patient Instructions    Continue Zyrtec and Flonase.   IF you received an x-ray today, you will receive an invoice from [facility]. Please contact [facility] at [phone] with questions or concerns regarding your invoice.   IF you received labwork today, you will receive an invoice from [facility]. Please contact [facility] at [phone] with questions or concerns regarding your invoice.   Our billing staff will not be able to assist you with questions regarding bills from these  companies.  You will be contacted with the lab results as soon as they are available. The fastest way to get your results is to activate your My Chart account. Instructions are located on the last page of this paperwork. If you have not heard from [place] regarding the results in 2 weeks, please contact this office.     Sinus Headache A sinus headache happens when your sinuses become clogged or swollen. You may feel pain or pressure in your face, forehead, ears, or upper teeth. Sinus headaches can be mild or severe. Follow these instructions at home:  Take medicines only as told by your doctor.  If you were given an antibiotic medicine, finish all of it even if you start to feel better.  Use a nose spray if you feel stuffed up (congested).  If told, apply a warm, moist washcloth to your face to help lessen pain. Contact a doctor if:  You get headaches more than one time each week.  Light or sound bothers you.  You have a fever.  You feel sick to your stomach (nauseous) or you throw up (vomit).  Your headaches do not get better with treatment. Get help right away if:  You  have trouble seeing.  You suddenly have very bad pain in your face or head.  You start to twitch or shake (seizure).  You are confused.  You have a stiff neck. This information is not intended to replace advice given to you by your health care provider. Make sure you discuss any questions you have with your health care provider. Document Released: 06/28/2010 Document Revised: 10/23/2015 Document Reviewed: 02/22/2014 Elsevier Interactive Patient Education  2018 [employer].      Agustina Caroli, MD [facility]

## 2017-06-14 NOTE — Patient Instructions (Addendum)
  Continue Zyrtec and Flonase.   IF you received an x-ray today, you will receive an invoice from [facility]. Please contact [facility] at [phone] with questions or concerns regarding your invoice.   IF you received labwork today, you will receive an invoice from [facility]. Please contact [facility] at [phone] with questions or concerns regarding your invoice.   Our billing staff will not be able to assist you with questions regarding bills from these companies.  You will be contacted with the lab results as soon as they are available. The fastest way to get your results is to activate your My Chart account. Instructions are located on the last page of this paperwork. If you have not heard from [place] regarding the results in 2 weeks, please contact this office.     Sinus Headache A sinus headache happens when your sinuses become clogged or swollen. You may feel pain or pressure in your face, forehead, ears, or upper teeth. Sinus headaches can be mild or severe. Follow these instructions at home:  Take medicines only as told by your doctor.  If you were given an antibiotic medicine, finish all of it even if you start to feel better.  Use a nose spray if you feel stuffed up (congested).  If told, apply a warm, moist washcloth to your face to help lessen pain. Contact a doctor if:  You get headaches more than one time each week.  Light or sound bothers you.  You have a fever.  You feel sick to your stomach (nauseous) or you throw up (vomit).  Your headaches do not get better with treatment. Get help right away if:  You have trouble seeing.  You suddenly have very bad pain in your face or head.  You start to twitch or shake (seizure).  You are confused.  You have a stiff neck. This information is not intended to replace advice given to you by your health care provider. Make sure you discuss any questions you have with your health care  provider. Document Released: 06/28/2010 Document Revised: 10/23/2015 Document Reviewed: 02/22/2014 Elsevier Interactive Patient Education  [employer].

## 2017-10-05 ENCOUNTER — Other Ambulatory Visit: Payer: Self-pay

## 2017-10-05 ENCOUNTER — Ambulatory Visit (INDEPENDENT_AMBULATORY_CARE_PROVIDER_SITE_OTHER): Payer: BC Managed Care – PPO | Admitting: Family Medicine

## 2017-10-05 ENCOUNTER — Encounter: Payer: Self-pay | Admitting: Family Medicine

## 2017-10-05 VITALS — BP 112/72 | HR 76 | Temp 99.5°F | Resp 16 | Ht 62.5 in | Wt 148.8 lb

## 2017-10-05 DIAGNOSIS — M62838 Other muscle spasm: Secondary | ICD-10-CM

## 2017-10-05 DIAGNOSIS — M7918 Myalgia, other site: Secondary | ICD-10-CM

## 2017-10-05 MED ORDER — MELOXICAM 15 MG PO TABS: 15.0000 mg | ORAL_TABLET | Freq: Every day | ORAL | 1 refills | Status: DC

## 2017-10-05 MED ORDER — CYCLOBENZAPRINE HCL 10 MG PO TABS: 10.0000 mg | ORAL_TABLET | Freq: Every day | ORAL | 2 refills | Status: DC

## 2017-10-05 NOTE — Progress Notes (Signed)
By signing my name below, I, Schuyler Bain, attest that this documentation has been prepared under the direction and in the presence of Dr. Delman Cheadle. Electronically Signed: Baldwin Jamaica, Scribe 10/05/2017 at 12:25 PM.   Subjective:    Patient ID: Paula [name], female    DOB: Sep 23, 1970, 47 y.o.   [name]: [id number]  Chief Complaint  Patient presents with  . Back stiffness    entire back x 1 year, comes and goes   HPI Paula [name] is a 47 y.o. female who presents to Primary Care at [facility] complaining of neck stiffness which began two weeks ago, and back stiffness which she notes has been present for the last year.   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 She takes Ibuprofen, 1-2 times each month, for her stiffness. She is not taking any other medications, but she regularly stretches at the gym and walks on the treadmill three times each week up to 45 minutes. She denies any tingling, numbness, weakness in her arms, hands, or legs. She also notes that she has historic trouble sleeping when she does not walk during the day, denying pain as the cause of her trouble sleeping.    Patient Active Problem List   Diagnosis Date Noted  . Sinus headache 06/14/2017  . Acute non-recurrent maxillary sinusitis 06/14/2017  . Fibroids 11/26/2016  . Amenorrhea 08/05/2012   Past Medical History:  Diagnosis Date  . Allergy   . Anemia    Past Surgical History:  Procedure Laterality Date  . ABDOMINAL HYSTERECTOMY  11/26/2016   Procedure: HYSTERECTOMY ABDOMINAL;  Surgeon: Marylynn Pearson, MD;  Location: [facility];  Service: Gynecology;;  . NO PAST SURGERIES     No Known Allergies Prior to Admission medications   Medication Sig Start Date End Date Taking? Authorizing Provider  cetirizine (ZYRTEC) 10 MG tablet Take 1 tablet (10 mg total) by mouth at bedtime. 05/11/17  Yes Stallings, Zoe A, MD    fluticasone (FLONASE) 50 MCG/ACT nasal spray Place 2 sprays into both nostrils daily. 04/13/16  Yes Forrest Moron, MD  ibuprofen (ADVIL,MOTRIN) 600 MG tablet Take 1 tablet (600 mg total) every 6 (six) hours as needed by mouth. 01/28/17  Yes Stallings, Zoe A, MD  HYDROcodone-acetaminophen (NORCO) 5-325 MG tablet Take 1 tablet by mouth every 6 (six) hours as needed for moderate pain. Patient not taking: Reported on 10/05/2017 06/14/17   Horald Pollen, MD  omeprazole (PRILOSEC) 20 MG capsule Take 1 capsule (20 mg total) by mouth daily. Before breakfast Patient not taking: Reported on 10/05/2017 05/11/17   Forrest Moron, MD   Social History   Socioeconomic History  . Marital status: Single    Spouse name: Not on file  . Number of children: 3  . Years of education: 75  . Highest education level: Not on file  Occupational History  . Occupation: [employer]    Employer: [employer]  Social Needs  . Financial resource strain: Not on file  . Food insecurity:    Worry: Not on file    Inability: Not on file  . Transportation needs:    Medical: Not on file    Non-medical: Not on file  Tobacco Use  . Smoking status: Never Smoker  . Smokeless tobacco: Never Used  Substance and Sexual Activity  . Alcohol use: No  . Drug use: No  . Sexual activity: Not Currently  Lifestyle  . Physical activity:    Days per week: Not on file    Minutes per session: Not on file  . Stress: Not on file  Relationships  . Social connections:    Talks on phone: Not on file    Gets together: Not on file    Attends religious service: Not on file    Active member of club or organization: Not on file    Attends meetings of clubs or organizations: Not on file    Relationship status: Not on file  . Intimate partner violence:    Fear of current or ex partner: Not on file    Emotionally abused: Not on file    Physically abused: Not on file    Forced sexual activity: Not on file  Other Topics Concern  . Not on file   Social History Narrative  . Not on file    Review of Systems  Constitutional: Negative for activity change, diaphoresis, fatigue and fever.  HENT: Negative for rhinorrhea and sneezing.   Respiratory: Negative for cough, shortness of breath and wheezing.   Cardiovascular: Negative for chest pain and palpitations.  Musculoskeletal: Positive for back pain, myalgias, neck pain and neck stiffness.  Neurological: Negative for dizziness, facial asymmetry, light-headedness, numbness and headaches.  Psychiatric/Behavioral: Negative for agitation, behavioral problems, confusion, decreased concentration and dysphoric mood. The patient is not nervous/anxious and is not hyperactive.        Objective:   Physical Exam  Constitutional: She is oriented to person, place, and time. She appears well-developed and well-nourished. No distress.  HENT:  Head: Normocephalic and atraumatic.  Eyes: Pupils are equal, round, and reactive to light. Conjunctivae and EOM are normal. Right eye exhibits no discharge. Left eye exhibits no discharge. No scleral icterus.  Neck: Neck supple. No JVD present. No thyromegaly present.  Mild restriction of cervical ROM, worse on lateral ROM.   Cardiovascular: Normal rate, regular rhythm and normal heart sounds.  Pulmonary/Chest: Breath sounds normal. No respiratory distress.  Abdominal: Soft. Bowel sounds are normal. She exhibits no distension.  Musculoskeletal:  Pain over right medial aspect of her right scapula, pain over the right rhomboid area. Pain with forward flexion of her head which began two weeks ago. She denies tenderness over occipital groove. Mild tenderness to palpation over the upper spinous process.  No point tenderness over the thoracic and lumbar spine. Some positive muscle spasm throughout right paraspinal muscle. Full shoulder ROM bilaterally.  Upper extremity DTR Symmetric  Lymphadenopathy:    She has no cervical adenopathy.  Neurological: She is alert  and oriented to person, place, and time.  Skin: Skin is warm and dry. No rash noted. She is not diaphoretic. No erythema. No pallor.  Psychiatric: She has a normal mood and affect. Her behavior is normal. Judgment and thought content normal.    Vitals:   10/05/17 1118  BP: 112/72  Pulse: 76  Resp: 16  Temp: 99.5 F (37.5 C)  TempSrc: Oral  SpO2: 99%  Weight: 148 lb 12.8 oz (67.5 kg)  Height: 5' 2.5" (1.588 m)    Results for orders placed or performed in visit on 01/28/17  POCT urinalysis dipstick  Result Value Ref Range   Color, UA yellow yellow   Clarity, UA clear clear   Glucose, UA negative negative mg/dL   Bilirubin, UA negative negative   Ketones, POC UA negative negative mg/dL   Spec Grav, UA >=1.030 (A) 1.010 - 1.025   Blood, UA negative  negative   pH, UA 5.5 5.0 - 8.0   Protein Ur, POC negative negative mg/dL   Urobilinogen, UA 0.2 0.2 or 1.0 E.U./dL   Nitrite, UA Negative Negative   Leukocytes, UA Negative Negative        Assessment & Plan:   Needs follow up office visit for Meloxicam as renal function has been unchecked for two years. 1. Rhomboid myalgia   2. Neck muscle spasm     See AVS for detailed med and stretching, heat, exercise, pressure point intstructions  Meds ordered this encounter  Medications  . cyclobenzaprine (FLEXERIL) 10 MG tablet    Sig: Take 1 tablet (10 mg total) by mouth at bedtime.    Dispense:  30 tablet    Refill:  2  . meloxicam (MOBIC) 15 MG tablet    Sig: Take 1 tablet (15 mg total) by mouth daily.    Dispense:  30 tablet    Refill:  1  I personally performed the services described in this documentation, which was scribed in my presence. The recorded information has been reviewed and considered, and addended by me as needed.   Delman Cheadle, M.D.  Primary Care at [facility] [address] [place], [place] [address] [phone] phone [phone] fax  12/13/17 6:58 AM

## 2017-10-05 NOTE — Patient Instructions (Addendum)
Take the meloxicam every morning  Do not use any other otc pain medication other than tylenol/acetaminophen - so no aleve, ibuprofen, motrin, advil, etc.   30-60 min before bed, take a muscle relaxant cyclobenzaprine followed by 15 minutes of heat (try putting dry uncooked rice or beans into a sock or pillowcase then microwave it for 2-3 minutes.  After you have applied heat, Do 15 minutes of the stretches we reviewed in the office today.  After that, lay on the floor with a tennis ball or exercise ball or foam roller (look for these with the exercise equipment at [facility] or [employer]) positioned under the painful parts of your upper back and move your body around slowly to get point pressure against the spasm muscle so it will release.    Do this regimen EVERY night for 1-2 months until you are completely better and pain free.   If you are still having pain in 4 to 6 wks, come back to clinic for further eval. RTC immed if symptoms worsen or you develop any other concerning symptoms below.     IF you received an x-ray today, you will receive an invoice from [facility]. Please contact [facility] at [phone] with questions or concerns regarding your invoice.   IF you received labwork today, you will receive an invoice from [place]. Please contact [facility] at [phone] with questions or concerns regarding your invoice.   Our billing staff will not be able to assist you with questions regarding bills from these companies.  You will be contacted with the lab results as soon as they are available. The fastest way to get your results is to activate your My Chart account. Instructions are located on the last page of this paperwork. If you have not heard from [place] regarding the results in 2 weeks, please contact this office.     Chronic Back Pain When back pain lasts longer than 3 months, it is called chronic back pain.The cause of your back pain may not be known. Some common  causes include:  Wear and tear (degenerative disease) of the bones, ligaments, or disks in your back.  Inflammation and stiffness in your back (arthritis).  People who have chronic back pain often go through certain periods in which the pain is more intense (flare-ups). Many people can learn to manage the pain with home care. Follow these instructions at home: Pay attention to any changes in your symptoms. Take these actions to help with your pain: Activity  Avoid bending and activities that make the problem worse.  Do not sit or stand in one place for long periods of time.  Take brief periods of rest throughout the day. This will reduce your pain. Resting in a lying or standing position is usually better than sitting to rest.  When you are resting for longer periods, mix in some mild activity or stretching between periods of rest. This will help to prevent stiffness and pain.  Get regular exercise. Ask your health care provider what activities are safe for you.  Do not lift anything that is heavier than 10 lb (4.5 kg). Always use proper lifting technique, which includes: ? Bending your knees. ? Keeping the load close to your body. ? Avoiding twisting. Managing pain  If directed, apply ice to the painful area. Your health care provider may recommend applying ice during the first 24-48 hours after a flare-up begins. ? Put ice in a plastic bag. ? Place a towel between your skin and  the bag. ? Leave the ice on for 20 minutes, 2-3 times per day.  After icing, apply heat to the affected area as often as told by your health care provider. Use the heat source that your health care provider recommends, such as a moist heat pack or a heating pad. ? Place a towel between your skin and the heat source. ? Leave the heat on for 20-30 minutes. ? Remove the heat if your skin turns bright red. This is especially important if you are unable to feel pain, heat, or cold. You may have a greater risk  of getting burned.  Try soaking in a warm tub.  Take over-the-counter and prescription medicines only as told by your health care provider.  Keep all follow-up visits as told by your health care provider. This is important. Contact a health care provider if:  You have pain that is not relieved with rest or medicine. Get help right away if:  You have weakness or numbness in one or both of your legs or feet.  You have trouble controlling your bladder or your bowels.  You have nausea or vomiting.  You have pain in your abdomen.  You have shortness of breath or you faint. This information is not intended to replace advice given to you by your health care provider. Make sure you discuss any questions you have with your health care provider. Document Released: 04/05/2004 Document Revised: 07/07/2015 Document Reviewed: 08/16/2014 Elsevier Interactive Patient Education  2018 [employer].

## 2018-01-20 ENCOUNTER — Other Ambulatory Visit: Payer: Self-pay

## 2018-01-20 ENCOUNTER — Ambulatory Visit: Payer: BC Managed Care – PPO | Admitting: Family Medicine

## 2018-01-20 ENCOUNTER — Encounter: Payer: Self-pay | Admitting: Family Medicine

## 2018-01-20 VITALS — BP 121/82 | HR 77 | Temp 98.4°F | Ht 62.0 in | Wt 149.0 lb

## 2018-01-20 DIAGNOSIS — R040 Epistaxis: Secondary | ICD-10-CM | POA: Diagnosis not present

## 2018-01-20 DIAGNOSIS — J302 Other seasonal allergic rhinitis: Secondary | ICD-10-CM

## 2018-01-20 DIAGNOSIS — Z23 Encounter for immunization: Secondary | ICD-10-CM | POA: Diagnosis not present

## 2018-01-20 MED ORDER — MONTELUKAST SODIUM 10 MG PO TABS: 10.0000 mg | ORAL_TABLET | Freq: Every day | ORAL | 3 refills | Status: DC

## 2018-01-20 NOTE — Progress Notes (Signed)
11/11/20199:05 AM  Paula [name] 1970-05-23, 47 y.o. female [id number]  Chief Complaint  Patient presents with  . Epistaxis    works outside, having dry nose, nose bleeds for the past 4 days with muscus. Lots of blood come out when she blows her nose     HPI:   Patient is a 47 y.o. female who presents today for nasal congestion and bloody nose  For past 4 days having bloody noses only when she blows her nose Having nasal congestion, lots of thick yellow mucous No cough or SOB but feels a bit tight No fever or chills Doing honey,ginger,lemon tea Denies nose picking Has been taking mucinex In the past has used flonase but not currently Sneezing, having itchiness Has spring time allergies but normally not fall/winter  Fall Risk  01/20/2018 10/05/2017 06/14/2017 01/28/2017 04/06/2016  Falls in the past year? 0 No No No No     Depression screen [facility] 2/9 10/05/2017 06/14/2017 05/11/2017  Decreased Interest 0 0 0  Down, Depressed, Hopeless 0 0 0  PHQ - 2 Score 0 0 0    No Known Allergies  Prior to Admission medications   Medication Sig Start Date End Date Taking? Authorizing Provider  fluticasone (FLONASE) 50 MCG/ACT nasal spray Place 2 sprays into both nostrils daily. 04/13/16  Yes Forrest Moron, MD  ibuprofen (ADVIL,MOTRIN) 600 MG tablet Take 1 tablet (600 mg total) every 6 (six) hours as needed by mouth. 01/28/17  Yes Forrest Moron, MD    Past Medical History:  Diagnosis Date  . Allergy   . Anemia     Past Surgical History:  Procedure Laterality Date  . ABDOMINAL HYSTERECTOMY  11/26/2016   Procedure: HYSTERECTOMY ABDOMINAL;  Surgeon: Marylynn Pearson, MD;  Location: [facility];  Service: Gynecology;;  . NO PAST SURGERIES      Social History   Tobacco Use  . Smoking status: Never Smoker  . Smokeless tobacco: Never Used  Substance Use Topics  . Alcohol use: No    Family History  Problem Relation Age of Onset  . Hypertension Mother   . Hypertension Sister      ROS Per hpi  OBJECTIVE:  Blood pressure 121/82, pulse 77, temperature 98.4 F (36.9 C), temperature source Oral, height 5\' 2"  (1.575 m), weight 149 lb (67.6 kg), last menstrual period 11/26/2016, SpO2 97 %. Body mass index is 27.25 kg/m.   Physical Exam  Constitutional: She is oriented to person, place, and time. She appears well-developed and well-nourished.  HENT:  Head: Normocephalic and atraumatic.  Right Ear: Hearing, tympanic membrane, external ear and ear canal normal.  Left Ear: Hearing, tympanic membrane, external ear and ear canal normal.  Nose: Mucosal edema and rhinorrhea present. Epistaxis is observed. Right sinus exhibits no maxillary sinus tenderness and no frontal sinus tenderness. Left sinus exhibits no maxillary sinus tenderness and no frontal sinus tenderness.  Mouth/Throat: Oropharynx is clear and moist.  Eyes: Pupils are equal, round, and reactive to light. Conjunctivae and EOM are normal.  Neck: Neck supple.  Cardiovascular: Normal rate, regular rhythm and normal heart sounds. Exam reveals no gallop and no friction rub.  No murmur heard. Pulmonary/Chest: Effort normal and breath sounds normal. She has no wheezes. She has no rales.  Musculoskeletal: She exhibits no edema.  Lymphadenopathy:    She has no cervical adenopathy.  Neurological: She is alert and oriented to person, place, and time.  Skin: Skin is warm and dry.  Psychiatric: She has a normal mood  and affect.  Nursing note and vitals reviewed.   ASSESSMENT and PLAN  1. Seasonal allergies 2. Epistaxis Discussed supportive measures, start flonase, nasal saline and sudafed. Dc mucinex. new meds r/se/b and RTC precautions.   3. Need for prophylactic vaccination and inoculation against influenza - Flu Vaccine QUAD 36+ mos IM  Other orders - montelukast (SINGULAIR) 10 MG tablet; Take 1 tablet (10 mg total) by mouth at bedtime.    Return if symptoms worsen or fail to improve.    Rutherford Guys, MD Primary Care at [facility] [place], [place] [address] Ph.  [phone] Fax [phone]

## 2018-01-20 NOTE — Patient Instructions (Addendum)
  Stop mucinex and start sudafed    If you have lab work done today you will be contacted with your lab results within the next 2 weeks.  If you have not heard from [place] then please contact us. The fastest way to get your results is to register for My Chart.   IF you received an x-ray today, you will receive an invoice from [facility]. Please contact [facility] Radiology at [phone] with questions or concerns regarding your invoice.   IF you received labwork today, you will receive an invoice from [place]. Please contact [facility] at [phone] with questions or concerns regarding your invoice.   Our billing staff will not be able to assist you with questions regarding bills from these companies.  You will be contacted with the lab results as soon as they are available. The fastest way to get your results is to activate your My Chart account. Instructions are located on the last page of this paperwork. If you have not heard from [place] regarding the results in 2 weeks, please contact this office.

## 2018-02-15 ENCOUNTER — Ambulatory Visit: Payer: BC Managed Care – PPO | Admitting: Family Medicine

## 2018-08-05 ENCOUNTER — Ambulatory Visit: Payer: BC Managed Care – PPO | Admitting: Family Medicine

## 2018-08-18 ENCOUNTER — Other Ambulatory Visit: Payer: Self-pay

## 2018-08-18 ENCOUNTER — Ambulatory Visit: Payer: BC Managed Care – PPO | Admitting: Family Medicine

## 2018-08-18 ENCOUNTER — Encounter: Payer: Self-pay | Admitting: Family Medicine

## 2018-08-18 VITALS — BP 132/82 | HR 67 | Temp 98.7°F | Resp 17 | Ht 62.0 in | Wt 150.4 lb

## 2018-08-18 DIAGNOSIS — M62838 Other muscle spasm: Secondary | ICD-10-CM | POA: Diagnosis not present

## 2018-08-18 DIAGNOSIS — G44209 Tension-type headache, unspecified, not intractable: Secondary | ICD-10-CM

## 2018-08-18 DIAGNOSIS — R7303 Prediabetes: Secondary | ICD-10-CM | POA: Diagnosis not present

## 2018-08-18 DIAGNOSIS — M545 Low back pain, unspecified: Secondary | ICD-10-CM

## 2018-08-18 LAB — POCT URINALYSIS DIP (MANUAL ENTRY)
Bilirubin, UA: NEGATIVE
Glucose, UA: NEGATIVE mg/dL
Ketones, POC UA: NEGATIVE mg/dL
Leukocytes, UA: NEGATIVE
Nitrite, UA: NEGATIVE
Protein Ur, POC: NEGATIVE mg/dL
Spec Grav, UA: 1.02 (ref 1.010–1.025)
Urobilinogen, UA: 0.2 E.U./dL
pH, UA: 6 (ref 5.0–8.0)

## 2018-08-18 MED ORDER — CYCLOBENZAPRINE HCL 5 MG PO TABS: 5.0000 mg | ORAL_TABLET | Freq: Three times a day (TID) | ORAL | 1 refills | Status: DC | PRN

## 2018-08-18 NOTE — Patient Instructions (Addendum)
Apply Biofreeze based on the package instructions to the back and shoulders   If you have lab work done today you will be contacted with your lab results within the next 2 weeks.  If you have not heard from [place] then please contact us. The fastest way to get your results is to register for My Chart.   IF you received an x-ray today, you will receive an invoice from [facility]. Please contact [facility] at [phone] with questions or concerns regarding your invoice.   IF you received labwork today, you will receive an invoice from [facility]. Please contact [facility] at [phone] with questions or concerns regarding your invoice.   Our billing staff will not be able to assist you with questions regarding bills from these companies.  You will be contacted with the lab results as soon as they are available. The fastest way to get your results is to activate your My Chart account. Instructions are located on the last page of this paperwork. If you have not heard from [place] regarding the results in 2 weeks, please contact this office.     Tension Headache, Adult A tension headache is a feeling of pain, pressure, or aching in the head that is often felt over the front and sides of the head. The pain can be dull, or it can feel tight (constricting). There are two types of tension headache:  Episodic tension headache. This is when the headaches happen fewer than 15 days a month.  Chronic tension headache. This is when the headaches happen more than 15 days a month during a 66-month period. A tension headache can last from 30 minutes to several days. It is the most common kind of headache. Tension headaches are not normally associated with nausea or vomiting, and they do not get worse with physical activity. What are the causes? The exact cause of this condition is not known. Tension headaches are often triggered by stress, anxiety, or depression. Other triggers  include:  Alcohol.  Too much caffeine or caffeine withdrawal.  Respiratory infections, such as colds, flu, or sinus infections.  Dental problems or teeth clenching.  Tiredness (fatigue).  Holding your head and neck in the same position for a long period of time, such as while using a computer.  Smoking.  Arthritis of the neck. What are the signs or symptoms? Symptoms of this condition include:  A feeling of pressure or tightness around the head.  Dull, aching head pain.  Pain over the front and sides of the head.  Tenderness in the muscles of the head, neck, and shoulders. How is this diagnosed? This condition may be diagnosed based on your symptoms, your medical history, and a physical exam. If your symptoms are severe or unusual, you may have imaging tests, such as a CT scan or an MRI of your head. Your vision may also be checked. How is this treated? This condition may be treated with lifestyle changes and with medicines that help relieve symptoms. Follow these instructions at home: Managing pain  Take over-the-counter and prescription medicines only as told by your health care provider.  When you have a headache, lie down in a dark, quiet room.  If directed, apply ice to the head and neck: ? Put ice in a plastic bag. ? Place a towel between your skin and the bag. ? Leave the ice on for 20 minutes, 2-3 times a day.  If directed, apply heat to the back of your neck as often as  told by your health care provider. Use the heat source that your health care provider recommends, such as a moist heat pack or a heating pad. ? Place a towel between your skin and the heat source. ? Leave the heat on for 20-30 minutes. ? Remove the heat if your skin turns bright red. This is especially important if you are unable to feel pain, heat, or cold. You may have a greater risk of getting burned. Eating and drinking  Eat meals on a regular schedule.  Limit alcohol intake to no more  than 1 drink a day for nonpregnant women and 2 drinks a day for men. One drink equals 12 oz of beer, 5 oz of wine, or 1 oz of hard liquor.  Drink enough fluid to keep your urine pale yellow.  Decrease your caffeine intake, or stop using caffeine. Lifestyle  Get 7-9 hours of sleep each night, or get the amount of sleep recommended by your health care provider.  At bedtime, remove all electronic devices from your room. Electronic devices include computers, phones, and tablets.  Find ways to manage your stress. Some things that can help relieve stress include: ? Exercise. ? Deep breathing exercises. ? Yoga. ? Listening to music. ? Positive mental imagery.  Try to sit up straight and avoid tensing your muscles.  Do not use any products that contain nicotine or tobacco, such as cigarettes and e-cigarettes. If you need help quitting, ask your health care provider. General instructions   Keep all follow-up visits as told by your health care provider. This is important.  Avoid any headache triggers. Keep a headache journal to help find out what may trigger your headaches. For example, write down: ? What you eat and drink. ? How much sleep you get. ? Any change to your diet or medicines. Contact a health care provider if:  Your headache does not get better.  Your headache comes back.  You are sensitive to sounds, light, or smells because of a headache.  You have nausea or you vomit.  Your stomach hurts. Get help right away if:  You suddenly develop a very severe headache along with any of the following: ? A stiff neck. ? Nausea and vomiting. ? Confusion. ? Weakness. ? Double vision or loss of vision. ? Shortness of breath. ? Rash. ? Unusual sleepiness. ? Fever. ? Trouble speaking. ? Pain in your eyes or ears. ? Trouble walking or balancing. ? Feeling faint or passing out. Summary  A tension headache is a feeling of pain, pressure, or aching in the head that is often  felt over the front and sides of the head.  A tension headache can last from 30 minutes to several days. It is the most common kind of headache.  This condition may be diagnosed based on your symptoms, your medical history, and a physical exam.  This condition may be treated with lifestyle changes and with medicines that help relieve symptoms. This information is not intended to replace advice given to you by your health care provider. Make sure you discuss any questions you have with your health care provider. Document Released: 02/26/2005 Document Revised: 06/08/2016 Document Reviewed: 06/08/2016 Elsevier Interactive Patient Education  2019 [employer].

## 2018-08-19 LAB — LIPID PANEL
Chol/HDL Ratio: 2.9 ratio (ref 0.0–4.4)
Cholesterol, Total: 177 mg/dL (ref 100–199)
HDL: 62 mg/dL (ref >39)
LDL Calculated: 107 mg/dL — ABNORMAL HIGH (ref 0–99)
Triglycerides: 42 mg/dL (ref 0–149)
VLDL Cholesterol Cal: 8 mg/dL (ref 5–40)

## 2018-08-19 LAB — BASIC METABOLIC PANEL
BUN/Creatinine Ratio: 15 (ref 9–23)
BUN: 9 mg/dL (ref 6–24)
CO2: 23 mmol/L (ref 20–29)
Calcium: 9 mg/dL (ref 8.7–10.2)
Chloride: 104 mmol/L (ref 96–106)
Creatinine, Ser: 0.59 mg/dL (ref 0.57–1.00)
GFR calc Af Amer: 128 mL/min/{1.73_m2} (ref >59)
GFR calc non Af Amer: 111 mL/min/{1.73_m2} (ref >59)
Glucose: 73 mg/dL (ref 65–99)
Potassium: 4.8 mmol/L (ref 3.5–5.2)
Sodium: 140 mmol/L (ref 134–144)

## 2018-08-19 LAB — HEMOGLOBIN A1C
Est. average glucose Bld gHb Est-mCnc: 111 mg/dL
Hgb A1c MFr Bld: 5.5 % (ref 4.8–5.6)

## 2018-08-21 NOTE — Progress Notes (Signed)
Established Patient Office Visit  Subjective:  Patient ID: Paula [name], female    DOB: 03-30-1970  Age: 48 y.o. [name]: [id number]  CC:  Chief Complaint  Patient presents with  . back pain x 1 month    per pt experiencing pain in head/neck x 1 month also. Pain level 8/10 for all locations.  No otc meds taken for pain.  per pt no dysuria or fever    HPI Muscle tension   Michelina Mexicano presents for neck tightness, shoulder tightness, and back pain in upper back She reports that it causes her to have a headache She denies any vision changes, dizziness  She also has some midline low back pain  Screening for diabetes and lipid She was told that she has been told she is prediabetic and she is concerned about diabetes and cholesterol. She denies any polyuria, polydipsia, polyphagia    Past Medical History:  Diagnosis Date  . Allergy   . Anemia     Past Surgical History:  Procedure Laterality Date  . ABDOMINAL HYSTERECTOMY  11/26/2016   Procedure: HYSTERECTOMY ABDOMINAL;  Surgeon: Marylynn Pearson, MD;  Location: [place] [facility];  Service: Gynecology;;  . NO PAST SURGERIES      Family History  Problem Relation Age of Onset  . Hypertension Mother   . Hypertension Sister     Social History   Socioeconomic History  . Marital status: Single    Spouse name: Not on file  . Number of children: 3  . Years of education: 18  . Highest education level: Not on file  Occupational History  . Occupation: [employer]    Employer: [employer]  Social Needs  . Financial resource strain: Not on file  . Food insecurity    Worry: Not on file    Inability: Not on file  . Transportation needs    Medical: Not on file    Non-medical: Not on file  Tobacco Use  . Smoking status: Never Smoker  . Smokeless tobacco: Never Used  Substance and Sexual Activity  . Alcohol use: No  . Drug use: No  . Sexual activity: Not Currently  Lifestyle  . Physical activity    Days per week: Not on file    Minutes per  session: Not on file  . Stress: Not on file  Relationships  . Social Herbalist on phone: Not on file    Gets together: Not on file    Attends religious service: Not on file    Active member of club or organization: Not on file    Attends meetings of clubs or organizations: Not on file    Relationship status: Not on file  . Intimate partner violence    Fear of current or ex partner: Not on file    Emotionally abused: Not on file    Physically abused: Not on file    Forced sexual activity: Not on file  Other Topics Concern  . Not on file  Social History Narrative  . Not on file    Outpatient Medications Prior to Visit  Medication Sig Dispense Refill  . fluticasone (FLONASE) 50 MCG/ACT nasal spray Place 2 sprays into both nostrils daily. (Patient not taking: Reported on 08/18/2018) 16 g 6  . ibuprofen (ADVIL,MOTRIN) 600 MG tablet Take 1 tablet (600 mg total) every 6 (six) hours as needed by mouth. (Patient not taking: Reported on 08/18/2018) 30 tablet 1  . montelukast (SINGULAIR) 10 MG tablet Take 1 tablet (10 mg  total) by mouth at bedtime. (Patient not taking: Reported on 08/18/2018) 30 tablet 3   No facility-administered medications prior to visit.     No Known Allergies  ROS Review of Systems    Objective:    Physical Exam  Constitutional: She is oriented to person, place, and time. She appears well-developed and well-nourished.  HENT:  Head: Normocephalic and atraumatic.  Eyes: Conjunctivae and EOM are normal.  Cardiovascular: Normal rate, regular rhythm and normal heart sounds.  Pulmonary/Chest: Effort normal and breath sounds normal. No respiratory distress. She has no wheezes.  Musculoskeletal:       Back:     Comments: Straight leg raise negative, log roll negative Normal range of motion   Neurological: She is alert and oriented to person, place, and time. She has normal reflexes.  Skin: Skin is warm. No rash noted.  Psychiatric: She has a normal mood  and affect. Her behavior is normal. Judgment and thought content normal.    BP 132/82 (BP Location: Right Arm, Patient Position: Sitting, Cuff Size: Normal)   Pulse 67   Temp 98.7 F (37.1 C) (Oral)   Resp 17   Ht 5\' 2"  (1.575 m)   Wt 150 lb 6.4 oz (68.2 kg)   LMP 11/26/2016   SpO2 98%   BMI 27.51 kg/m  Wt Readings from Last 3 Encounters:  08/18/18 150 lb 6.4 oz (68.2 kg)  01/20/18 149 lb (67.6 kg)  10/05/17 148 lb 12.8 oz (67.5 kg)     Health Maintenance Due  Topic Date Due  . PAP SMEAR-Modifier  09/13/2015    There are no preventive care reminders to display for this patient.  Lab Results  Component Value Date   TSH 0.86 06/22/2015   Lab Results  Component Value Date   WBC 9.0 11/27/2016   HGB 8.2 (L) 11/27/2016   HCT 26.7 (L) 11/27/2016   MCV 73.2 (L) 11/27/2016   PLT 258 11/27/2016   Lab Results  Component Value Date   NA 140 08/18/2018   K 4.8 08/18/2018   CO2 23 08/18/2018   GLUCOSE 73 08/18/2018   BUN 9 08/18/2018   CREATININE 0.59 08/18/2018   BILITOT 0.3 06/22/2015   ALKPHOS 45 06/22/2015   AST 17 06/22/2015   ALT 10 06/22/2015   PROT 6.5 06/22/2015   ALBUMIN 3.6 06/22/2015   CALCIUM 9.0 08/18/2018   Lab Results  Component Value Date   CHOL 177 08/18/2018   Lab Results  Component Value Date   HDL 62 08/18/2018   Lab Results  Component Value Date   LDLCALC 107 (H) 08/18/2018   Lab Results  Component Value Date   TRIG 42 08/18/2018   Lab Results  Component Value Date   CHOLHDL 2.9 08/18/2018   Lab Results  Component Value Date   HGBA1C 5.5 08/18/2018      Assessment & Plan:   Problem List Items Addressed This Visit    None    Visit Diagnoses    Acute midline low back pain without sciatica    -  Primary Not concerning for infection  Advised flexeril Precautions reviewed   Relevant Medications   cyclobenzaprine (FLEXERIL) 5 MG tablet   Other Relevant Orders   POCT urinalysis dipstick (Completed)   Neck muscle spasm     -  Advised stretches and muscle relaxer   Tension headache    - discussed that her muscle tension is causing her headaches   Relevant Medications   cyclobenzaprine (FLEXERIL) 5  MG tablet   Prediabetes    - will evaluate   Relevant Orders   Hemoglobin A1c (Completed)   Lipid panel (Completed)   Basic metabolic panel (Completed)      Meds ordered this encounter  Medications  . cyclobenzaprine (FLEXERIL) 5 MG tablet    Sig: Take 1 tablet (5 mg total) by mouth 3 (three) times daily as needed for muscle spasms.    Dispense:  30 tablet    Refill:  1   A total of 25 minutes were spent face-to-face with the patient during this encounter and over half of that time was spent on counseling and coordination of care.  Follow-up: No follow-ups on file.    Forrest Moron, MD

## 2019-05-06 ENCOUNTER — Encounter: Payer: Self-pay | Admitting: Family Medicine

## 2019-05-06 ENCOUNTER — Ambulatory Visit (INDEPENDENT_AMBULATORY_CARE_PROVIDER_SITE_OTHER): Payer: BC Managed Care – PPO

## 2019-05-06 ENCOUNTER — Ambulatory Visit (INDEPENDENT_AMBULATORY_CARE_PROVIDER_SITE_OTHER): Payer: BC Managed Care – PPO | Admitting: Family Medicine

## 2019-05-06 ENCOUNTER — Other Ambulatory Visit: Payer: Self-pay

## 2019-05-06 VITALS — BP 121/72 | HR 79 | Temp 98.0°F | Resp 17 | Ht 62.0 in | Wt 155.2 lb

## 2019-05-06 DIAGNOSIS — M6289 Other specified disorders of muscle: Secondary | ICD-10-CM

## 2019-05-06 DIAGNOSIS — G44209 Tension-type headache, unspecified, not intractable: Secondary | ICD-10-CM | POA: Diagnosis not present

## 2019-05-06 DIAGNOSIS — M542 Cervicalgia: Secondary | ICD-10-CM

## 2019-05-06 MED ORDER — MELOXICAM 7.5 MG PO TABS: 7.5000 mg | ORAL_TABLET | Freq: Every day | ORAL | 0 refills | Status: DC

## 2019-05-06 MED ORDER — DICLOFENAC SODIUM 1 % EX GEL: 2.0000 g | Freq: Four times a day (QID) | CUTANEOUS | 1 refills | Status: DC

## 2019-05-06 NOTE — Patient Instructions (Addendum)
  FINDINGS: There is no evidence of cervical spine fracture or prevertebral soft tissue swelling. Alignment is normal. No other significant bone abnormalities are identified.  IMPRESSION: No fracture or static subluxation of the cervical spine. Disc spaces and vertebral body heights are preserved. Cervical disc and neural foraminal pathology may be further evaluated by MRI if indicated by localizing signs and symptoms.   Electronically Signed   By: Eddie Candle M.D.   On: 05/06/2019 15:18   If you have lab work done today you will be contacted with your lab results within the next 2 weeks.  If you have not heard from [place] then please contact us. The fastest way to get your results is to register for My Chart.   IF you received an x-ray today, you will receive an invoice from [facility]. Please contact [facility] at [phone] with questions or concerns regarding your invoice.   IF you received labwork today, you will receive an invoice from [place]. Please contact [facility] at [phone] with questions or concerns regarding your invoice.   Our billing staff will not be able to assist you with questions regarding bills from these companies.  You will be contacted with the lab results as soon as they are available. The fastest way to get your results is to activate your My Chart account. Instructions are located on the last page of this paperwork. If you have not heard from [place] regarding the results in 2 weeks, please contact this office.

## 2019-05-06 NOTE — Progress Notes (Signed)
Established Patient Office Visit  Subjective:  Patient ID: Paula [name], female    DOB: 10/14/1970  Age: 49 y.o. [name]: [id number]  CC:  Chief Complaint  Patient presents with  . f/u neck pain    pt c/o pain back of head, intermittent pain, pain level 7/10, not taking any otc med for pain. Pt questions if she needs ? testing to see where and why she is having thiis pain (?ct of head)    HPI Paula [name] presents for   Patient reports that she has tightness in her neck and scalp She states that it is worse when she flexes her neck Pain is 7/10 Started having pain at the back of the neck Feels like tightness No eye pain, no changes to range of motion No erythema     Past Medical History:  Diagnosis Date  . Allergy   . Anemia     Past Surgical History:  Procedure Laterality Date  . ABDOMINAL HYSTERECTOMY  11/26/2016   Procedure: HYSTERECTOMY ABDOMINAL;  Surgeon: Marylynn Pearson, MD;  Location: [place] [facility];  Service: Gynecology;;  . NO PAST SURGERIES      Family History  Problem Relation Age of Onset  . Hypertension Mother   . Hypertension Sister     Social History   Socioeconomic History  . Marital status: Single    Spouse name: Not on file  . Number of children: 3  . Years of education: 64  . Highest education level: Not on file  Occupational History  . Occupation: [employer]    Employer: [employer]  Tobacco Use  . Smoking status: Never Smoker  . Smokeless tobacco: Never Used  Substance and Sexual Activity  . Alcohol use: No  . Drug use: No  . Sexual activity: Not Currently  Other Topics Concern  . Not on file  Social History Narrative  . Not on file   Social Determinants of Health   Financial Resource Strain:   . Difficulty of Paying Living Expenses: Not on file  Food Insecurity:   . Worried About Charity fundraiser in the Last Year: Not on file  . Ran Out of Food in the Last Year: Not on file  Transportation Needs:   . Lack of Transportation (Medical):  Not on file  . Lack of Transportation (Non-Medical): Not on file  Physical Activity:   . Days of Exercise per Week: Not on file  . Minutes of Exercise per Session: Not on file  Stress:   . Feeling of Stress : Not on file  Social Connections:   . Frequency of Communication with Friends and Family: Not on file  . Frequency of Social Gatherings with Friends and Family: Not on file  . Attends Religious Services: Not on file  . Active Member of Clubs or Organizations: Not on file  . Attends Archivist Meetings: Not on file  . Marital Status: Not on file  Intimate Partner Violence:   . Fear of Current or Ex-Partner: Not on file  . Emotionally Abused: Not on file  . Physically Abused: Not on file  . Sexually Abused: Not on file    Outpatient Medications Prior to Visit  Medication Sig Dispense Refill  . cyclobenzaprine (FLEXERIL) 5 MG tablet Take 1 tablet (5 mg total) by mouth 3 (three) times daily as needed for muscle spasms. 30 tablet 1  . fluticasone (FLONASE) 50 MCG/ACT nasal spray Place 2 sprays into both nostrils daily. (Patient not taking: Reported on 08/18/2018) 16  g 6  . ibuprofen (ADVIL,MOTRIN) 600 MG tablet Take 1 tablet (600 mg total) every 6 (six) hours as needed by mouth. (Patient not taking: Reported on 08/18/2018) 30 tablet 1  . montelukast (SINGULAIR) 10 MG tablet Take 1 tablet (10 mg total) by mouth at bedtime. (Patient not taking: Reported on 08/18/2018) 30 tablet 3   No facility-administered medications prior to visit.    No Known Allergies  ROS Review of Systems Review of Systems  Constitutional: Negative for activity change, appetite change, chills and fever.  HENT: Negative for congestion, nosebleeds, trouble swallowing and voice change.   Respiratory: Negative for cough, shortness of breath and wheezing.   Gastrointestinal: Negative for diarrhea, nausea and vomiting.  Genitourinary: Negative for difficulty urinating, dysuria, flank pain and hematuria.    Musculoskeletal: Negative for back pain, joint swelling and neck pain.  Neurological: Negative for dizziness, speech difficulty, light-headedness and numbness.  See HPI. All other review of systems negative.     Objective:    Physical Exam  BP 121/72 (BP Location: Right Arm, Patient Position: Sitting, Cuff Size: Normal)   Pulse 79   Temp 98 F (36.7 C) (Oral)   Resp 17   Ht 5\' 2"  (1.575 m)   Wt 155 lb 3.2 oz (70.4 kg)   LMP 11/26/2016   BMI 28.39 kg/m  Wt Readings from Last 3 Encounters:  05/06/19 155 lb 3.2 oz (70.4 kg)  08/18/18 150 lb 6.4 oz (68.2 kg)  01/20/18 149 lb (67.6 kg)   Physical Exam  Constitutional: Oriented to person, place, and time. Appears well-developed and well-nourished.  HENT:  Head: Normocephalic and atraumatic.  Eyes: Conjunctivae and EOM are normal.  Cardiovascular: Normal rate, regular rhythm, normal heart sounds and intact distal pulses.  No murmur heard. Pulmonary/Chest: Effort normal and breath sounds normal. No stridor. No respiratory distress. Has no wheezes.  Neurological: Is alert and oriented to person, place, and time.  Skin: Skin is warm. Capillary refill takes less than 2 seconds.  Psychiatric: Has a normal mood and affect. Behavior is normal. Judgment and thought content normal.    Neck: neck stiffness noted with flexion without out any meningeal signs tightess on left side of neck  Spasm of scalp  Health Maintenance Due  Topic Date Due  . HIV Screening  10/27/1987  . PAP SMEAR-Modifier  09/13/2015    There are no preventive care reminders to display for this patient.  Lab Results  Component Value Date   TSH 0.86 06/22/2015   Lab Results  Component Value Date   WBC 9.0 11/27/2016   HGB 8.2 (L) 11/27/2016   HCT 26.7 (L) 11/27/2016   MCV 73.2 (L) 11/27/2016   PLT 258 11/27/2016   Lab Results  Component Value Date   NA 140 08/18/2018   K 4.8 08/18/2018   CO2 23 08/18/2018   GLUCOSE 73 08/18/2018   BUN 9 08/18/2018    CREATININE 0.59 08/18/2018   BILITOT 0.3 06/22/2015   ALKPHOS 45 06/22/2015   AST 17 06/22/2015   ALT 10 06/22/2015   PROT 6.5 06/22/2015   ALBUMIN 3.6 06/22/2015   CALCIUM 9.0 08/18/2018   Lab Results  Component Value Date   CHOL 177 08/18/2018   Lab Results  Component Value Date   HDL 62 08/18/2018   Lab Results  Component Value Date   LDLCALC 107 (H) 08/18/2018   Lab Results  Component Value Date   TRIG 42 08/18/2018   Lab Results  Component Value Date  CHOLHDL 2.9 08/18/2018   Lab Results  Component Value Date   HGBA1C 5.5 08/18/2018      Assessment & Plan:   Problem List Items Addressed This Visit    None    Visit Diagnoses    Neck pain on left side    -  Primary   Relevant Orders   DG Cervical Spine 2 or 3 views (Completed)   Ambulatory referral to Physical Therapy   Muscle tightness       Relevant Orders   Ambulatory referral to Physical Therapy   Tension headache       Relevant Medications   meloxicam (MOBIC) 7.5 MG tablet   Other Relevant Orders   Ambulatory referral to Physical Therapy     Treatment plan  EVAL AND TREATMENT BY PT MELOXICAM FOR PAIN ADVISED TOPICAL DICLOFENAC REVIEWED CXR WHICH WAS NEGATIVE FOR BONE DISORDER   Meds ordered this encounter  Medications  . diclofenac Sodium (VOLTAREN) 1 % GEL    Sig: Apply 2 g topically 4 (four) times daily.    Dispense:  150 g    Refill:  1  . meloxicam (MOBIC) 7.5 MG tablet    Sig: Take 1 tablet (7.5 mg total) by mouth daily. For muscle pain    Dispense:  30 tablet    Refill:  0    Follow-up: No follow-ups on file.    Forrest Moron, MD

## 2019-05-22 ENCOUNTER — Encounter: Payer: Self-pay | Admitting: Family Medicine

## 2019-05-22 ENCOUNTER — Telehealth (INDEPENDENT_AMBULATORY_CARE_PROVIDER_SITE_OTHER): Payer: BC Managed Care – PPO | Admitting: Family Medicine

## 2019-05-22 ENCOUNTER — Other Ambulatory Visit: Payer: Self-pay

## 2019-05-22 VITALS — BP 121/78

## 2019-05-22 DIAGNOSIS — J301 Allergic rhinitis due to pollen: Secondary | ICD-10-CM | POA: Diagnosis not present

## 2019-05-22 DIAGNOSIS — R0981 Nasal congestion: Secondary | ICD-10-CM

## 2019-05-22 MED ORDER — FLUTICASONE PROPIONATE 50 MCG/ACT NA SUSP: 2.0000 | Freq: Every day | NASAL | 6 refills | Status: DC

## 2019-05-22 NOTE — Progress Notes (Signed)
[place] VIDEO Encounter- SOAP NOTE Established Patient  This telephone encounter was conducted with the patient's (or proxy's) verbal consent via audio telecommunications: yes/no: Yes Patient was instructed to have this encounter in a suitably private space; and to only have persons present to whom they give permission to participate. In addition, patient identity was confirmed by use of name plus two identifiers (DOB and address).  I discussed the limitations, risks, security and privacy concerns of performing an evaluation and management service by telephone and the availability of in person appointments. I also discussed with the patient that there may be a patient responsible charge related to this service. The patient expressed understanding and agreed to proceed.  I spent a total of TIME; 0 MIN TO 60 MIN: 15 minutes talking with the patient or their proxy.  Chief Complaint  Patient presents with  . congestion x  3 days, no sore throat    per pt taking zyrtec and helping some.  No fevers, nausea or emesis, or abdominal pain.  Pt has nasal drainage that is yellow in color.    Subjective   Paula [name] is a 49 y.o. established patient. Telephone visit today for  HPI   Onset of symptoms 3 days ago Patient is taking zyrtec starting yesterday She denies headaches  She has yellow nasal drainage She denies sick contacts, fevers, nausea or emesis, or abdominal pain   She states that she is not taking singulair She reports that her eyes itch since the pollen started   Patient Active Problem List   Diagnosis Date Noted  . Sinus headache 06/14/2017  . Acute non-recurrent maxillary sinusitis 06/14/2017  . Fibroids 11/26/2016  . Amenorrhea 08/05/2012    Past Medical History:  Diagnosis Date  . Allergy   . Anemia     Current Outpatient Medications  Medication Sig Dispense Refill  . cyclobenzaprine (FLEXERIL) 5 MG tablet Take 1 tablet (5 mg total) by mouth 3 (three) times  daily as needed for muscle spasms. 30 tablet 1  . fluticasone (FLONASE) 50 MCG/ACT nasal spray Place 2 sprays into both nostrils daily. 16 g 6   No current facility-administered medications for this visit.    No Known Allergies  Social History   Socioeconomic History  . Marital status: Single    Spouse name: Not on file  . Number of children: 3  . Years of education: 46  . Highest education level: Not on file  Occupational History  . Occupation: [employer]    Employer: [employer]  Tobacco Use  . Smoking status: Never Smoker  . Smokeless tobacco: Never Used  Substance and Sexual Activity  . Alcohol use: No  . Drug use: No  . Sexual activity: Not Currently  Other Topics Concern  . Not on file  Social History Narrative  . Not on file   Social Determinants of Health   Financial Resource Strain:   . Difficulty of Paying Living Expenses:   Food Insecurity:   . Worried About Charity fundraiser in the Last Year:   . Arboriculturist in the Last Year:   Transportation Needs:   . Film/video editor (Medical):   Marland Kitchen Lack of Transportation (Non-Medical):   Physical Activity:   . Days of Exercise per Week:   . Minutes of Exercise per Session:   Stress:   . Feeling of Stress :   Social Connections:   . Frequency of Communication with Friends and Family:   . Frequency  of Social Gatherings with Friends and Family:   . Attends Religious Services:   . Active Member of Clubs or Organizations:   . Attends Archivist Meetings:   Marland Kitchen Marital Status:   Intimate Partner Violence:   . Fear of Current or Ex-Partner:   . Emotionally Abused:   Marland Kitchen Physically Abused:   . Sexually Abused:     ROS Review of Systems  Constitutional: Negative for activity change, appetite change, chills and fever.  HENT: Negative for congestion, nosebleeds, trouble swallowing and voice change.   Respiratory: Negative for cough, shortness of breath and wheezing.   Gastrointestinal: Negative for diarrhea,  nausea and vomiting.  Genitourinary: Negative for difficulty urinating, dysuria, flank pain and hematuria.  Musculoskeletal: Negative for back pain, joint swelling and neck pain.  Neurological: Negative for dizziness, speech difficulty, light-headedness and numbness.  See HPI. All other review of systems negative.   Objective   Vitals as reported by the patient: Today's Vitals   05/22/19 0835  BP: 121/78   Physical Exam  Constitutional: Oriented to person, place, and time. Appears well-developed and well-nourished.  HENT:  Head: Normocephalic and atraumatic.  Eyes: Conjunctivae and EOM are normal.  Pulmonary/Chest: Effort normal. No respiratory distress. Neurological: Is alert and oriented to person, place, and time.  Skin: Skin is warm. Capillary refill takes less than 2 seconds.  Psychiatric: Has a normal mood and affect. Behavior is normal. Judgment and thought content normal.   Symphonie was seen today for congestion x  3 days, no sore throat.  Diagnoses and all orders for this visit:  Nasal congestion  Seasonal allergic rhinitis due to pollen  Other orders -     fluticasone (FLONASE) 50 MCG/ACT nasal spray; Place 2 sprays into both nostrils daily.   Advised pt to use zyrtec daily, flonase and to wear a mask and wash her face. Pt declined singulair   I discussed the assessment and treatment plan with the patient. The patient was provided an opportunity to ask questions and all were answered. The patient agreed with the plan and demonstrated an understanding of the instructions.   The patient was advised to call back or seek an in-person evaluation if the symptoms worsen or if the condition fails to improve as anticipated.  I provided 15 minutes of [place] face-to-face time during this encounter.  Forrest Moron, MD  Primary Care at [facility]

## 2019-05-22 NOTE — Patient Instructions (Signed)
° ° ° °  If you have lab work done today you will be contacted with your lab results within the next 2 weeks.  If you have not heard from us then please contact us. The fastest way to get your results is to register for My Chart. ° ° °IF you received an x-ray today, you will receive an invoice from [facility]. Please contact [facility] at [phone] with questions or concerns regarding your invoice.  ° °IF you received labwork today, you will receive an invoice from [facility]. Please contact [facility] at [phone] with questions or concerns regarding your invoice.  ° °Our billing staff will not be able to assist you with questions regarding bills from these companies. ° °You will be contacted with the lab results as soon as they are available. The fastest way to get your results is to activate your My Chart account. Instructions are located on the last page of this paperwork. If you have not heard from us regarding the results in 2 weeks, please contact this office. °  ° ° ° °

## 2019-05-26 ENCOUNTER — Ambulatory Visit: Payer: BC Managed Care – PPO | Admitting: Physical Therapy

## 2019-06-17 ENCOUNTER — Ambulatory Visit: Payer: BC Managed Care – PPO | Admitting: Physical Therapy

## 2019-06-23 ENCOUNTER — Telehealth: Payer: Self-pay | Admitting: Family Medicine

## 2019-06-23 NOTE — Telephone Encounter (Signed)
Called pt LVM to call back to do a toc with another provider

## 2019-07-01 ENCOUNTER — Other Ambulatory Visit: Payer: Self-pay

## 2019-07-01 ENCOUNTER — Ambulatory Visit: Payer: BC Managed Care – PPO | Attending: Family Medicine | Admitting: Physical Therapy

## 2019-07-01 ENCOUNTER — Encounter: Payer: Self-pay | Admitting: Physical Therapy

## 2019-07-01 DIAGNOSIS — M542 Cervicalgia: Secondary | ICD-10-CM | POA: Diagnosis not present

## 2019-07-01 NOTE — Therapy (Signed)
[facility] [address], [place], [place], [address] Phone: [phone]   Fax:  [phone]  Physical Therapy Evaluation  Patient Details  Name: Paula [name] [name]: [id number] Date of Birth: 1970-07-25 Referring Provider (PT): Delia Chimes, MD   Encounter Date: 07/01/2019  PT End of Session - 07/01/19 1449    Visit Number  1    Date for PT Re-Evaluation  08/26/19    PT Start Time  1448    PT Stop Time  1529    PT Time Calculation (min)  41 min    Activity Tolerance  Patient tolerated treatment well    Behavior During Therapy  [facility] for tasks assessed/performed       Past Medical History:  Diagnosis Date  . Allergy   . Anemia     Past Surgical History:  Procedure Laterality Date  . ABDOMINAL HYSTERECTOMY  11/26/2016   Procedure: HYSTERECTOMY ABDOMINAL;  Surgeon: Marylynn Pearson, MD;  Location: [facility];  Service: Gynecology;;  . NO PAST SURGERIES      There were no vitals filed for this visit.   Subjective Assessment - 07/01/19 1453    Subjective  Patient having tightness in suboccipital area x 4 months and now spreading to her head. She denies HA. 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    Currently in Pain?  Yes    Pain Score  8     Pain Location  Neck    Pain Orientation  Right;Left;Posterior    Pain Descriptors / Indicators  Tightness    Pain Type  Acute pain    Pain Onset  More than a month ago    Pain Frequency  Constant    Aggravating Factors   neck flexion    Pain Relieving Factors  no relief from muscle relaxors         [facility] PT Assessment - 07/01/19 0001      Assessment   Medical Diagnosis  neck pain on left side; muscle tightness    Referring Provider (PT)  Delia Chimes, MD    Onset Date/Surgical Date  03/13/19    Hand Dominance  Right      Precautions   Precautions  None      Restrictions   Weight Bearing Restrictions  No      Balance Screen   Has the  patient fallen in the past 6 months  No    Has the patient had a decrease in activity level because of a fear of falling?   No    Is the patient reluctant to leave their home because of a fear of falling?   No      Home Film/video editor residence      Prior Function   Level of Independence  Independent    Vocation  Full time employment    Vocation Requirements  cleans at [employer] / Strength   AROM / PROM / Strength  AROM;Strength      AROM   AROM Assessment Site  Cervical    Cervical Flexion  full    Cervical Extension  full    Cervical - Right Side Bend  8    Cervical - Left Side Bend  12    Cervical - Right Rotation  20    Cervical - Left Rotation  30      Strength   Overall Strength Comments  cervical 4 to 4-/5 except capital flexion 5/5      Flexibility   Soft Tissue Assessment /Muscle Length  --   marked UT and SCM tightness     Palpation   Spinal mobility  marked hypomobility in cervical and thoracic spines    Palpation comment  increased tone and TPs in bil UT, cervical paraspinals and suboccipitals.                 Objective measurements completed on examination: See above findings.      [facility] Adult PT Treatment/Exercise - 07/01/19 0001      Manual Therapy   Manual Therapy  Soft tissue mobilization    Manual therapy comments  skilled palpation and monitoring of soft tissues during DN    Soft tissue mobilization  to bil UT and cervical spine       Trigger Point Dry Needling - 07/01/19 0001    Consent Given?  Yes    Education Handout Provided  Yes    Muscles Treated Head and Neck  Upper trapezius;Suboccipitals    Upper Trapezius Response  Twitch reponse elicited;Palpable increased muscle length    Suboccipitals Response  Twitch response elicited;Palpable increased muscle length           PT Education - 07/01/19 1543    Education Details  HEP; DN education and aftercare; postural ed    Person(s) Educated   Patient    Methods  Explanation;Demonstration;Handout    Comprehension  Verbalized understanding;Returned demonstration       PT Short Term Goals - 07/01/19 1546      PT SHORT TERM GOAL #1   Title  Ind with initial HEP    Time  4    Period  Weeks    Status  New    Target Date  07/29/19        PT Long Term Goals - 07/01/19 1546      PT LONG TERM GOAL #1   Title  Decreased neck pain and tightness by 75% or more with ADLs.    Time  8    Period  Weeks    Status  New    Target Date  08/26/19      PT LONG TERM GOAL #2   Title  Patient to demonstrate improved cervical rotation to 50 deg bil or more to ease driving.    Time  8    Period  Weeks    Status  New      PT LONG TERM GOAL #3   Title  Patient able to sleep without waking from pain.    Time  8    Period  Weeks    Status  New      PT LONG TERM GOAL #4   Title  Patient independent in HEP to improve posture, strength and ROM to prevent further injury.    Time  8    Period  Weeks    Status  New    Target Date  08/26/19             Plan - 07/01/19 1533    Clinical Impression Statement  Patient presents with c/o of bil neck tightness and pain starting four months ago; insidious onset. She has decreased cervical rotation and SB, decreased cervical strength, and marked tightness and TPs in bil UT, suboccipitals and paraspinals. She also has marked hypomobility in the cervical and thoracic spine. Her posture is good in standing but upon sitting is very  slouched with forward head. Deficits are affecting all ADLs including sleep. She works as a Scientist, product/process development at [employer] and has constant pain/tightness with work requirements as well. She responded very well to DN and manual therapy today and will benefit from further PT to address her deficits.    Personal Factors and Comorbidities  Comorbidity 1    Comorbidities  OA    Examination-Activity Limitations  Sleep;Lift    Stability/Clinical Decision Making  Stable/Uncomplicated     Clinical Decision Making  Low    Rehab Potential  Excellent    PT Frequency  2x / week    PT Duration  8 weeks    PT Treatment/Interventions  ADLs/Self Care Home Management;Cryotherapy;Electrical Stimulation;Moist Heat;Therapeutic exercise;Therapeutic activities;Neuromuscular re-education;Manual techniques;Patient/family education;Dry needling;Spinal Manipulations;Taping    PT Next Visit Plan  Assess DN (may need cervical multifidi); review HEP; spinal mobs cerv/thoracic; ROM/spinal mobility; cerv strengthening    PT Home Exercise Plan  [id number]    Consulted and Agree with Plan of Care  Patient       Patient will benefit from skilled therapeutic intervention in order to improve the following deficits and impairments:  Decreased range of motion, Pain, Increased muscle spasms, Impaired flexibility, Hypomobility, Decreased strength, Postural dysfunction  Visit Diagnosis: Cervicalgia - Plan: PT plan of care cert/re-cert     Problem List Patient Active Problem List   Diagnosis Date Noted  . Sinus headache 06/14/2017  . Acute non-recurrent maxillary sinusitis 06/14/2017  . Fibroids 11/26/2016  . Amenorrhea 08/05/2012    Madelyn Flavors PT 07/01/2019, 3:57 PM  [facility] [address], [place], [place], [address] Phone: [phone]   Fax:  [phone]  Name: Shonice Curling [name]: [id number] Date of Birth: 1970-04-10

## 2019-07-01 NOTE — Patient Instructions (Signed)
Access Code: [email or web address] URL: [email or web address] Date: 07/01/2019 Prepared by: Almyra Free Kerianne Gurr  Exercises Seated Cervical Rotation AROM - 1 x daily - 7 x weekly - 10 reps - 1 sets Seated Cervical Sidebending AROM - 1 x daily - 7 x weekly - 1 sets - 10 reps Seated Cervical Retraction - 3 x daily - 7 x weekly - 10 reps - 1 sets - 3-5 sec hold Seated Scapular Retraction - 2 x daily - 7 x weekly - 1 sets - 10 reps - 2-3 sec hold  Patient Education Trigger Point Dry Needling Office Posture

## 2019-07-27 ENCOUNTER — Other Ambulatory Visit: Payer: Self-pay

## 2019-07-27 ENCOUNTER — Ambulatory Visit: Payer: BC Managed Care – PPO | Attending: Family Medicine | Admitting: Physical Therapy

## 2019-07-27 DIAGNOSIS — M542 Cervicalgia: Secondary | ICD-10-CM | POA: Insufficient documentation

## 2019-07-27 NOTE — Therapy (Addendum)
[facility] [address] [place], [place], [address] Phone: [phone]   Fax:  [phone]  Physical Therapy Treatment/Discharge Summary   Patient Details  Name: Paula [name] [name]: [id number] Date of Birth: 1970-09-29 Referring Provider (PT): Delia Chimes, MD   Encounter Date: 07/27/2019  PT End of Session - 07/27/19 1231    Visit Number  2    Date for PT Re-Evaluation  08/26/19    PT Start Time  1147    PT Stop Time  1229    PT Time Calculation (min)  42 min    Activity Tolerance  Patient limited by pain       Past Medical History:  Diagnosis Date  . Allergy   . Anemia     Past Surgical History:  Procedure Laterality Date  . ABDOMINAL HYSTERECTOMY  11/26/2016   Procedure: HYSTERECTOMY ABDOMINAL;  Surgeon: Marylynn Pearson, MD;  Location: [facility];  Service: Gynecology;;  . NO PAST SURGERIES      There were no vitals filed for this visit.  Subjective Assessment - 07/27/19 1149    Subjective  The patient returns after nearly 4 weeks from initial evaluation.  I'm still the same.  The DN was good.  No pain at rest but looking down will be painful.  I think something is wrong with my head.  My whole head is tight.    Currently in Pain?  No/denies    Pain Score  0-No pain    Pain Location  Neck                        [facility] Adult PT Treatment/Exercise - 07/27/19 0001      Self-Care   Self-Care  Other Self-Care Comments    Other Self-Care Comments   tennis ball suboccipital release       Neck Exercises: Seated   Neck Retraction  5 reps    Other Seated Exercise  self seated suboccipital stretch 3x 20 sec       Neck Exercises: Supine   Neck Retraction  10 reps      Moist Heat Therapy   Number Minutes Moist Heat  3 Minutes    Moist Heat Location  Cervical      Manual Therapy   Soft tissue mobilization  to bil UT and cervical spine    Myofascial Release  suboccipital release 2 min x 2     Manual Traction  60 sec      Neck Exercises: Stretches   Other Neck Stretches  neck flexion with hands applying overpressure 3x 20 sec        Trigger Point Dry Needling - 07/27/19 0001    Consent Given?  Yes    Muscles Treated Head and Neck  Cervical multifidi    Upper Trapezius Response  Twitch reponse elicited;Palpable increased muscle length    Suboccipitals Response  not performed   Cervical multifidi Response  Palpable increased muscle length           PT Education - 07/27/19 1229    Education Details  [id number]  supine cervical retraction, tennis ball suboccipital release (pt given balls for home); seated neck flexion with overpressure stretch; seated suboccipital stretch    Person(s) Educated  Patient    Methods  Explanation;Demonstration;Handout    Comprehension  Returned demonstration;Verbalized understanding       PT Short Term Goals - 07/01/19 1546      PT SHORT  TERM GOAL #1   Title  Ind with initial HEP    Time  4    Period  Weeks    Status  New    Target Date  07/29/19        PT Long Term Goals - 07/01/19 1546      PT LONG TERM GOAL #1   Title  Decreased neck pain and tightness by 75% or more with ADLs.    Time  8    Period  Weeks    Status  New    Target Date  08/26/19      PT LONG TERM GOAL #2   Title  Patient to demonstrate improved cervical rotation to 50 deg bil or more to ease driving.    Time  8    Period  Weeks    Status  New      PT LONG TERM GOAL #3   Title  Patient able to sleep without waking from pain.    Time  8    Period  Weeks    Status  New      PT LONG TERM GOAL #4   Title  Patient independent in HEP to improve posture, strength and ROM to prevent further injury.    Time  8    Period  Weeks    Status  New    Target Date  08/26/19            Plan - 07/27/19 1212    Clinical Impression Statement  The patient complains of continued upper cervical and head pain.  She is receptive to DN (had limited amount  performed on initial eval) but she experienced intense discomfort today with cervical multifidi DN and upper traps.   DN was discontinued before treating suboccipitals however we addressed these shortened muscles manually.  She was instructed in upper cervical spine flexion exercises for home.  Moderate cues needed for correct technique with cervical retraction.  Therapist very closely monitoring response with all treatment interventions.    Rehab Potential  Excellent    PT Frequency  2x / week    PT Duration  8 weeks    PT Treatment/Interventions  ADLs/Self Care Home Management;Cryotherapy;Electrical Stimulation;Moist Heat;Therapeutic exercise;Therapeutic activities;Neuromuscular re-education;Manual techniques;Patient/family education;Dry needling;Spinal Manipulations;Taping    PT Next Visit Plan  Hold on cervical multifidi DN but pt may (or may not) be receptive to Dn to suboccipitals only;  manual therapy;  pt preparing for 2 week trip to [place]  [id number]       Patient will benefit from skilled therapeutic intervention in order to improve the following deficits and impairments:  Decreased range of motion, Pain, Increased muscle spasms, Impaired flexibility, Hypomobility, Decreased strength, Postural dysfunction  Visit Diagnosis: Cervicalgia    PHYSICAL THERAPY DISCHARGE SUMMARY  Visits from Start of Care: 2 Current functional level related to goals / functional outcomes: The patient cancelled her last scheduled appt and has not called to reschedule in the last 2 months.  Will Discharge from PT at this time.     Remaining deficits: As above   Education / Equipment: Basic HEP and self care Plan:                                                    Patient goals were  not met. Patient is being discharged due to not returning since the last visit.  ?????      Problem List Patient Active Problem List   Diagnosis Date Noted  . Sinus headache 06/14/2017  . Acute  non-recurrent maxillary sinusitis 06/14/2017  . Fibroids 11/26/2016  . Amenorrhea 08/05/2012   Ruben Im, PT 07/27/19 12:42 PM Phone: [phone] Fax: [phone] Alvera Singh 07/27/2019, 12:40 PM  [facility] [address], [place], [place], [address] Phone: [phone]   Fax:  [phone]  Name: Paula [name] [name]: [id number] Date of Birth: 12/11/1970

## 2019-07-27 NOTE — Patient Instructions (Signed)
Access Code: [email or web address] URL: [email or web address] Date: 07/27/2019 Prepared by: Ruben Im  Exercises Seated Cervical Rotation AROM - 1 x daily - 7 x weekly - 10 reps - 1 sets Seated Cervical Sidebending AROM - 1 x daily - 7 x weekly - 1 sets - 10 reps Seated Cervical Retraction - 3 x daily - 7 x weekly - 10 reps - 1 sets - 3-5 sec hold Seated Scapular Retraction - 2 x daily - 7 x weekly - 1 sets - 10 reps - 2-3 sec hold Supine Chin Tuck - 1 x daily - 7 x weekly - 1 sets - 10 reps Supine Suboccipital Release with Tennis Balls - 1 x daily - 7 x weekly - 1 sets - 1 reps - 60 hold Sub-Occipital Cervical Stretch - 1 x daily - 7 x weekly - 1 sets - 3 reps - 20 hold Seated Cervical Flexion Stretch with Finger Support Behind Neck - 1 x daily - 7 x weekly - 1 sets - 3 reps - 20 hold  Patient Education Trigger Point Dry Needling Office Posture

## 2019-07-29 ENCOUNTER — Encounter: Payer: BC Managed Care – PPO | Admitting: Physical Therapy

## 2019-11-26 ENCOUNTER — Other Ambulatory Visit: Payer: Self-pay

## 2019-11-26 ENCOUNTER — Ambulatory Visit: Payer: BC Managed Care – PPO | Admitting: Family Medicine

## 2019-11-26 ENCOUNTER — Encounter: Payer: Self-pay | Admitting: Family Medicine

## 2019-11-26 VITALS — BP 114/73 | HR 87 | Temp 98.1°F | Resp 16 | Ht 62.0 in | Wt 145.6 lb

## 2019-11-26 DIAGNOSIS — M542 Cervicalgia: Secondary | ICD-10-CM

## 2019-11-26 DIAGNOSIS — M62838 Other muscle spasm: Secondary | ICD-10-CM | POA: Diagnosis not present

## 2019-11-26 MED ORDER — METHOCARBAMOL 500 MG PO TABS: 500.0000 mg | ORAL_TABLET | Freq: Three times a day (TID) | ORAL | 1 refills | Status: DC

## 2019-11-26 MED ORDER — DICLOFENAC SODIUM 75 MG PO TBEC: 75.0000 mg | DELAYED_RELEASE_TABLET | Freq: Two times a day (BID) | ORAL | 1 refills | Status: DC

## 2019-11-26 NOTE — Patient Instructions (Addendum)
Take diclofenac 75 mg 1 twice daily for a couple of weeks for pain and inflammation in the neck.  Take with food.  Do not take Advil or Aleve (ibuprofen or naproxen) when on this medication.  Take the methocarbamol muscle relaxant 500 mg 1 in the morning, 1 in the afternoon, and 1 at bedtime.  If it causes you drowsiness you can decrease the daytime once to 1/2 pill if necessary.  In addition you can take acetaminophen (Tylenol, paracetamol) 500 mg 2 pills maximum of 3 times daily (do not exceed 3000 mg in 24 hours) for more severe pain.  Resume your physical therapy.  I think this pain is all from the muscles and ligaments of the neck, and do not feel like additional scans or special testing or referrals are needed at this time.  If you are not improving please return.  However people that have these pains do tend to get them off and on and there is probably no 100% cure for them.  Continue doing the exercises that physical therapy teaches you when you are at home.  Return if further problems   If you have lab work done today you will be contacted with your lab results within the next 2 weeks.  If you have not heard from [place] then please contact us. The fastest way to get your results is to register for My Chart.   IF you received an x-ray today, you will receive an invoice from [facility]. Please contact [facility] at [phone] with questions or concerns regarding your invoice.   IF you received labwork today, you will receive an invoice from [facility]. Please contact [facility] at [phone] with questions or concerns regarding your invoice.   Our billing staff will not be able to assist you with questions regarding bills from these companies.  You will be contacted with the lab results as soon as they are available. The fastest way to get your results is to activate your My Chart account. Instructions are located on the last page of this paperwork. If you have not heard  from [place] regarding the results in 2 weeks, please contact this office.

## 2019-11-26 NOTE — Progress Notes (Signed)
Patient ID: Paula [name], female    DOB: 07-Feb-1971  Age: 49 y.o. [name]: [id number]  Chief Complaint  Patient presents with  . Neck Pain    pt has been dealing with neck and head pain for some time saw neuro and has not gotten any relief would like to be reevaluated and a new treatment established     Subjective:   49 year old lady who is here complaining of pain in the back of her neck and head.  There is no radiation of the pain.  She has been having this off and on for a long time.  It gets pretty bad at times.  No associated dizziness or nausea or vomiting or other symptoms.  No radicular pain.  It was x-rayed about 6 months ago and everything looked good.  I reviewed the films with her.  The patient grew up in [place], coming to the [place] about 20 years ago.  She is married and has 3 upper teenage children.  Has never had any major trauma to her neck or head.  When she was young and gone she lives with an aunt and did have to do some African daily chores such as carrying water on her head at times, but that was not a huge or regular task for her.  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  Current allergies, medications, problem list, past/family and social histories reviewed.  Objective:  BP 114/73   Pulse 87   Temp 98.1 F (36.7 C) (Temporal)   Resp 16   Ht 5\' 2"  (1.575 m)   Wt 145 lb 9.6 oz (66 kg)   LMP 11/26/2016   SpO2 99%   BMI 26.63 kg/m   Pleasant alert lady in no acute distress.  Full range of motion of her cervical . some tenderness in the occipital region and in the midline of the cervical spine has only mild tenderness.  Not much paraspinous tenderness.  There is some pain on rotation and tilt of the head.  Her lumbar spine is good full range of motion.  Motor strength is good in the upper extremities and sensory is normal. Assessment & Plan:   Assessment: 1. Neck muscle spasm   2. Arthralgia of cervical spine       Plan: See  instructions  No orders of the defined types were placed in this encounter.   No orders of the defined types were placed in this encounter.        Patient Instructions   Take diclofenac 75 mg 1 twice daily for a couple of weeks for pain and inflammation in the neck.  Take with food.  Do not take Advil or Aleve (ibuprofen or naproxen) when on this medication.  Take the methocarbamol muscle relaxant 500 mg 1 in the morning, 1 in the afternoon, and 1 at bedtime.  If it causes you drowsiness you can decrease the daytime once to 1/2 pill if necessary.  In addition you can take acetaminophen (Tylenol, paracetamol) 500 mg 2 pills maximum of 3 times daily (do not exceed 3000 mg in 24 hours) for more severe pain.  Resume your physical therapy.  I think this pain is all from the muscles and ligaments of the neck, and do not feel like additional scans or special testing or referrals are needed at this time.  If you are not improving please return.  However people that have these pains do tend to get them off and  on and there is probably no 100% cure for them.  Continue doing the exercises that physical therapy teaches you when you are at home.  Return if further problems   If you have lab work done today you will be contacted with your lab results within the next 2 weeks.  If you have not heard from [place] then please contact us. The fastest way to get your results is to register for My Chart.   IF you received an x-ray today, you will receive an invoice from [facility]. Please contact [facility] at [phone] with questions or concerns regarding your invoice.   IF you received labwork today, you will receive an invoice from [facility]. Please contact [facility] at [phone] with questions or concerns regarding your invoice.   Our billing staff will not be able to assist you with questions regarding bills from these companies.  You will be contacted with the lab results as  soon as they are available. The fastest way to get your results is to activate your My Chart account. Instructions are located on the last page of this paperwork. If you have not heard from [place] regarding the results in 2 weeks, please contact this office.        Return if symptoms worsen or fail to improve.   Ruben Reason, MD 11/26/2019

## 2019-12-23 ENCOUNTER — Other Ambulatory Visit: Payer: Self-pay

## 2019-12-23 ENCOUNTER — Ambulatory Visit: Payer: BC Managed Care – PPO | Admitting: Family Medicine

## 2019-12-23 ENCOUNTER — Other Ambulatory Visit: Payer: Self-pay | Admitting: Family Medicine

## 2019-12-23 ENCOUNTER — Encounter: Payer: Self-pay | Admitting: Family Medicine

## 2019-12-23 VITALS — BP 130/80 | HR 70 | Temp 97.9°F | Ht 62.0 in | Wt 146.4 lb

## 2019-12-23 DIAGNOSIS — Z23 Encounter for immunization: Secondary | ICD-10-CM

## 2019-12-23 DIAGNOSIS — M542 Cervicalgia: Secondary | ICD-10-CM | POA: Insufficient documentation

## 2019-12-23 MED ORDER — MELOXICAM 7.5 MG PO TABS: 7.5000 mg | ORAL_TABLET | Freq: Every day | ORAL | 0 refills | Status: DC

## 2019-12-23 MED ORDER — DICLOFENAC SODIUM 1 % EX GEL: 2.0000 g | Freq: Four times a day (QID) | CUTANEOUS | 1 refills | Status: DC

## 2019-12-23 NOTE — Telephone Encounter (Signed)
Pt was seen on 12/23/19 and rx sent. Do you mind looking into this PA?

## 2019-12-23 NOTE — Patient Instructions (Addendum)
Please Schedule your annual physical Physical Therarpy referral sent. Take Meloxicam once daily Use Voltaren gel up to 4 times per day on affected area   Managing Pain Without Opioids Opioids are strong medicines used to treat moderate to severe pain. For some people, especially those who have long-term (chronic) pain, opioids may not be the best choice for pain management due to:  Side effects like nausea, constipation, and sleepiness.  The risk of addiction (opioid use disorder). The longer you take opioids, the greater your risk of addiction. Pain that lasts for more than 3 months is called chronic pain. Managing chronic pain usually requires more than one approach and is often provided by a team of health care providers working together (multidisciplinary approach). Pain management may be done at a pain management center or pain clinic. Types of pain management without opioids Managing pain without opioids can involve:  Non-opioid medicines.  Exercises to help relieve pain and improve strength and range of motion (physical therapy).  Therapy to help with everyday tasks and activities (occupational therapy).  Therapy to help you find ways to relieve pain by doing things you enjoy (recreational therapy).  Talk therapy (psychotherapy) and other mental health therapies.  Medical treatments such as injections or devices.  Making lifestyle changes. Pain management options Non-opioid medicines Non-opioid medicines for pain may include medicines taken by mouth (oral medicines), such as:  Over-the-counter or prescription NSAIDs. These may be the first medicines used for pain. They work well for muscle and bone pain, and they reduce swelling.  Acetaminophen. This over-the-counter medicine may work well for milder pain but not swelling.  Antidepressants. These may be used to treat chronic pain. A certain type of antidepressant (tricyclics) is often used. These medicines are given in  lower doses for pain than when used for depression.  Anticonvulsants. These are usually used to treat seizures but may also reduce nerve (neuropathic) pain.  Muscle relaxants. These relieve pain caused by sudden muscle tightening (spasms). You may also use a type of pain medicine that is applied to the skin as a patch, cream, or gel (topical analgesic), such as a numbing medicine. These may cause fewer side effects than oral medicines. Therapy Physical therapy involves doing exercises to gain strength and flexibility. A physical therapist may teach you exercises to move and stretch parts of your body that are weak, stiff, or painful. You can learn these exercises at physical therapy visits and practice them at home. Physical therapy may also involve:  Massage.  Heat wraps or applying heat or cold to affected areas.  Sending electrical signals through the skin to interrupt pain signals (transcutaneous electrical nerve stimulation, TENS).  Sending weak lasers through the skin to reduce pain and swelling (low-level laser therapy).  Using signals from your body to help you learn to regulate pain (biofeedback). Occupational therapy helps you learn ways to function at home and work with less pain. Recreational therapy may involve trying new activities or hobbies, such as drawing or a physical activity. Types of mental health therapy for pain include:  Cognitive behavioral therapy (CBT) to help you learn coping skills for dealing with pain.  Acceptance and commitment therapy (ACT) to change the way you think and react to pain.  Relaxation therapies, including muscle relaxation exercises and focusing your mind on the present moment to lower stress (mindfulness-based stress reduction).  Pain management counseling. This may be individual, family, or group counseling.  Medical treatments Medical treatments for pain management include:  Nerve  block injections. These may include a pain blocker  and anti-inflammatory medicines. You may have injections: ? Near the spine to relieve chronic back or neck pain. ? Into joints to relieve back or joint pain. ? Into nerve areas that supply a painful area to relieve body pain. ? Into muscles (trigger point injections) to relieve some painful muscle conditions.  A medical device placed near your spine to help block pain signals and relieve nerve pain or chronic back pain (spinal cord stimulation device).  Acupuncture. Follow these instructions at home Medicines  Take over-the-counter and prescription medicines only as told by your health care provider.  If you are taking pain medicine, ask your health care providers about possible side effects to watch out for.  Do not drive or use heavy machinery while taking prescription pain medicine. Lifestyle   Do not use drugs or alcohol to reduce pain. Limit alcohol intake to no more than 1 drink a day for nonpregnant women and 2 drinks a day for men. One drink equals 12 oz of beer, 5 oz of wine, or 1 oz of hard liquor.  Do not use any products that contain nicotine or tobacco, such as cigarettes and e-cigarettes. These can delay healing. If you need help quitting, ask your health care provider.  Eat a healthy diet and maintain a healthy weight. Poor diet and excess weight may make pain worse. ? Eat foods that are high in fiber. These include fresh fruits and vegetables, whole grains, and beans. ? Limit foods that are high in fat and processed sugars, such as fried and sweet foods.  Exercise regularly. Exercise lowers stress and may help relieve pain. ? Ask your health care provider what activities and exercises are safe for you. ? If your health care provider approves, join an exercise class that combines movement and stress reduction. Examples include yoga and tai chi.  Get enough sleep. Lack of sleep may make pain worse.  Lower stress as much as possible. Practice stress reduction  techniques as told by your therapist. General instructions  Work with all your pain management providers to find the treatments that work best for you. You are an important member of your pain management team. There are many things you can do to reduce pain on your own.  Consider joining an online or in-person support group for people who have chronic pain.  Keep all follow-up visits as told by your health care providers. This is important. Where to find more information You can find more information about managing pain without opioids from:  American Academy of Pain Medicine: painmed.[place] for Chronic Pain: instituteforchronicpain.org  American Chronic Pain Association: theacpa.org Contact a health care provider if:  You have side effects from pain medicine.  Your pain gets worse or does not get better with treatments or home care.  You are struggling with anxiety or depression. Summary  Many types of pain can be managed without opioids. Chronic pain may respond better to pain management without opioids.  Pain is best managed with a team of providers working together.  Pain management without opioids may include non-opioid medicines, medical treatments, physical therapy, mental health therapy, and lifestyle changes.  Tell your health care providers if your pain gets worse or is not being managed well enough. This information is not intended to replace advice given to you by your health care provider. Make sure you discuss any questions you have with your health care provider. Document Revised: 11/19/2018 Document Reviewed: 12/18/2016  Elsevier Patient Education  [employer].   If you have lab work done today you will be contacted with your lab results within the next 2 weeks.  If you have not heard from [place] then please contact us. The fastest way to get your results is to register for My Chart.   IF you received an x-ray today, you will receive an invoice from  [facility]. Please contact [facility] at [phone] with questions or concerns regarding your invoice.   IF you received labwork today, you will receive an invoice from [facility]. Please contact [facility] at [phone] with questions or concerns regarding your invoice.   Our billing staff will not be able to assist you with questions regarding bills from these companies.  You will be contacted with the lab results as soon as they are available. The fastest way to get your results is to activate your My Chart account. Instructions are located on the last page of this paperwork. If you have not heard from [place] regarding the results in 2 weeks, please contact this office.      Cervical Strain and Sprain Rehab Ask your health care provider which exercises are safe for you. Do exercises exactly as told by your health care provider and adjust them as directed. It is normal to feel mild stretching, pulling, tightness, or discomfort as you do these exercises. Stop right away if you feel sudden pain or your pain gets worse. Do not begin these exercises until told by your health care provider. Stretching and range-of-motion exercises Cervical side bending  1. Using good posture, sit on a stable chair or stand up. 2. Without moving your shoulders, slowly tilt your left / right ear to your shoulder until you feel a stretch in the opposite side neck muscles. You should be looking straight ahead. 3. Hold for __________ seconds. 4. Repeat with the other side of your neck. Repeat __________ times. Complete this exercise __________ times a day. Cervical rotation  1. Using good posture, sit on a stable chair or stand up. 2. Slowly turn your head to the side as if you are looking over your left / right shoulder. ? Keep your eyes level with the ground. ? Stop when you feel a stretch along the side and the back of your neck. 3. Hold for __________ seconds. 4. Repeat this by turning to your  other side. Repeat __________ times. Complete this exercise __________ times a day. Thoracic extension and pectoral stretch 1. Roll a towel or a small blanket so it is about 4 inches (10 cm) in diameter. 2. Lie down on your back on a firm surface. 3. Put the towel lengthwise, under your spine in the middle of your back. It should not be under your shoulder blades. The towel should line up with your spine from your middle back to your lower back. 4. Put your hands behind your head and let your elbows fall out to your sides. 5. Hold for __________ seconds. Repeat __________ times. Complete this exercise __________ times a day. Strengthening exercises Isometric upper cervical flexion 1. Lie on your back with a thin pillow behind your head and a small rolled-up towel under your neck. 2. Gently tuck your chin toward your chest and nod your head down to look toward your feet. Do not lift your head off the pillow. 3. Hold for __________ seconds. 4. Release the tension slowly. Relax your neck muscles completely before you repeat this exercise. Repeat __________ times. Complete this  exercise __________ times a day. Isometric cervical extension  1. Stand about 6 inches (15 cm) away from a wall, with your back facing the wall. 2. Place a soft object, about 6-8 inches (15-20 cm) in diameter, between the back of your head and the wall. A soft object could be a small pillow, a ball, or a folded towel. 3. Gently tilt your head back and press into the soft object. Keep your jaw and forehead relaxed. 4. Hold for __________ seconds. 5. Release the tension slowly. Relax your neck muscles completely before you repeat this exercise. Repeat __________ times. Complete this exercise __________ times a day. Posture and body mechanics Body mechanics refers to the movements and positions of your body while you do your daily activities. Posture is part of body mechanics. Good posture and healthy body mechanics can help  to relieve stress in your body's tissues and joints. Good posture means that your spine is in its natural S-curve position (your spine is neutral), your shoulders are pulled back slightly, and your head is not tipped forward. The following are general guidelines for applying improved posture and body mechanics to your everyday activities. Sitting  1. When sitting, keep your spine neutral and keep your feet flat on the floor. Use a footrest, if necessary, and keep your thighs parallel to the floor. Avoid rounding your shoulders, and avoid tilting your head forward. 2. When working at a desk or a computer, keep your desk at a height where your hands are slightly lower than your elbows. Slide your chair under your desk so you are close enough to maintain good posture. 3. When working at a computer, place your monitor at a height where you are looking straight ahead and you do not have to tilt your head forward or downward to look at the screen. Standing   When standing, keep your spine neutral and keep your feet about hip-width apart. Keep a slight bend in your knees. Your ears, shoulders, and hips should line up.  When you do a task in which you stand in one place for a long time, place one foot up on a stable object that is 2-4 inches (5-10 cm) high, such as a footstool. This helps keep your spine neutral. Resting When lying down and resting, avoid positions that are most painful for you. Try to support your neck in a neutral position. You can use a contour pillow or a small rolled-up towel. Your pillow should support your neck but not push on it. This information is not intended to replace advice given to you by your health care provider. Make sure you discuss any questions you have with your health care provider. Document Revised: 06/18/2018 Document Reviewed: 11/27/2017 Elsevier Patient Education  [employer].

## 2019-12-23 NOTE — Progress Notes (Addendum)
10/13/202111:06 AM  Paula [name] Jun 27, 1970, 49 y.o., female [id number]  Chief Complaint  Patient presents with  . Torticollis    patient states the tightness in the back of head and neck is no better. Xray was normal. relaxer only help a little tbut then stiffness comes back. Physical therapy is not helping. Would like a referral to a specialist    HPI:   49 year old lady with no significant PmHX who is here complaining of pain in the back of her neck and head. Hurts the worst when rotating her head to the left. There is no radiation of the pain.  She has been having this off and on for a long time.  It gets pretty bad at times.  No associated dizziness or nausea or vomiting or headaches.  No radicular pain.  The patient grew up in [place], coming to the [place] about 20 years ago.  She is married and has 3 upper teenage children.  Has never had any major trauma to her neck or head.  When she was young and gone she lives with an aunt and did have to do some African daily chores such as carrying water on her head at times, but that was not a huge or regular task for her.  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 Disc spaces and vertebral body heights are preserved. Cervical disc and neural foraminal pathology may be further evaluated by MRI if indicated by localizing signs and symptoms.  Prior Visit 11/26/19 diclofenac 75 mg 1 twice daily for a couple of weeks for pain and inflammation in the neck.  Take with food.  Do not take Advil or Aleve (ibuprofen or naproxen) when on this medication.  methocarbamol muscle relaxant 500 mg 1 in the morning, 1 in the afternoon, and 1 at bedtime.  If it causes you drowsiness you can decrease the daytime once to 1/2 pill if necessary.  acetaminophen (Tylenol, paracetamol) 500 mg 2 pills maximum of 3 times daily  (do not exceed 3000 mg in 24 hours) for more severe pain.  Resume your physical therapy.   Depression screen [facility] 2/9 12/23/2019 11/26/2019 05/22/2019  Decreased Interest 0 0 0  Down, Depressed, Hopeless 0 0 0  PHQ - 2 Score 0 0 0    Fall Risk  12/23/2019 11/26/2019 05/22/2019 05/06/2019 08/18/2018  Falls in the past year? 0 0 0 0 0  Number falls in past yr: 0 0 0 0 0  Injury with Fall? 0 0 0 0 0  Risk for fall due to : - No Fall Risks - - -  Follow up Falls evaluation completed Falls evaluation completed Falls evaluation completed Falls evaluation completed Falls evaluation completed     No Known Allergies  Prior to Admission medications   Medication Sig Start Date End Date Taking? Authorizing Provider  cyclobenzaprine (FLEXERIL) 5 MG tablet Take 1 tablet (5 mg total) by mouth 3 (three) times daily as needed for muscle spasms. 08/18/18  Yes Forrest Moron, MD  methocarbamol (ROBAXIN) 500 MG tablet Take 1 tablet (500 mg total) by mouth 3 (three) times daily. 11/26/19  Yes Posey Boyer, MD    Past Medical History:  Diagnosis Date  . Allergy   . Anemia     Past Surgical History:  Procedure Laterality Date  . ABDOMINAL HYSTERECTOMY  11/26/2016  Procedure: HYSTERECTOMY ABDOMINAL;  Surgeon: Marylynn Pearson, MD;  Location: [facility];  Service: Gynecology;;  . NO PAST SURGERIES      Social History   Tobacco Use  . Smoking status: Never Smoker  . Smokeless tobacco: Never Used  Substance Use Topics  . Alcohol use: No    Family History  Problem Relation Age of Onset  . Hypertension Mother   . Hypertension Sister     Review of Systems  Constitutional: Negative for malaise/fatigue.  HENT: Negative for hearing loss.   Eyes: Negative for blurred vision and double vision.  Respiratory: Negative for cough, shortness of breath and wheezing.   Cardiovascular: Negative for chest pain, palpitations and leg swelling.  Gastrointestinal: Negative for abdominal pain, blood in stool,  constipation, diarrhea, heartburn, nausea and vomiting.  Genitourinary: Negative for dysuria, frequency and hematuria.  Musculoskeletal: Positive for neck pain. Negative for back pain and joint pain.  Neurological: Negative for dizziness, tingling, tremors, sensory change, focal weakness, weakness and headaches.   OBJECTIVE:  Today's Vitals   12/23/19 0958  BP: 130/80  Pulse: 70  Temp: 97.9 F (36.6 C)  TempSrc: Temporal  SpO2: 98%  Weight: 146 lb 6.4 oz (66.4 kg)  Height: 5\' 2"  (1.575 m)   Body mass index is 26.78 kg/m.   Physical Exam Constitutional:      General: She is not in acute distress.    Appearance: Normal appearance. She is not ill-appearing.  HENT:     Head: Normocephalic.  Cardiovascular:     Rate and Rhythm: Normal rate and regular rhythm.     Pulses: Normal pulses.     Heart sounds: Normal heart sounds. No murmur heard.  No friction rub. No gallop.   Pulmonary:     Effort: Pulmonary effort is normal. No respiratory distress.     Breath sounds: Normal breath sounds. No stridor. No wheezing, rhonchi or rales.  Musculoskeletal:     Cervical back: Normal range of motion. No erythema, signs of trauma, rigidity or tenderness. Pain with movement present.  Skin:    General: Skin is warm and dry.  Neurological:     General: No focal deficit present.     Mental Status: She is alert and oriented to person, place, and time.     Motor: No weakness.  Psychiatric:        Mood and Affect: Mood normal.     No results found for this or any previous visit (from the past 24 hour(s)).  No results found.   ASSESSMENT and PLAN  Problem List Items Addressed This Visit          Need for prophylactic vaccination and inoculation against influenza    -  Primary   Relevant Orders   Flu Vaccine QUAD 6+ mos PF IM (Fluarix Quad PF)   Cervicalgia       Relevant Medications   diclofenac Sodium (VOLTAREN) 1 % GEL 4 times daily   meloxicam (MOBIC) 7.5 MG tablet daily    Other Relevant Orders   Ambulatory referral to Physical Therapy     I think this pain is all from the muscles and ligaments of the neck, and do not feel like additional scans or special testing or referrals are needed at this time.  If you are not improving please return.  However people that have these pains do tend to get them off and on and there is probably no 100% cure for them.  Continue doing the exercises that physical  therapy teaches you when you are at home.  Encouraged to see non-pharmacological and non-traditional options for pain management.  Return in about 3 months (around 03/24/2020) for Annual appointment.  Huston Foley Paula Puffenbarger, FNP-BC Primary [facility] at [place],  [address] Ph.  [phone] Fax [phone]  I have reviewed and agree with above documentation. Agustina Caroli, MD

## 2020-03-01 ENCOUNTER — Telehealth: Payer: Self-pay | Admitting: *Deleted

## 2020-03-01 ENCOUNTER — Other Ambulatory Visit: Payer: Self-pay

## 2020-03-01 ENCOUNTER — Ambulatory Visit: Payer: BC Managed Care – PPO | Admitting: Family Medicine

## 2020-03-01 ENCOUNTER — Encounter: Payer: Self-pay | Admitting: Family Medicine

## 2020-03-01 VITALS — BP 138/76 | HR 64 | Temp 98.0°F | Resp 18 | Ht 62.0 in | Wt 152.0 lb

## 2020-03-01 DIAGNOSIS — M542 Cervicalgia: Secondary | ICD-10-CM

## 2020-03-01 DIAGNOSIS — E2839 Other primary ovarian failure: Secondary | ICD-10-CM

## 2020-03-01 DIAGNOSIS — Z0001 Encounter for general adult medical examination with abnormal findings: Secondary | ICD-10-CM | POA: Diagnosis not present

## 2020-03-01 DIAGNOSIS — D649 Anemia, unspecified: Secondary | ICD-10-CM

## 2020-03-01 DIAGNOSIS — Z1211 Encounter for screening for malignant neoplasm of colon: Secondary | ICD-10-CM

## 2020-03-01 DIAGNOSIS — Z1231 Encounter for screening mammogram for malignant neoplasm of breast: Secondary | ICD-10-CM

## 2020-03-01 DIAGNOSIS — R7303 Prediabetes: Secondary | ICD-10-CM | POA: Diagnosis not present

## 2020-03-01 DIAGNOSIS — Z0000 Encounter for general adult medical examination without abnormal findings: Secondary | ICD-10-CM

## 2020-03-01 NOTE — Patient Instructions (Addendum)
Ortho will be contacting you to schedule an appointment about your neck pain  Gastroenterology will be calling you to schedule your colonoscopy  I will contact you tomorrow about your labs  Please schedule your pap smear at earliest Paula [name], Female Adopting a healthy lifestyle and getting preventive care are important in promoting health and wellness. Ask your health care provider about:  The right schedule for you to have regular tests and exams.  Things you can do on your own to prevent diseases and keep yourself healthy. What should I know about diet, weight, and exercise? Eat a healthy diet   Eat a diet that includes plenty of vegetables, fruits, low-fat dairy products, and lean protein.  Do not eat a lot of foods that are high in solid fats, added sugars, or sodium. Maintain a healthy weight Body mass index (BMI) is used to identify weight problems. It estimates body fat based on height and weight. Your health care provider can help determine your BMI and help you achieve or maintain a healthy weight. Get regular exercise Get regular exercise. This is one of the most important things you can do for your health. Most adults should:  Exercise for at least 150 minutes each week. The exercise should increase your heart rate and make you sweat (moderate-intensity exercise).  Do strengthening exercises at least twice a week. This is in addition to the moderate-intensity exercise.  Spend less time sitting. Even light physical activity can be beneficial. Watch cholesterol and blood lipids Have your blood tested for lipids and cholesterol at 47 years of age, then have this test every 5 years. Have your cholesterol levels checked more often if:  Your lipid or cholesterol levels are high.  You are older than 47 years of age.  You are at high risk for heart disease. What should I know about cancer screening? Depending on your health history and family  history, you may need to have cancer screening at various ages. This may include screening for:  Breast cancer.  Cervical cancer.  Colorectal cancer.  Skin cancer.  Lung cancer. What should I know about heart disease, diabetes, and high blood pressure? Blood pressure and heart disease  High blood pressure causes heart disease and increases the risk of stroke. This is more likely to develop in people who have high blood pressure readings, are of African descent, or are overweight.  Have your blood pressure checked: ? Every 3-5 years if you are 96-62 years of age. ? Every year if you are 13 years old or older. Diabetes Have regular diabetes screenings. This checks your fasting blood sugar level. Have the screening done:  Once every three years after age 74 if you are at a normal weight and have a low risk for diabetes.  More often and at a younger age if you are overweight or have a high risk for diabetes. What should I know about preventing infection? Hepatitis B If you have a higher risk for hepatitis B, you should be screened for this virus. Talk with your health care provider to find out if you are at risk for hepatitis B infection. Hepatitis C Testing is recommended for:  Everyone born from [birth year] through 1963.  Anyone with known risk factors for hepatitis C. Sexually transmitted infections (STIs)  Get screened for STIs, including gonorrhea and chlamydia, if: ? You are sexually active and are younger than 47 years of age. ? You are older than 47 years of age and  your health care provider tells you that you are at risk for this type of infection. ? Your sexual activity has changed since you were last screened, and you are at increased risk for chlamydia or gonorrhea. Ask your health care provider if you are at risk.  Ask your health care provider about whether you are at high risk for HIV. Your health care provider may recommend a prescription medicine to help prevent HIV  infection. If you choose to take medicine to prevent HIV, you should first get tested for HIV. You should then be tested every 3 months for as long as you are taking the medicine. Pregnancy  If you are about to stop having your period (premenopausal) and you may become pregnant, seek counseling before you get pregnant.  Take 400 to 800 micrograms (mcg) of folic acid every day if you become pregnant.  Ask for birth control (contraception) if you want to prevent pregnancy. Osteoporosis and menopause Osteoporosis is a disease in which the bones lose minerals and strength with aging. This can result in bone fractures. If you are 37 years old or older, or if you are at risk for osteoporosis and fractures, ask your health care provider if you should:  Be screened for bone loss.  Take a calcium or vitamin D supplement to lower your risk of fractures.  Be given hormone replacement therapy (HRT) to treat symptoms of menopause. Follow these instructions at home: Lifestyle  Do not use any products that contain nicotine or tobacco, such as cigarettes, e-cigarettes, and chewing tobacco. If you need help quitting, ask your health care provider.  Do not use street drugs.  Do not share needles.  Ask your health care provider for help if you need support or information about quitting drugs. Alcohol use  Do not drink alcohol if: ? Your health care provider tells you not to drink. ? You are pregnant, may be pregnant, or are planning to become pregnant.  If you drink alcohol: ? Limit how much you use to 0-1 drink a day. ? Limit intake if you are breastfeeding.  Be aware of how much alcohol is in your drink. In the [place] one drink equals one 12 oz bottle of beer (355 mL), one 5 oz glass of wine (148 mL), or one 1 oz glass of hard liquor (44 mL). General instructions  Schedule regular health, dental, and eye exams.  Stay current with your vaccines.  Tell your health care provider if: ? You  often feel depressed. ? You have ever been abused or do not feel safe at home. Summary  Adopting a healthy lifestyle and getting preventive care are important in promoting health and wellness.  Follow your health care provider's instructions about healthy diet, exercising, and getting tested or screened for diseases.  Follow your health care provider's instructions on monitoring your cholesterol and blood pressure. This information is not intended to replace advice given to you by your health care provider. Make sure you discuss any questions you have with your health care provider. Document Revised: 02/19/2018 Document Reviewed: 02/19/2018 Elsevier Patient Education  [employer].   If you have lab work done today you will be contacted with your lab results within the next 2 weeks.  If you have not heard from [place] then please contact us. The fastest way to get your results is to register for My Chart.   IF you received an x-ray today, you will receive an invoice from [facility]. Please contact [facility] at [phone] with questions or concerns regarding your invoice.   IF you received labwork today, you will receive an invoice from [facility]. Please contact [facility] at [phone] with questions or concerns regarding your invoice.   Our billing staff will not be able to assist you with questions regarding bills from these companies.  You will be contacted with the lab results as soon as they are available. The fastest way to get your results is to activate your My Chart account. Instructions are located on the last page of this paperwork. If you have not heard from [place] regarding the results in 2 weeks, please contact this office.

## 2020-03-01 NOTE — Telephone Encounter (Signed)
03/01/2020 - JULIE - THIS PATIENT HAD A PHYSICAL DONE WITH KELSEA JUST ON Tuesday (03/01/2020). KELSEA HAS REQUESTED SHE HAVE A MOBILE MAMMOGRAM. THE PATIENT'S BEST PHONE TO SCHEDULE IS: [phone]  Tracy

## 2020-03-01 NOTE — Progress Notes (Addendum)
12/21/20211:39 PM  Sheree Lalla 09/25/1970, 49 y.o., female [id number]  Chief Complaint  Patient presents with  . Transitions Of Care    Patient states she is here for TOC and a CPE. Patient states she has been having some neck pain so she would like a Referral      HPI:   Patient is a 49 y.o. female with past medical history significant for neck pain who presents today for annual physical   Mammogram: 09/22/15 (needs) PaP: 09/12/2012  (needs) Unsure of previous LMP: Hysterectomy due to fibroids Colon Cancer: referral placed to GI Vaccinations UTD Does not go to the eye doctor Sees Dentist q 6 months  Continues to have issues with neck pain and headaches Has not seen improvement with PT and chiropractor    Depression screen [facility] 2/9 03/01/2020 12/23/2019 11/26/2019  Decreased Interest 0 0 0  Down, Depressed, Hopeless 0 0 0  PHQ - 2 Score 0 0 0    Fall Risk  03/01/2020 12/23/2019 11/26/2019 05/22/2019 05/06/2019  Falls in the past year? 0 0 0 0 0  Number falls in past yr: 0 0 0 0 0  Injury with Fall? 0 0 0 0 0  Risk for fall due to : - - No Fall Risks - -  Follow up _0      No Known Allergies  Prior to Admission medications   Medication Sig Start Date End Date Taking? Authorizing Provider  diclofenac Sodium (VOLTAREN) 1 % GEL Apply 2 g topically 4 (four) times daily. 12/23/19   Kashena Novitski, Laurita Quint, FNP  meloxicam (MOBIC) 7.5 MG tablet Take 1 tablet (7.5 mg total) by mouth daily. For muscle pain 12/23/19   Jenilee Franey, Laurita Quint, FNP    Past Medical History:  Diagnosis Date  . Allergy   . Anemia     Past Surgical History:  Procedure Laterality Date  . ABDOMINAL HYSTERECTOMY  11/26/2016   Procedure: HYSTERECTOMY ABDOMINAL;  Surgeon: Marylynn Pearson, MD;  Location: [facility];  Service: Gynecology;;  . NO PAST SURGERIES      Social History    Tobacco Use  . Smoking status: Never Smoker  . Smokeless tobacco: Never Used  Substance Use Topics  . Alcohol use: No    Family History  Problem Relation Age of Onset  . Hypertension Mother   . Hypertension Sister     Review of Systems  Constitutional: Negative for chills, fever and malaise/fatigue.  HENT: Negative for hearing loss and tinnitus.   Eyes: Negative for blurred vision and double vision.  Respiratory: Negative for cough, shortness of breath and wheezing.   Cardiovascular: Negative for chest pain, palpitations and leg swelling.  Gastrointestinal: Negative for abdominal pain, blood in stool, constipation, diarrhea, heartburn, nausea and vomiting.  Genitourinary: Negative for dysuria, frequency and hematuria.  Musculoskeletal: Positive for neck pain. Negative for back pain and joint pain.  Skin: Negative for rash.  Neurological: Positive for headaches. Negative for dizziness and weakness.     OBJECTIVE:  Today's Vitals   03/01/20 1305  BP: 138/76  Pulse: 64  Resp: 18  Temp: 98 F (36.7 C)  TempSrc: Temporal  SpO2: 100%  Weight: 152 lb (68.9 kg)  Height: _1  (1.575 m)   Body mass index is 27.8 kg/m.   Physical Exam Vitals reviewed.  Constitutional:      Appearance: Normal appearance.  HENT:     Right Ear: Tympanic  membrane and ear canal normal.     Left Ear: Tympanic membrane and ear canal normal.     Nose: Nose normal.  Eyes:     Extraocular Movements: Extraocular movements intact.     Pupils: Pupils are equal, round, and reactive to light.  Neck:     Thyroid: No thyroid mass, thyromegaly or thyroid tenderness.  Cardiovascular:     Rate and Rhythm: Normal rate and regular rhythm.     Pulses: Normal pulses.     Heart sounds: Normal heart sounds.  Pulmonary:     Effort: Pulmonary effort is normal.     Breath sounds: Normal breath sounds.  Abdominal:     General: Bowel sounds are normal.     Palpations: Abdomen is soft.  Musculoskeletal:         General: Normal range of motion.     Cervical back: Normal range of motion. No rigidity. No pain with movement, spinous process tenderness or muscular tenderness.  Lymphadenopathy:     Cervical: No cervical adenopathy.  Skin:    General: Skin is warm and dry.     Capillary Refill: Capillary refill takes less than 2 seconds.  Neurological:     General: No focal deficit present.     Mental Status: She is alert and oriented to person, place, and time.  Psychiatric:        Mood and Affect: Mood normal.        Behavior: Behavior normal.     No results found for this or any previous visit (from the past 24 hour(s)).  No results found.   ASSESSMENT and PLAN  Problem List Items Addressed This Visit      Other   Cervicalgia   Relevant Orders   AMB referral to orthopedics  Have attempted to treat with conservative measures    Other Visit Diagnoses    Prediabetes    -  Primary   Relevant Orders   CMP14+EGFR   Hemoglobin A1c   Anemia, unspecified type       Relevant Orders   CBC with Differential/Platelet   Iron, TIBC and Ferritin Panel   Annual physical exam       Relevant Orders   Lipid Panel   Special screening for malignant neoplasms, colon       Relevant Orders   Ambulatory referral to Gastroenterology: colonoscopy   Estrogen deficiency       Relevant Orders   Vitamin D, 25-hydroxy   TSH   Encounter for screening mammogram for malignant neoplasm of breast       Relevant Orders   MS DIGITAL SCREENING TOMO BILATERAL  Needs to schedule Pap Will follow up with lab results      Return in about 4 months (around 06/30/2020).    Huston Foley Tema Alire, FNP-BC Primary Care at [place], [place] [address] Ph.  [phone] Fax [phone]  I have reviewed and agree with above documentation. Agustina Caroli, MD

## 2020-03-02 ENCOUNTER — Encounter: Payer: Self-pay | Admitting: Family Medicine

## 2020-03-02 DIAGNOSIS — R7303 Prediabetes: Secondary | ICD-10-CM | POA: Insufficient documentation

## 2020-03-02 DIAGNOSIS — E559 Vitamin D deficiency, unspecified: Secondary | ICD-10-CM | POA: Insufficient documentation

## 2020-03-02 LAB — IRON,TIBC AND FERRITIN PANEL
Ferritin: 58 ng/mL (ref 15–150)
Iron Saturation: 22 % (ref 15–55)
Iron: 81 ug/dL (ref 27–159)
Total Iron Binding Capacity: 372 ug/dL (ref 250–450)
UIBC: 291 ug/dL (ref 131–425)

## 2020-03-02 LAB — CBC WITH DIFFERENTIAL/PLATELET
Basophils Absolute: 0.1 10*3/uL (ref 0.0–0.2)
Basos: 1 %
EOS (ABSOLUTE): 0.1 10*3/uL (ref 0.0–0.4)
Eos: 3 %
Hematocrit: 42.8 % (ref 34.0–46.6)
Hemoglobin: 13.9 g/dL (ref 11.1–15.9)
Immature Grans (Abs): 0 10*3/uL (ref 0.0–0.1)
Immature Granulocytes: 0 %
Lymphocytes Absolute: 1.8 10*3/uL (ref 0.7–3.1)
Lymphs: 40 %
MCH: 27.9 pg (ref 26.6–33.0)
MCHC: 32.5 g/dL (ref 31.5–35.7)
MCV: 86 fL (ref 79–97)
Monocytes Absolute: 0.5 10*3/uL (ref 0.1–0.9)
Monocytes: 10 %
Neutrophils Absolute: 2.1 10*3/uL (ref 1.4–7.0)
Neutrophils: 46 %
Platelets: 242 10*3/uL (ref 150–450)
RBC: 4.98 x10E6/uL (ref 3.77–5.28)
RDW: 12.3 % (ref 11.7–15.4)
WBC: 4.5 10*3/uL (ref 3.4–10.8)

## 2020-03-02 LAB — CMP14+EGFR
ALT: 13 IU/L (ref 0–32)
AST: 20 IU/L (ref 0–40)
Albumin/Globulin Ratio: 1.7 (ref 1.2–2.2)
Albumin: 4.4 g/dL (ref 3.8–4.8)
Alkaline Phosphatase: 64 IU/L (ref 44–121)
BUN/Creatinine Ratio: 15 (ref 9–23)
BUN: 9 mg/dL (ref 6–24)
Bilirubin Total: 0.3 mg/dL (ref 0.0–1.2)
CO2: 24 mmol/L (ref 20–29)
Calcium: 9.5 mg/dL (ref 8.7–10.2)
Chloride: 103 mmol/L (ref 96–106)
Creatinine, Ser: 0.61 mg/dL (ref 0.57–1.00)
GFR calc Af Amer: 125 mL/min/{1.73_m2} (ref >59)
GFR calc non Af Amer: 108 mL/min/{1.73_m2} (ref >59)
Globulin, Total: 2.6 g/dL (ref 1.5–4.5)
Glucose: 77 mg/dL (ref 65–99)
Potassium: 4.6 mmol/L (ref 3.5–5.2)
Sodium: 140 mmol/L (ref 134–144)
Total Protein: 7 g/dL (ref 6.0–8.5)

## 2020-03-02 LAB — HEMOGLOBIN A1C
Est. average glucose Bld gHb Est-mCnc: 120 mg/dL
Hgb A1c MFr Bld: 5.8 % — ABNORMAL HIGH (ref 4.8–5.6)

## 2020-03-02 LAB — TSH: TSH: 1.93 u[IU]/mL (ref 0.450–4.500)

## 2020-03-02 LAB — LIPID PANEL
Chol/HDL Ratio: 2.8 ratio (ref 0.0–4.4)
Cholesterol, Total: 174 mg/dL (ref 100–199)
HDL: 63 mg/dL (ref >39)
LDL Chol Calc (NIH): 96 mg/dL (ref 0–99)
Triglycerides: 83 mg/dL (ref 0–149)
VLDL Cholesterol Cal: 15 mg/dL (ref 5–40)

## 2020-03-02 LAB — VITAMIN D 25 HYDROXY (VIT D DEFICIENCY, FRACTURES): Vit D, 25-Hydroxy: 15 ng/mL — ABNORMAL LOW (ref 30.0–100.0)

## 2020-03-02 NOTE — Progress Notes (Signed)
If you could let Paula [name] know her Vitamin D did come back quite low. I would recommend taking 2000 IU of Over the counter Vitamin D3 with food daily. We can recheck this at her next appointment. Her A1c was also slightly elevated.This places her in the pre-diabetes category. No medications are needed at this time, but continue to work on improving your diet and increasing exercise as able.

## 2020-03-02 NOTE — Telephone Encounter (Signed)
Patient is scheduled for 1-19 @ 11:50

## 2020-03-08 ENCOUNTER — Other Ambulatory Visit: Payer: Self-pay

## 2020-03-08 ENCOUNTER — Ambulatory Visit (INDEPENDENT_AMBULATORY_CARE_PROVIDER_SITE_OTHER): Payer: BC Managed Care – PPO | Admitting: Family Medicine

## 2020-03-08 ENCOUNTER — Other Ambulatory Visit (HOSPITAL_COMMUNITY)
Admission: RE | Admit: 2020-03-08 | Discharge: 2020-03-08 | Disposition: A | Discharge status: Home / Self Care | Payer: BC Managed Care – PPO | Source: Ambulatory Visit | Attending: Family Medicine | Admitting: Family Medicine

## 2020-03-08 ENCOUNTER — Encounter: Payer: Self-pay | Admitting: Family Medicine

## 2020-03-08 VITALS — BP 129/79 | HR 74 | Temp 98.2°F | Ht 62.0 in | Wt 151.0 lb

## 2020-03-08 DIAGNOSIS — Z113 Encounter for screening for infections with a predominantly sexual mode of transmission: Secondary | ICD-10-CM | POA: Diagnosis not present

## 2020-03-08 DIAGNOSIS — M542 Cervicalgia: Secondary | ICD-10-CM | POA: Diagnosis not present

## 2020-03-08 DIAGNOSIS — Z124 Encounter for screening for malignant neoplasm of cervix: Secondary | ICD-10-CM

## 2020-03-08 NOTE — Patient Instructions (Addendum)
If you have lab work done today you will be contacted with your lab results within the next 2 weeks.  If you have not heard from [place] then please contact us. The fastest way to get your results is to register for My Chart.   IF you received an x-ray today, you will receive an invoice from [facility]. Please contact [facility] at [phone] with questions or concerns regarding your invoice.   IF you received labwork today, you will receive an invoice from [facility]. Please contact [facility] at [phone] with questions or concerns regarding your invoice.   Our billing staff will not be able to assist you with questions regarding bills from these companies.  You will be contacted with the lab results as soon as they are available. The fastest way to get your results is to activate your My Chart account. Instructions are located on the last page of this paperwork. If you have not heard from [place] regarding the results in 2 weeks, please contact this office.      Pap Test Why am I having this test? A Pap test, also called a Pap smear, is a screening test to check for signs of:  Cancer of the vagina, cervix, and uterus. The cervix is the lower part of the uterus that opens into the vagina.  Infection.  Changes that may be a sign that cancer is developing (precancerous changes). Women need this test on a regular basis. In general, you should have a Pap test every 3 years until you reach menopause or age 74. Women aged 30-60 may choose to have their Pap test done at the same time as an HPV (human papillomavirus) test every 5 years (instead of every 3 years). Your health care provider may recommend having Pap tests more or less often depending on your medical conditions and past Pap test results. What kind of sample is taken?  Your health care provider will collect a sample of cells from the surface of your cervix. This will be done using a small cotton swab, plastic  spatula, or brush. This sample is often collected during a pelvic exam, when you are lying on your back on an exam table with feet in footrests (stirrups). In some cases, fluids (secretions) from the cervix or vagina may also be collected. How do I prepare for this test?  Be aware of where you are in your menstrual cycle. If you are menstruating on the day of the test, you may be asked to reschedule.  You may need to reschedule if you have a known vaginal infection on the day of the test.  Follow instructions from your health care provider about: ? Changing or stopping your regular medicines. Some medicines can cause abnormal test results, such as digitalis and tetracycline. ? Avoiding douching or taking a bath the day before or the day of the test. Tell a health care provider about:  Any allergies you have.  All medicines you are taking, including vitamins, herbs, eye drops, creams, and over-the-counter medicines.  Any blood disorders you have.  Any surgeries you have had.  Any medical conditions you have.  Whether you are pregnant or may be pregnant. How are the results reported? Your test results will be reported as either abnormal or normal. A false-positive result can occur. A false positive is incorrect because it means that a condition is present when it is not. A false-negative result can occur. A false negative is incorrect because it means  that a condition is not present when it is. What do the results mean? A normal test result means that you do not have signs of cancer of the vagina, cervix, or uterus. An abnormal result may mean that you have:  Cancer. A Pap test by itself is not enough to diagnose cancer. You will have more tests done in this case.  Precancerous changes in your vagina, cervix, or uterus.  Inflammation of the cervix.  An STD (sexually transmitted disease).  A fungal infection.  A parasite infection. Talk with your health care provider about  what your results mean. Questions to ask your health care provider Ask your health care provider, or the department that is doing the test:  When will my results be ready?  How will I get my results?  What are my treatment options?  What other tests do I need?  What are my next steps? Summary  In general, women should have a Pap test every 3 years until they reach menopause or age 47.  Your health care provider will collect a sample of cells from the surface of your cervix. This will be done using a small cotton swab, plastic spatula, or brush.  In some cases, fluids (secretions) from the cervix or vagina may also be collected. This information is not intended to replace advice given to you by your health care provider. Make sure you discuss any questions you have with your health care provider. Document Revised: 11/05/2016 Document Reviewed: 11/05/2016 Elsevier Patient Education  2020 [employer].

## 2020-03-08 NOTE — Progress Notes (Signed)
12/28/20219:59 AM  Paula [name] [birth year], 49 y.o., female [id number]  Chief Complaint  Patient presents with  . Gynecologic Exam    HPI:   Patient is a 49 y.o. female with past medical history significant for pre-diabetes, vitamin d deficiency who presents today for pap  Previous history of a hysterectomy No issues at this time Has not been called by ortho for a neck pain referral   Mammogram: 09/22/15 (sched for 03/30/20) PaP: 03/08/20 done today LMP: Hysterectomy due to fibroids Colon Cancer: referral placed to GI Vaccinations UTD   Vitamin D def Last vitamin D Lab Results  Component Value Date   VD25OH 15.0 (L) 03/01/2020   Pre-diabetes Lab Results  Component Value Date   HGBA1C 5.8 (H) 03/01/2020    Depression screen [facility] 2/9 03/08/2020 03/01/2020 12/23/2019  Decreased Interest 0 0 0  Down, Depressed, Hopeless 0 0 0  PHQ - 2 Score 0 0 0    Fall Risk  03/08/2020 03/01/2020 12/23/2019 11/26/2019 05/22/2019  Falls in the past year? 0 0 0 0 0  Number falls in past yr: 0 0 0 0 0  Injury with Fall? 0 0 0 0 0  Risk for fall due to : - - - No Fall Risks -  Follow up Falls evaluation completed Falls evaluation completed Falls evaluation completed Falls evaluation completed Falls evaluation completed     No Known Allergies  Prior to Admission medications   Medication Sig Start Date End Date Taking? Authorizing Provider  diclofenac Sodium (VOLTAREN) 1 % GEL Apply 2 g topically 4 (four) times daily. 12/23/19   Elester Apodaca, Azalee Course, FNP  meloxicam (MOBIC) 7.5 MG tablet Take 1 tablet (7.5 mg total) by mouth daily. For muscle pain 12/23/19   Juel Ripley, Azalee Course, FNP    Past Medical History:  Diagnosis Date  . Allergy   . Anemia     Past Surgical History:  Procedure Laterality Date  . ABDOMINAL HYSTERECTOMY  11/26/2016   Procedure: HYSTERECTOMY ABDOMINAL;  Surgeon: Zelphia Cairo, MD;  Location: [facility];  Service: Gynecology;;  . NO PAST SURGERIES      Social  History   Tobacco Use  . Smoking status: Never Smoker  . Smokeless tobacco: Never Used  Substance Use Topics  . Alcohol use: No    Family History  Problem Relation Age of Onset  . Hypertension Mother   . Hypertension Sister     Review of Systems  Respiratory: Negative for shortness of breath.   Cardiovascular: Negative for chest pain.  Gastrointestinal: Negative for abdominal pain, blood in stool, constipation, diarrhea, heartburn, nausea and vomiting.  Genitourinary: Negative for dysuria, flank pain, frequency, hematuria and urgency.  Musculoskeletal: Positive for neck pain.  Neurological: Positive for headaches. Negative for sensory change and weakness.     OBJECTIVE:  Today's Vitals   03/08/20 0904  BP: 129/79  Pulse: 74  Temp: 98.2 F (36.8 C)  SpO2: 99%  Weight: 151 lb (68.5 kg)  Height: 5\' 2"  (1.575 m)   Body mass index is 27.62 kg/m.   Physical Exam Constitutional:      General: She is not in acute distress.    Appearance: Normal appearance. She is not ill-appearing.  Eyes:     Conjunctiva/sclera: Conjunctivae normal.     Pupils: Pupils are equal, round, and reactive to light.  Pulmonary:     Effort: Pulmonary effort is normal. No respiratory distress.  Neurological:     Mental Status: She is alert and oriented  to person, place, and time.     Motor: No weakness.     Coordination: Coordination normal.     Gait: Gait normal.  Psychiatric:        Mood and Affect: Mood normal.   Pelvic exam: normal external genitalia, vulva, vagina, cervix, uterus and adnexa, VULVA: normal appearing vulva with no masses, tenderness or lesions, VAGINA: normal appearing vagina with normal color and discharge, no lesions, CERVIX: normal appearing cervix without discharge or lesions, ADNEXA:  nontender and no masses, no palpable internal organs, PAP: Pap smear done today, thin-prep method, DNA probe for chlamydia and GC obtained, HPV test, exam chaperoned by [facility],  LPN.   No results found for this or any previous visit (from the past 24 hour(s)).  No results found.   ASSESSMENT and PLAN  Problem List Items Addressed This Visit      Other   Cervicalgia   Relevant Orders   AMB referral to orthopedics    Other Visit Diagnoses    Encounter for Papanicolaou smear for cervical cancer screening    -  Primary   Relevant Orders   Cytology - PAP([place])     Will follow up with results.  Return in about 6 months (around 09/06/2020).    Huston Foley Jermaine Tholl, FNP-BC Primary Care at [facility] [address], [place] [address] Ph.  [phone] Fax [phone]

## 2020-03-09 LAB — CYTOLOGY - PAP
Adequacy: ABSENT
Chlamydia: NEGATIVE
Comment: NEGATIVE
Comment: NEGATIVE
Comment: NEGATIVE
Comment: NORMAL
Diagnosis: NEGATIVE
High risk HPV: NEGATIVE
Neisseria Gonorrhea: NEGATIVE
Trichomonas: NEGATIVE

## 2020-03-09 NOTE — Progress Notes (Signed)
If you could let Paula [name] know her pap came back negative for abnormalities. HPV and all STIs were negative. No screening needed for 5 years.

## 2020-03-14 ENCOUNTER — Telehealth: Payer: Self-pay | Admitting: Family Medicine

## 2020-03-14 ENCOUNTER — Encounter: Payer: Self-pay | Admitting: Radiology

## 2020-03-14 NOTE — Telephone Encounter (Signed)
Pt would like a cb concerning her most recent lab results. Please advise at [phone].

## 2020-03-14 NOTE — Telephone Encounter (Signed)
Patient has been informed of her normal pap results and recommendations.

## 2020-03-17 ENCOUNTER — Encounter: Payer: Self-pay | Admitting: Surgery

## 2020-03-17 ENCOUNTER — Ambulatory Visit (INDEPENDENT_AMBULATORY_CARE_PROVIDER_SITE_OTHER): Payer: BC Managed Care – PPO | Admitting: Surgery

## 2020-03-17 ENCOUNTER — Ambulatory Visit: Payer: Self-pay

## 2020-03-17 VITALS — BP 128/83 | HR 82 | Ht 62.0 in | Wt 151.0 lb

## 2020-03-17 DIAGNOSIS — M542 Cervicalgia: Secondary | ICD-10-CM | POA: Diagnosis not present

## 2020-03-17 MED ORDER — METHYLPREDNISOLONE 4 MG PO TABS: ORAL_TABLET | ORAL | 0 refills | Status: DC

## 2020-03-17 NOTE — Progress Notes (Signed)
Office Visit Note   Patient: Paula [name]           Date of Birth: 04-14-1970           [name]: [id number] Visit Date: 03/17/2020              Requested by: Tobie Poet, FNP [address] [place],  [place] [address] PCP: Just, Azalee Course, FNP   Assessment & Plan: Visit Diagnoses:  1. Neck pain   Headaches  Plan: Since patient has failed multiple modes of conservative treatment by her primary care physician office consisting of oral NSAIDs, Tylenol, muscle relaxers, formal PT, and chiropractic treatments I recommend that we get a cervical spine MRI to rule out HNP/stenosis.  Advised patient that I am not quite sure as to why study has not been done much sooner since she has been dealing with this problem for over a year.  I will have patient follow-up with me to discuss results and will make decision at that time as to whether or not she needs to see one of the spine surgeons that I work for.  If no significant findings on the MRI I will consider referring patient to neurology to get their input along with evaluating her for the headaches that she has been having. I sent in a prescription for Medrol Dosepak 6-day taper to her pharmacy.  All questions answered.  Patient understands the treatment plan for today and was greatly appreciative.  Follow-Up Instructions: Return in about 3 weeks (around 04/07/2020) for with Maryela Tapper review cervical mri scan.   Orders:  Orders Placed This Encounter  Procedures  . XR Cervical Spine 2 or 3 views  . MR Cervical Spine w/o contrast   No orders of the defined types were placed in this encounter.     Procedures: No procedures performed   Clinical Data: No additional findings.   Subjective: Chief Complaint  Patient presents with  . Neck - Pain    HPI 50 year old black female comes in today with complaints of chronic neck pain.  Patient states that she has had posterior neck pain and headaches for over a year.  Reviewed patient's chart and she was  seen by PCP February 2021 and had cervical spine x-rays performed.  That report showed no fracture or static subluxation of the cervical spine.  This spaces and vertebral body heights are preserved.  Cervical disc and neuroforaminal pathology may be further evaluated by MRI if indicated by localizing signs and symptoms.  Patient went on to have conservative treatment over the next several months with oral NSAIDs, muscle relaxers, formal PT.  Patient states that she also went to a chiropractor for a month without any improvement.  Patient has never had a cervical spine MRI after failing all conservative measures.  States that she continues have ongoing neck pain that radiates into the bilateral trapezius and shoulder blades.  This worse with neck extension and flexion.  She finds it very difficult to get comfortable at night when she is sleeping.  Denies upper extremity radiculopathy.  Headaches can also be associated with her neck pain.  States that she has never seen a neurologist or has had imaging of her head.  This problem is causing a significant negative impact on her quality of life. Review of Systems No current cardiac pulmonary GI GU issues  Objective: Vital Signs: BP 128/83   Pulse 82   Ht 5\' 2"  (1.575 m)   Wt 151 lb (68.5 kg)  LMP 11/26/2016   BMI 27.62 kg/m   Physical Exam Constitutional:      Appearance: Normal appearance.  HENT:     Head: Normocephalic.  Pulmonary:     Effort: No respiratory distress.  Musculoskeletal:     Comments: Gait is normal.  Some limitation with cervical spine range of motion with flexion/extension and lateral bending due to discomfort.  Mild bilateral brachial plexus tenderness.  She has moderate bilateral trapezius, posterior neck and scapular border tenderness.  Pain with right Spurling test.  Bilateral shoulder exam unremarkable.  Bilateral elbow exam unremarkable.  Neurologically intact.    Neurological:     General: No focal deficit present.      Mental Status: She is alert and oriented to person, place, and time.     Ortho Exam  Specialty Comments:  No specialty comments available.  Imaging: No results found.   PMFS History: Patient Active Problem List   Diagnosis Date Noted  . Pre-diabetes 03/02/2020  . Vitamin D deficiency 03/02/2020  . Cervicalgia 12/23/2019  . Fibroids 11/26/2016  . Amenorrhea 08/05/2012   Past Medical History:  Diagnosis Date  . Allergy   . Anemia     Family History  Problem Relation Age of Onset  . Hypertension Mother   . Hypertension Sister     Past Surgical History:  Procedure Laterality Date  . ABDOMINAL HYSTERECTOMY  11/26/2016   Procedure: HYSTERECTOMY ABDOMINAL;  Surgeon: Zelphia Cairo, MD;  Location: [facility];  Service: Gynecology;;  . NO PAST SURGERIES     Social History   Occupational History  . Occupation: [employer]    Employer: [employer]  Tobacco Use  . Smoking status: Never Smoker  . Smokeless tobacco: Never Used  Substance and Sexual Activity  . Alcohol use: No  . Drug use: No  . Sexual activity: Not Currently

## 2020-05-12 ENCOUNTER — Telehealth: Payer: Self-pay | Admitting: *Deleted

## 2020-05-12 DIAGNOSIS — T884XXA Failed or difficult intubation, initial encounter: Secondary | ICD-10-CM | POA: Insufficient documentation

## 2020-05-12 NOTE — Telephone Encounter (Signed)
Called pt- informed of need for OV per DR Ardis Hughs- I cancelled 3-14 PV and 3-28 [facility] colon   OV 4-12 at 230 pm with Dr Ardis Hughs- pt instructed to check in at 215 pm 3rd floor- she verified address with me today

## 2020-05-12 NOTE — Telephone Encounter (Signed)
Office visit first.  Thanks  

## 2020-05-12 NOTE — Telephone Encounter (Signed)
Dr Ardis Hughs,  Paula [name] spt is a direct for a screening colon- in her chart she has a documented difficult airway at her hysterectomy 11-26-2016  Grade View: Grade IV Tube type: Oral Tube size: 7.0 mm Number of attempts: 2 (View with MAC by CRNA and MDA, only epiglottis seen. Successful intubation via Bougie by MDA, fisrt attempt.) Airway Equipment and Method: Stylet and Bougie stylet Placement Confirmation: breath sounds checked- equal and bilateral and positive ETCO2 Secured at: 21 cm Tube secured with: Tape Dental Injury: Teeth and Oropharynx as per pre-operative assessment  Difficulty Due To: Difficult Airway- due to large tongue and Difficult Airway- due to anterior larynx  Do you want her to have an OV or direct at [facility]?  Please advise- Thanks Lelan Pons PV

## 2020-06-06 ENCOUNTER — Encounter: Payer: BC Managed Care – PPO | Admitting: Gastroenterology

## 2020-06-21 ENCOUNTER — Ambulatory Visit: Payer: BC Managed Care – PPO | Admitting: Gastroenterology

## 2020-06-21 ENCOUNTER — Other Ambulatory Visit: Payer: Self-pay

## 2020-06-21 ENCOUNTER — Encounter: Payer: Self-pay | Admitting: Gastroenterology

## 2020-06-21 VITALS — BP 142/88 | HR 90 | Ht 62.0 in | Wt 153.5 lb

## 2020-06-21 DIAGNOSIS — Z1211 Encounter for screening for malignant neoplasm of colon: Secondary | ICD-10-CM | POA: Diagnosis not present

## 2020-06-21 NOTE — Progress Notes (Signed)
HPI: This is a very pleasant 50 year old woman whom I am meeting for the first time today.  She was born,.  She was recommended to have colon cancer screening with a colonoscopy.  She has never had colon cancer screening before.  She has no issues with her bowels including no bleeding, no constipation, no significant abdominal pains, no diarrhea.  Her weight is overall stable.  Colon cancer does not run in her family.  She has documented difficult airway, September 2018.  See below  Old Data Reviewed: She had a difficult airway intubation September 2018 "due to large tongue and anterior larynx."  Blood work December 2021 showed normal CBC, normal iron studies, normal complete metabolic profile     Review of systems: Pertinent positive and negative review of systems were noted in the above HPI section. All other review negative.   Past Medical History:  Diagnosis Date  . Allergy   . Anemia     Past Surgical History:  Procedure Laterality Date  . ABDOMINAL HYSTERECTOMY  11/26/2016   Procedure: HYSTERECTOMY ABDOMINAL;  Surgeon: Marylynn Pearson, MD;  Location: [facility];  Service: Gynecology;;  . NO PAST SURGERIES      Current Outpatient Medications  Medication Sig Dispense Refill  . Multiple Vitamin (MULTIVITAMIN) tablet Take 1 tablet by mouth daily.     No current facility-administered medications for this visit.    Allergies as of 06/21/2020  . (No Known Allergies)    Family History  Problem Relation Age of Onset  . Hypertension Mother   . Hypertension Sister   . Colon cancer Neg Hx   . Esophageal cancer Neg Hx   . Ovarian cancer Neg Hx   . Pancreatic cancer Neg Hx   . Stomach cancer Neg Hx     Social History   Socioeconomic History  . Marital status: Single    Spouse name: Not on file  . Number of children: 3  . Years of education: 85  . Highest education level: Not on file  Occupational History  . Occupation: [employer]    Employer: [employer]  Tobacco Use  .  Smoking status: Never Smoker  . Smokeless tobacco: Never Used  Vaping Use  . Vaping Use: Never used  Substance and Sexual Activity  . Alcohol use: No  . Drug use: No  . Sexual activity: Not Currently  Other Topics Concern  . Not on file  Social History Narrative  . Not on file   Social Determinants of Health   Financial Resource Strain: Not on file  Food Insecurity: Not on file  Transportation Needs: Not on file  Physical Activity: Not on file  Stress: Not on file  Social Connections: Not on file  Intimate Partner Violence: Not on file     Physical Exam: BP (!) 142/88   Pulse 90   Ht 5\' 2"  (1.575 m)   Wt 153 lb 8 oz (69.6 kg)   LMP 11/26/2016   SpO2 96%   BMI 28.08 kg/m  Constitutional: generally well-appearing Psychiatric: alert and oriented x3 Eyes: extraocular movements intact Mouth: oral pharynx moist, no lesions Neck: supple no lymphadenopathy Cardiovascular: heart regular rate and rhythm Lungs: clear to auscultation bilaterally Abdomen: soft, nontender, nondistended, no obvious ascites, no peritoneal signs, normal bowel sounds Extremities: no lower extremity edema bilaterally Skin: no lesions on visible extremities   Assessment and plan: 50 y.o. female with routine risk for colon cancer, documented difficult airway  I recommended colonoscopy for colon cancer screening,  routine risk.  Because of her difficult airway it would certainly be safest if this be done in the hospital setting.  I see no reason for any further blood tests or imaging studies prior to then.  Please see the "Patient Instructions" section for addition details about the plan.   Owens Loffler, MD [facility] 06/21/2020, 2:37 PM  Cc: Just, Laurita Quint, FNP  Total time on date of encounter was 35 minutes (this included time spent preparing to see the patient reviewing records; obtaining and/or reviewing separately obtained history; performing a medically appropriate exam and/or  evaluation; counseling and educating the patient and family if present; ordering medications, tests or procedures if applicable; and documenting clinical information in the health record).

## 2020-06-21 NOTE — Patient Instructions (Signed)
If you are age 48 or younger, your body mass index should be between 19-25. Your Body mass index is 28.08 kg/m. If this is out of the aformentioned range listed, please consider follow up with your Primary Care Provider.   You have been scheduled for a colonoscopy. Please follow written instructions given to you at your visit today.  Please pick up your prep supplies at the pharmacy within the next 1-3 days. If you use inhalers (even only as needed), please bring them with you on the day of your procedure.  Due to recent changes in healthcare laws, you may see the results of your imaging and laboratory studies on MyChart before your provider has had a chance to review them.  We understand that in some cases there may be results that are confusing or concerning to you. Not all laboratory results come back in the same time frame and the provider may be waiting for multiple results in order to interpret others.  Please give [place] 48 hours in order for your provider to thoroughly review all the results before contacting the office for clarification of your results.   Thank you for entrusting me with your care and choosing [facility].  Dr Ardis Hughs

## 2020-06-29 ENCOUNTER — Telehealth: Payer: Self-pay

## 2020-06-29 NOTE — Telephone Encounter (Signed)
Pt would like to proceed with mri.

## 2020-06-29 NOTE — Telephone Encounter (Signed)
Order for MRI reopened, notes made, pt to call [facility] to schedule.

## 2020-06-30 ENCOUNTER — Ambulatory Visit: Payer: BC Managed Care – PPO | Admitting: Family Medicine

## 2020-07-12 ENCOUNTER — Ambulatory Visit
Admission: RE | Admit: 2020-07-12 | Discharge: 2020-07-12 | Disposition: A | Discharge status: Home / Self Care | Payer: BC Managed Care – PPO | Source: Ambulatory Visit | Attending: Surgery | Admitting: Surgery

## 2020-07-12 ENCOUNTER — Other Ambulatory Visit: Payer: Self-pay

## 2020-07-12 DIAGNOSIS — M542 Cervicalgia: Secondary | ICD-10-CM

## 2020-07-21 ENCOUNTER — Encounter: Payer: Self-pay | Admitting: Emergency Medicine

## 2020-07-21 ENCOUNTER — Ambulatory Visit
Admission: EM | Admit: 2020-07-21 | Discharge: 2020-07-21 | Disposition: A | Discharge status: Home / Self Care | Payer: BC Managed Care – PPO | Attending: Family Medicine | Admitting: Family Medicine

## 2020-07-21 ENCOUNTER — Ambulatory Visit (INDEPENDENT_AMBULATORY_CARE_PROVIDER_SITE_OTHER): Payer: BC Managed Care – PPO | Admitting: Surgery

## 2020-07-21 ENCOUNTER — Other Ambulatory Visit: Payer: Self-pay

## 2020-07-21 DIAGNOSIS — J329 Chronic sinusitis, unspecified: Secondary | ICD-10-CM | POA: Diagnosis not present

## 2020-07-21 DIAGNOSIS — M542 Cervicalgia: Secondary | ICD-10-CM

## 2020-07-21 MED ORDER — PREDNISONE 20 MG PO TABS: 40.0000 mg | ORAL_TABLET | Freq: Every day | ORAL | 0 refills | Status: DC

## 2020-07-21 MED ORDER — AMOXICILLIN 875 MG PO TABS: 875.0000 mg | ORAL_TABLET | Freq: Two times a day (BID) | ORAL | 0 refills | Status: AC

## 2020-07-21 NOTE — ED Triage Notes (Signed)
Pt here for sinus congestion and facial pain; pt has results from MRI with her

## 2020-07-21 NOTE — ED Provider Notes (Signed)
[place]   [id number] 07/21/20 Arrival Time: [place] PLAN:  1. Chronic congestion of paranasal sinus    MRI report reviewed. Sinus thickening.   Begin trial of: Meds ordered this encounter  Medications  . amoxicillin (AMOXIL) 875 MG tablet    Sig: Take 1 tablet (875 mg total) by mouth 2 (two) times daily for 10 days.    Dispense:  20 tablet    Refill:  0  . predniSONE (DELTASONE) 20 MG tablet    Sig: Take 2 tablets (40 mg total) by mouth daily.    Dispense:  10 tablet    Refill:  0   OTC symptom care as needed.  Recommend:  Follow-up Information    [facility].   Why: If worsening or failing to improve as anticipated. Contact information: [address] [place] [address] [phone]               Reviewed expectations re: course of current medical issues. Questions answered. Outlined signs and symptoms indicating need for more acute intervention. Patient verbalized understanding. After Visit Summary given.   SUBJECTIVE: History from: patient.  Paula [name] is a 50 y.o. female who reports long-standing sinus congestion. MRI recently that showed sinus thickening. Seasonal allergies: yes. History of frequent sinus infections: "I think so". No specific aggravating or alleviating factors reported.  Social History   Tobacco Use  Smoking Status Never Smoker  Smokeless Tobacco Never Used   OBJECTIVE:  Vitals:   07/21/20 1552  BP: 120/75  Pulse: (!) 55  Resp: 18  Temp: 98.4 F (36.9 C)  TempSrc: Oral  SpO2: 99%     General appearance: alert; no distress HEENT: nasal congestion; clear runny nose; throat irritation secondary to post-nasal drainage; bilateral maxillary tenderness to palpation; turbinates boggy Neck: supple without LAD; trachea midline Lungs: unlabored respirations, symmetrical air entry; cough: absent; no respiratory distress Skin: warm and dry Psychological:  alert and cooperative; normal mood and affect  No Known Allergies  Past Medical History:  Diagnosis Date  . Allergy   . Anemia    Family History  Problem Relation Age of Onset  . Hypertension Mother   . Hypertension Sister   . Colon cancer Neg Hx   . Esophageal cancer Neg Hx   . Ovarian cancer Neg Hx   . Pancreatic cancer Neg Hx   . Stomach cancer Neg Hx    Social History   Socioeconomic History  . Marital status: Single    Spouse name: Not on file  . Number of children: 3  . Years of education: 60  . Highest education level: Not on file  Occupational History  . Occupation: [employer]    Employer: [employer]  Tobacco Use  . Smoking status: Never Smoker  . Smokeless tobacco: Never Used  Vaping Use  . Vaping Use: Never used  Substance and Sexual Activity  . Alcohol use: No  . Drug use: No  . Sexual activity: Not Currently  Other Topics Concern  . Not on file  Social History Narrative  . Not on file   Social Determinants of Health   Financial Resource Strain: Not on file  Food Insecurity: Not on file  Transportation Needs: Not on file  Physical Activity: Not on file  Stress: Not on file  Social Connections: Not on file  Intimate Partner Violence: Not on file            Vanessa Kick, MD  07/21/20 1612  

## 2020-07-21 NOTE — Progress Notes (Signed)
Office Visit Note   Patient: Paula [name]           Date of Birth: 06-10-1970           [name]: [id number] Visit Date: 07/21/2020              Requested by: Just, Laurita Quint, FNP No address on file PCP: Just, Laurita Quint, FNP (Inactive)   Assessment & Plan: Visit Diagnoses:  1. Neck pain   2. Other sinusitis, unspecified chronicity     Plan: I did review cervical spine MRI report with patient.  Advised that she does not have anything that is requiring potential surgery.  I will refer her to Dr. Ernestina Patches to get his input.  Regards to her increased sinus pressure/pain patient was advised to go to an urgent care this afternoon to get treated.  I also gave patient a copy of her MRI report which does document mild scattered paranasal sinus mucosal thickening.  All questions answered.  Follow-Up Instructions: Return if symptoms worsen or fail to improve.   Orders:  Orders Placed This Encounter  Procedures  . Ambulatory referral to Physical Medicine Rehab   No orders of the defined types were placed in this encounter.     Procedures: No procedures performed   Clinical Data: No additional findings.   Subjective: Chief Complaint  Patient presents with  . Neck - Follow-up    MRI Review    HPI 50 year old female comes in for review of her cervical spine MRI scan that was done Jul 12, 2020.  Report showed:  CLINICAL DATA:  Neck pain. Neck pain, chronic, degenerative changes on x-ray; worsening neck pain and occipital headaches. Failed multiple modes of conservative treatment.  EXAM: MRI CERVICAL SPINE WITHOUT CONTRAST  TECHNIQUE: Multiplanar, multisequence MR imaging of the cervical spine was performed. No intravenous contrast was administered.  COMPARISON:  Radiographs of the cervical spine 03/17/2020.  FINDINGS: Alignment: Straightening of the expected cervical lordosis. No significant spondylolisthesis.  Vertebrae: Vertebral body height is maintained. No  significant marrow edema or focal suspicious osseous lesion.  Cord: No spinal cord signal abnormality is identified.  Posterior Fossa, vertebral arteries, paraspinal tissues: No abnormality is identified within included portions of the posterior fossa. Flow voids preserved within the imaged cervical vertebral arteries. Paraspinal soft tissues within normal limits. Mild scattered paranasal sinus mucosal thickening, incompletely imaged.  Disc levels:  There is mild multilevel disc desiccation. However, there is no significant disc height loss within the cervical spine.  C2-C3: No significant disc herniation or stenosis.  C3-C4: No significant disc herniation or stenosis.  C4-C5: No significant disc herniation or stenosis.  C5-C6: No significant disc herniation or stenosis.  C6-C7: Tiny central disc protrusion (series 9, image 23). No significant spinal canal or foraminal stenosis.  C7-T1: Tiny central disc protrusion (series 9, image 27). No significant spinal canal or foraminal stenosis.  IMPRESSION: Mild multilevel disc desiccation without significant disc height loss. Tiny central disc protrusions at C6-C7 and C7-T1. No significant spinal canal or foraminal stenosis.  Scattered paranasal sinus mucosal thickening, partially imaged.   Electronically Signed   By: Kellie Simmering DO   On: 07/12/2020 17:54  She continues have ongoing posterior neck pain that extends into the bilateral trapezius and scapula.  Patient also complaining of sinus pressure/pain.  She does not currently have a primary care physician and has an appointment next month to see one she is thinking that is a long time to wait to get her  sinuses evaluated.   Objective: Vital Signs: LMP 11/26/2016   Physical Exam Pleasant female alert and oriented in no acute stress.  Tender at the posterior neck that extends into the trapezius and medial scapular borders.  Positive left maxillary sinus  tenderness. Ortho Exam  Specialty Comments:  No specialty comments available.  Imaging: No results found.   PMFS History: Patient Active Problem List   Diagnosis Date Noted  . 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 Pre-diabetes 03/02/2020  . Vitamin D deficiency 03/02/2020  . Cervicalgia 12/23/2019  . Fibroids 11/26/2016  . Amenorrhea 08/05/2012   Past Medical History:  Diagnosis Date  . Allergy   . Anemia     Family History  Problem Relation Age of Onset  . Hypertension Mother   . Hypertension Sister   . Colon cancer Neg Hx   . Esophageal cancer Neg Hx   . Ovarian cancer Neg Hx   . Pancreatic cancer Neg Hx   . Stomach cancer Neg Hx     Past Surgical History:  Procedure Laterality Date  . ABDOMINAL HYSTERECTOMY  11/26/2016   Procedure: HYSTERECTOMY ABDOMINAL;  Surgeon: Marylynn Pearson, MD;  Location: [facility];  Service: Gynecology;;  . NO PAST SURGERIES     Social History   Occupational History  . Occupation: [employer]    Employer: [employer]  Tobacco Use  . Smoking status: Never Smoker  . Smokeless tobacco: Never Used  Vaping Use  . Vaping Use: Never used  Substance and Sexual Activity  . Alcohol use: No  . Drug use: No  . Sexual activity: Not Currently

## 2020-08-09 ENCOUNTER — Ambulatory Visit (INDEPENDENT_AMBULATORY_CARE_PROVIDER_SITE_OTHER): Payer: BC Managed Care – PPO | Admitting: Physical Medicine and Rehabilitation

## 2020-08-09 ENCOUNTER — Other Ambulatory Visit: Payer: Self-pay

## 2020-08-09 ENCOUNTER — Encounter: Payer: Self-pay | Admitting: Physical Medicine and Rehabilitation

## 2020-08-09 VITALS — BP 112/75 | HR 80

## 2020-08-09 DIAGNOSIS — M542 Cervicalgia: Secondary | ICD-10-CM

## 2020-08-09 DIAGNOSIS — R519 Headache, unspecified: Secondary | ICD-10-CM

## 2020-08-09 DIAGNOSIS — M7918 Myalgia, other site: Secondary | ICD-10-CM | POA: Diagnosis not present

## 2020-08-09 DIAGNOSIS — M47812 Spondylosis without myelopathy or radiculopathy, cervical region: Secondary | ICD-10-CM

## 2020-08-09 NOTE — Progress Notes (Signed)
Paula [name] - 50 y.o. female [name] [id number]  Date of birth: [birth year]  Office Visit Note: Visit Date: 08/09/2020 PCP: Just, Laurita Quint, FNP (Inactive) Referred by: Lanae Crumbly, PA-C  Subjective: Chief Complaint  Patient presents with  . Neck - Pain   HPI: Paula [name] is a 50 y.o. female who comes in today At the request of Benjiman Core, PA-C for evaluation management of chronic recalcitrant neck pain with pressure on both sides with some right shoulder pain and shoulder blade pain with referral up into the occiput of the head.  Patient tells me that she has had this neck pain and pain in the lower back for many years.  The worst pain now is the neck pain she points to the trapezius on both sides but also with referral pain up into the occiput that feels just like pressure.  No lancinating pain no pain shooting over the top of the head.  No history of migraine headaches.  Reviewing the notes from her family practitioners and visits this pain goes back as far as 2019.  No specific injury.  No radicular symptoms or paresthesias.  She initially was treated with anti-inflammatories and muscle relaxers including Flexeril and she would have off-and-on flareups of pain but continued chronic pain.  She is just very concerned we will try to figure out the source of the pain and wanting a cure.  She did see a chiropractor but cannot remember the name of who she saw.  She did attend 2 sessions of physical therapy at [facility] physical therapy at [facility] and it does sound like they did some type of dry needling.  Again she only attended 2 sessions of that because it did not help according to her.  Ultimately without getting relief she was referred to orthopedics and saw Benjiman Core, PA-C in our office and he obtained MRI of the cervical spine because of failure with other care.  MRI is reviewed today with pictures and imaging and spine models and is reviewed below in the report.  Really a fairly normal MRI  of the cervical spine with some bit of spondylosis particular at C4-5 with some left-sided facet arthropathy but no nerve compression no stenosis no disc herniations.  She reports no focal weakness no balance difficulties no other red flag complaints.  She has had no prior cervical spine surgery.  She rates her pain as a 5 out of 10.  Biggest concern is this tight feeling over the occiput of the head and bilateral neck.  Review of Systems  Constitutional: Negative for chills, fever, malaise/fatigue and weight loss.  HENT: Negative for hearing loss and sinus pain.   Eyes: Negative for blurred vision, double vision and photophobia.  Respiratory: Negative for cough and shortness of breath.   Cardiovascular: Negative for chest pain, palpitations and leg swelling.  Gastrointestinal: Negative for abdominal pain, nausea and vomiting.  Genitourinary: Negative for flank pain.  Musculoskeletal: Positive for back pain and neck pain. Negative for myalgias.  Skin: Negative for itching and rash.  Neurological: Negative for tremors, focal weakness and weakness.  Endo/Heme/Allergies: Negative.   Psychiatric/Behavioral: Negative for depression.  All other systems reviewed and are negative.  Otherwise per HPI.  Assessment & Plan: Visit Diagnoses:    ICD-10-CM   1. Cervicalgia  M54.2   2. Cervical spondylosis without myelopathy  M47.812   3. Myofascial pain syndrome  M79.18   4. Occipital headache  R51.9      Plan: Findings:  Chronic at least several year history of cervicalgia with referral pain to the occiput but without lancinating pain or pain over the top of the head.  This does not seem like classic occipital neuralgia although we cannot rule that out.  Cervical MRI is fairly benign.  Clinical exam is mostly consistent with myofascial trigger points.  She does have reproducible trigger points in the levator scapula trapezius and cervical paraspinal region.  She has failed all manner of conservative  care including 2 sessions of physical therapy and chiropractic care and medication management.  At this point I did complete trigger point injections with a small amount of cortisone and we will monitor her response.  I also gave her some information on strengthening of the cervical spine muscles as well as information on deep tissue massage.  Depending on how the trigger point injections go this could be something that we regroup with a physical therapist here in our office for dry needling or if I can do intermittent trigger point injections I am happy to do that if it helps.  Otherwise if there is no real relief I would look at referral to Dr. Sarina Ill for her occipital headache.  We had a long discussion for more than 20 minutes on myofascial pain and trigger points and their management.  Should continue with current medications.  Could consider something like a Cymbalta/duloxetine.    Meds & Orders: No orders of the defined types were placed in this encounter.   Orders Placed This Encounter  Procedures  . Trigger Point Inj    Follow-up: Return if symptoms worsen or fail to improve.   Procedures: Trigger Point Inj  Date/Time: 08/10/2020 5:35 AM Performed by: Magnus Sinning, MD Authorized by: Magnus Sinning, MD   Consent Given by:  Patient Site marked: the procedure site was marked   Timeout: prior to procedure the correct patient, procedure, and site was verified   Total # of Trigger Points:  3 or more Location: neck and back   Needle Size:  25 G Approach:  Dorsal Medications #1:  20 mg triamcinolone acetonide 40 MG/ML Medications #2:  3 mL lidocaine 1 % Additional Injections?: No   Patient tolerance:  Patient tolerated the procedure well with no immediate complications Comments: Trigger points palpated and injected with needling technique into the levator scapula bilaterally.  Also trapezius.        Clinical History: MRI CERVICAL SPINE WITHOUT  CONTRAST  TECHNIQUE: Multiplanar, multisequence MR imaging of the cervical spine was performed. No intravenous contrast was administered.  COMPARISON:  Radiographs of the cervical spine 03/17/2020.  FINDINGS: Alignment: Straightening of the expected cervical lordosis. No significant spondylolisthesis.  Vertebrae: Vertebral body height is maintained. No significant marrow edema or focal suspicious osseous lesion.  Cord: No spinal cord signal abnormality is identified.  Posterior Fossa, vertebral arteries, paraspinal tissues: No abnormality is identified within included portions of the posterior fossa. Flow voids preserved within the imaged cervical vertebral arteries. Paraspinal soft tissues within normal limits. Mild scattered paranasal sinus mucosal thickening, incompletely imaged.  Disc levels:  There is mild multilevel disc desiccation. However, there is no significant disc height loss within the cervical spine.  C2-C3: No significant disc herniation or stenosis.  C3-C4: No significant disc herniation or stenosis.  C4-C5: No significant disc herniation or stenosis.  C5-C6: No significant disc herniation or stenosis.  C6-C7: Tiny central disc protrusion (series 9, image 23). No significant spinal canal or foraminal stenosis.  C7-T1: Tiny central disc  protrusion (series 9, image 27). No significant spinal canal or foraminal stenosis.  IMPRESSION: Mild multilevel disc desiccation without significant disc height loss. Tiny central disc protrusions at C6-C7 and C7-T1. No significant spinal canal or foraminal stenosis.  Scattered paranasal sinus mucosal thickening, partially imaged.   Electronically Signed   By: Kellie Simmering DO   On: 07/12/2020 17:54   She reports that she has never smoked. She has never used smokeless tobacco.  Recent Labs    03/01/20 1425  HGBA1C 5.8*    Objective:  VS:  HT:    WT:   BMI:     BP:112/75  HR:80bpm   TEMP: ( )  RESP:  Physical Exam Vitals and nursing note reviewed.  Constitutional:      General: She is not in acute distress.    Appearance: Normal appearance. She is not ill-appearing.  HENT:     Head: Normocephalic and atraumatic.     Right Ear: External ear normal.     Left Ear: External ear normal.  Eyes:     Extraocular Movements: Extraocular movements intact.  Cardiovascular:     Rate and Rhythm: Normal rate.     Pulses: Normal pulses.  Musculoskeletal:        General: Tenderness present.     Cervical back: Normal range of motion. Tenderness present. No rigidity.     Right lower leg: No edema.     Left lower leg: No edema.     Comments: Patient has good strength in the upper extremities including 5 out of 5 strength in wrist extension long finger flexion and APB.  There is no atrophy of the hands intrinsically.  There is a negative Hoffmann's test.  She does have active trigger points in the levator scapula bilaterally rhomboid and trapezius.   Lymphadenopathy:     Cervical: No cervical adenopathy.  Skin:    Findings: No erythema, lesion or rash.  Neurological:     General: No focal deficit present.     Mental Status: She is alert and oriented to person, place, and time.     Cranial Nerves: No cranial nerve deficit.     Sensory: No sensory deficit.     Motor: No weakness or abnormal muscle tone.     Coordination: Coordination normal.     Gait: Gait normal.  Psychiatric:        Mood and Affect: Mood normal.        Behavior: Behavior normal.     Ortho Exam  Imaging: No results found.  Past Medical/Family/Surgical/Social History: Medications & Allergies reviewed per EMR, new medications updated. Patient Active Problem List   Diagnosis Date Noted  . 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 Pre-diabetes 03/02/2020  . Vitamin D deficiency 03/02/2020  . Cervicalgia 12/23/2019  . Fibroids 11/26/2016  . Amenorrhea 08/05/2012   Past Medical History:  Diagnosis Date  .  Allergy   . Anemia    Family History  Problem Relation Age of Onset  . Hypertension Mother   . Hypertension Sister   . Colon cancer Neg Hx   . Esophageal cancer Neg Hx   . Ovarian cancer Neg Hx   . Pancreatic cancer Neg Hx   . Stomach cancer Neg Hx    Past Surgical History:  Procedure Laterality Date  . ABDOMINAL HYSTERECTOMY  11/26/2016   Procedure: HYSTERECTOMY ABDOMINAL;  Surgeon: Marylynn Pearson, MD;  Location: [facility];  Service: Gynecology;;  . NO PAST SURGERIES     Social History  Occupational History  . Occupation: [employer]    Employer: [employer]  Tobacco Use  . Smoking status: Never Smoker  . Smokeless tobacco: Never Used  Vaping Use  . Vaping Use: Never used  Substance and Sexual Activity  . Alcohol use: No  . Drug use: No  . Sexual activity: Not Currently

## 2020-08-09 NOTE — Progress Notes (Signed)
[place], dry needling. Posterior and left sided neck pain. Pain in back of head. Right shoulder and arm pain. States the pain is more of a tight feeling. Numeric Pain Rating Scale and Functional Assessment Average Pain 5   In the last MONTH (on 0-10 scale) has pain interfered with the following?  1. General activity like being  able to carry out your everyday physical activities such as walking, climbing stairs, carrying groceries, or moving a chair?  Rating(4)

## 2020-08-10 ENCOUNTER — Encounter: Payer: Self-pay | Admitting: Physical Medicine and Rehabilitation

## 2020-08-10 DIAGNOSIS — R519 Headache, unspecified: Secondary | ICD-10-CM | POA: Diagnosis not present

## 2020-08-10 DIAGNOSIS — M542 Cervicalgia: Secondary | ICD-10-CM

## 2020-08-10 DIAGNOSIS — M47812 Spondylosis without myelopathy or radiculopathy, cervical region: Secondary | ICD-10-CM

## 2020-08-10 DIAGNOSIS — M7918 Myalgia, other site: Secondary | ICD-10-CM | POA: Diagnosis not present

## 2020-08-10 MED ORDER — TRIAMCINOLONE ACETONIDE 40 MG/ML IJ SUSP
20.0000 mg | INTRAMUSCULAR | Status: AC | PRN
  Administered 2020-08-10: 20 mg via INTRAMUSCULAR

## 2020-08-10 MED ORDER — LIDOCAINE HCL 1 % IJ SOLN
3.0000 mL | INTRAMUSCULAR | Status: AC | PRN
  Administered 2020-08-10: 3 mL

## 2020-08-10 NOTE — Progress Notes (Signed)
PT was driving when called to give preprocedure instructions for colonoscopy for 6/9/2.  Informed pt that I would call back at another time.

## 2020-08-15 ENCOUNTER — Telehealth: Payer: Self-pay | Admitting: Gastroenterology

## 2020-08-15 ENCOUNTER — Other Ambulatory Visit (HOSPITAL_COMMUNITY): Payer: BC Managed Care – PPO

## 2020-08-15 NOTE — Telephone Encounter (Signed)
Patient calling to reschedule colonoscopy scheduled for 08/18/20.

## 2020-08-15 NOTE — Telephone Encounter (Signed)
The pt has been rescheduled to 09/22/20 at 10 am. New instructions mailed.  The pt has been advised and instructed. She will call with any questions.

## 2020-09-02 ENCOUNTER — Ambulatory Visit: Payer: BC Managed Care – PPO | Admitting: Family Medicine

## 2020-09-02 ENCOUNTER — Encounter: Payer: Self-pay | Admitting: Family Medicine

## 2020-09-02 ENCOUNTER — Other Ambulatory Visit: Payer: Self-pay

## 2020-09-02 VITALS — BP 128/78 | HR 65 | Temp 98.2°F | Resp 16 | Ht 62.0 in | Wt 147.2 lb

## 2020-09-02 DIAGNOSIS — E559 Vitamin D deficiency, unspecified: Secondary | ICD-10-CM

## 2020-09-02 DIAGNOSIS — Z1159 Encounter for screening for other viral diseases: Secondary | ICD-10-CM | POA: Diagnosis not present

## 2020-09-02 DIAGNOSIS — Z114 Encounter for screening for human immunodeficiency virus [HIV]: Secondary | ICD-10-CM | POA: Diagnosis not present

## 2020-09-02 DIAGNOSIS — R7303 Prediabetes: Secondary | ICD-10-CM

## 2020-09-02 NOTE — Addendum Note (Signed)
Addended by: Patrcia Dolly on: 09/02/2020 12:00 PM   Modules accepted: Orders

## 2020-09-02 NOTE — Progress Notes (Signed)
Subjective:  Patient ID: Paula [name], female    DOB: 17-Sep-1970  Age: 50 y.o. [name]: [id number]  CC:  Chief Complaint  Patient presents with   Establish Care    Pt here to establish care, no concerns.     HPI Paula [name] presents for  Establish care.  Previous patient Paula [name] Just History of prediabetes, vitamin D deficiency.   Pap testing performed in December.   Mammogram - next month.  Referral placed to gastroenterology in December for colonoscopy - scheduled next month.   Followed by Dr. Ernestina Patches for neck pain.  Trigger point injection in May.  Prediabetes: Weight has improved. Improved diet and walking more for exercise.  No FH of diabetes.  Lab Results  Component Value Date   HGBA1C 5.8 (H) 03/01/2020   Wt Readings from Last 3 Encounters:  09/02/20 147 lb 3.2 oz (66.8 kg)  06/21/20 153 lb 8 oz (69.6 kg)  03/17/20 151 lb (68.5 kg)   Vitamin D deficiency Low reading in December, recommended 2000 individual units daily.  Taking 2000iu daily.   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   Pre-diabetes 03/02/2020   Vitamin D deficiency 03/02/2020   Cervicalgia 12/23/2019   Fibroids 11/26/2016   Amenorrhea 08/05/2012   Past Medical History:  Diagnosis Date   Allergy    Anemia    Past Surgical History:  Procedure Laterality Date   ABDOMINAL HYSTERECTOMY  11/26/2016   Procedure: HYSTERECTOMY ABDOMINAL;  Surgeon: Marylynn Pearson, MD;  Location: [facility];  Service: Gynecology;;   NO PAST SURGERIES     No Known Allergies Prior to Admission medications   Not on File   Social History   Socioeconomic History   Marital status: Single    Spouse name: Not on file   Number of children: 3   Years of education: 12   Highest education level: Not on file  Occupational History   Occupation: [employer]    Employer: [employer]  Tobacco Use    Smoking status: Never   Smokeless tobacco: Never  Vaping Use   Vaping Use: Never used  Substance and Sexual Activity   Alcohol use: No   Drug use: No   Sexual activity: Not Currently  Other Topics Concern   Not on file  Social History Narrative   Not on file   Social Determinants of Health   Financial Resource Strain: Not on file  Food Insecurity: Not on file  Transportation Needs: Not on file  Physical Activity: Not on file  Stress: Not on file  Social Connections: Not on file  Intimate Partner Violence: Not on file    Review of Systems No polyuria, polydipsia or blurry vision.   Objective:   Vitals:   09/02/20 1035  BP: 128/78  Pulse: 65  Resp: 16  Temp: 98.2 F (36.8 C)  TempSrc: Temporal  SpO2: 98%  Weight: 147 lb 3.2 oz (66.8 kg)  Height: 5\' 2"  (1.575 m)     Physical Exam Vitals reviewed.  Constitutional:      Appearance: Normal appearance. She is well-developed.  HENT:     Head: Normocephalic and atraumatic.  Eyes:     Conjunctiva/sclera: Conjunctivae normal.     Pupils: Pupils are equal, round, and reactive to light.  Neck:     Vascular: No carotid bruit.  Cardiovascular:  Rate and Rhythm: Normal rate and regular rhythm.     Heart sounds: Normal heart sounds.  Pulmonary:     Effort: Pulmonary effort is normal.     Breath sounds: Normal breath sounds.  Abdominal:     Palpations: Abdomen is soft. There is no pulsatile mass.     Tenderness: There is no abdominal tenderness.  Musculoskeletal:     Right lower leg: No edema.     Left lower leg: No edema.  Skin:    General: Skin is warm and dry.  Neurological:     Mental Status: She is alert and oriented to person, place, and time.  Psychiatric:        Mood and Affect: Mood normal.        Behavior: Behavior normal.       Assessment & Plan:  Paula [name] is a 50 y.o. female . Prediabetes - Plan: Hemoglobin A1c  -Commended on weight loss with dietary changes and increased  walking/exercise.  Anticipate improvements.  Check A1c.  46-month follow-up for physical.  Vitamin D deficiency - Plan: Vitamin D (25 hydroxy) -Check readings on current 2000 units/day supplement.  May need higher strength.  Need for hepatitis C screening test - Plan: Hepatitis C antibody  Screening for HIV (human immunodeficiency virus) - Plan: HIV Antibody (routine testing w rflx)  Keep follow-up as planned for colonoscopy and mammogram.  No orders of the defined types were placed in this encounter.  Patient Instructions  I will check vitamin D level again.  If it is still low, I can prescribe a higher dose prescription strength vitamin D.  Keep up the good work with diet and exercise!  I suspect your prediabetes test will be normal.  Follow-up in 6 months for a physical but please let me know if there are questions sooner.  Take care.    Signed,   Merri Ray, MD [place] 09/02/20 11:15 AM

## 2020-09-02 NOTE — Patient Instructions (Signed)
I will check vitamin D level again.  If it is still low, I can prescribe a higher dose prescription strength vitamin D.  Keep up the good work with diet and exercise!  I suspect your prediabetes test will be normal.  Follow-up in 6 months for a physical but please let me know if there are questions sooner.  Take care.

## 2020-09-16 ENCOUNTER — Other Ambulatory Visit: Payer: Self-pay

## 2020-09-16 ENCOUNTER — Other Ambulatory Visit: Payer: BC Managed Care – PPO

## 2020-09-19 ENCOUNTER — Other Ambulatory Visit (INDEPENDENT_AMBULATORY_CARE_PROVIDER_SITE_OTHER): Payer: BC Managed Care – PPO

## 2020-09-19 ENCOUNTER — Other Ambulatory Visit: Payer: Self-pay

## 2020-09-19 DIAGNOSIS — E559 Vitamin D deficiency, unspecified: Secondary | ICD-10-CM

## 2020-09-19 DIAGNOSIS — R7303 Prediabetes: Secondary | ICD-10-CM

## 2020-09-19 DIAGNOSIS — Z114 Encounter for screening for human immunodeficiency virus [HIV]: Secondary | ICD-10-CM

## 2020-09-19 DIAGNOSIS — Z1159 Encounter for screening for other viral diseases: Secondary | ICD-10-CM

## 2020-09-20 LAB — HIV ANTIBODY (ROUTINE TESTING W REFLEX): HIV 1&2 Ab, 4th Generation: NONREACTIVE

## 2020-09-20 LAB — EXTRA SPECIMEN

## 2020-09-20 LAB — HEMOGLOBIN A1C: Hgb A1c MFr Bld: 5.9 % (ref 4.6–6.5)

## 2020-09-20 LAB — VITAMIN D 25 HYDROXY (VIT D DEFICIENCY, FRACTURES): VITD: 35.24 ng/mL (ref 30.00–100.00)

## 2020-09-20 LAB — HEPATITIS C ANTIBODY
Hepatitis C Ab: NONREACTIVE
SIGNAL TO CUT-OFF: 0.01 (ref <1.00)

## 2020-09-22 ENCOUNTER — Ambulatory Visit (HOSPITAL_COMMUNITY): Payer: BC Managed Care – PPO | Admitting: Anesthesiology

## 2020-09-22 ENCOUNTER — Other Ambulatory Visit: Payer: Self-pay

## 2020-09-22 ENCOUNTER — Ambulatory Visit (HOSPITAL_COMMUNITY)
Admission: RE | Admit: 2020-09-22 | Discharge: 2020-09-22 | Disposition: A | Discharge status: Home / Self Care | Payer: BC Managed Care – PPO | Attending: Gastroenterology | Admitting: Gastroenterology

## 2020-09-22 ENCOUNTER — Encounter (HOSPITAL_COMMUNITY): Payer: Self-pay | Admitting: Gastroenterology

## 2020-09-22 ENCOUNTER — Encounter (HOSPITAL_COMMUNITY)
Admission: RE | Disposition: A | Discharge status: Home / Self Care | Payer: Self-pay | Source: Home / Self Care | Attending: Gastroenterology

## 2020-09-22 DIAGNOSIS — D122 Benign neoplasm of ascending colon: Secondary | ICD-10-CM | POA: Diagnosis not present

## 2020-09-22 DIAGNOSIS — Z1211 Encounter for screening for malignant neoplasm of colon: Secondary | ICD-10-CM | POA: Diagnosis present

## 2020-09-22 DIAGNOSIS — K635 Polyp of colon: Secondary | ICD-10-CM | POA: Diagnosis not present

## 2020-09-22 DIAGNOSIS — D123 Benign neoplasm of transverse colon: Secondary | ICD-10-CM | POA: Diagnosis not present

## 2020-09-22 HISTORY — PX: POLYPECTOMY: SHX5525

## 2020-09-22 HISTORY — PX: COLONOSCOPY WITH PROPOFOL: SHX5780

## 2020-09-22 SURGERY — COLONOSCOPY WITH PROPOFOL
Anesthesia: Monitor Anesthesia Care

## 2020-09-22 MED ORDER — LACTATED RINGERS IV SOLN: INTRAVENOUS | Status: DC | PRN

## 2020-09-22 MED ORDER — PROPOFOL 10 MG/ML IV BOLUS
INTRAVENOUS | Status: AC
  Filled 2020-09-22: qty 20

## 2020-09-22 MED ORDER — PROPOFOL 500 MG/50ML IV EMUL
INTRAVENOUS | Status: DC | PRN
  Administered 2020-09-22: 125 ug/kg/min via INTRAVENOUS

## 2020-09-22 MED ORDER — PROPOFOL 500 MG/50ML IV EMUL
INTRAVENOUS | Status: DC | PRN
  Administered 2020-09-22: 40 mg via INTRAVENOUS

## 2020-09-22 MED ORDER — PROPOFOL 1000 MG/100ML IV EMUL
INTRAVENOUS | Status: AC
  Filled 2020-09-22: qty 100

## 2020-09-22 MED ORDER — SODIUM CHLORIDE 0.9 % IV SOLN: INTRAVENOUS | Status: DC

## 2020-09-22 SURGICAL SUPPLY — 22 items

## 2020-09-22 NOTE — H&P (Signed)
  HPI: This is a woman here for CRC screening with colonoscopy   ROS: complete GI ROS as described in HPI, all other review negative.  Constitutional:  No unintentional weight loss   Past Medical History:  Diagnosis Date   Allergy    Anemia     Past Surgical History:  Procedure Laterality Date   ABDOMINAL HYSTERECTOMY  11/26/2016   Procedure: HYSTERECTOMY ABDOMINAL;  Surgeon: Marylynn Pearson, MD;  Location: [facility];  Service: Gynecology;;   NO PAST SURGERIES      Current Facility-Administered Medications  Medication Dose Route Frequency Provider Last Rate Last Admin   0.9 %  sodium chloride infusion   Intravenous Continuous Milus Banister, MD        Allergies as of 06/21/2020   (No Known Allergies)    Family History  Problem Relation Age of Onset   Hypertension Mother    Hypertension Sister    Colon cancer Neg Hx    Esophageal cancer Neg Hx    Ovarian cancer Neg Hx    Pancreatic cancer Neg Hx    Stomach cancer Neg Hx     Social History   Socioeconomic History   Marital status: Single    Spouse name: Not on file   Number of children: 3   Years of education: 12   Highest education level: Not on file  Occupational History   Occupation: [employer]    Employer: [employer]  Tobacco Use   Smoking status: Never   Smokeless tobacco: Never  Vaping Use   Vaping Use: Never used  Substance and Sexual Activity   Alcohol use: No   Drug use: No   Sexual activity: Not Currently  Other Topics Concern   Not on file  Social History Narrative   Not on file   Social Determinants of Health   Financial Resource Strain: Not on file  Food Insecurity: Not on file  Transportation Needs: Not on file  Physical Activity: Not on file  Stress: Not on file  Social Connections: Not on file  Intimate Partner Violence: Not on file     Physical Exam: BP 137/89   Pulse 63   Temp (!) 97.2 F (36.2 C) (Temporal)   Resp 17   Ht 5\' 2"  (1.575 m)   Wt 68 kg   LMP 11/26/2016   SpO2  100%   BMI 27.44 kg/m  Constitutional: generally well-appearing Psychiatric: alert and oriented x3 Abdomen: soft, nontender, nondistended, no obvious ascites, no peritoneal signs, normal bowel sounds No peripheral edema noted in lower extremities  Assessment and plan: 50 y.o. female at routine risk for CRC  Screening colonosdopy today  Please see the "Patient Instructions" section for addition details about the plan.  Owens Loffler, MD [facility] 09/22/2020, 8:49 AM

## 2020-09-22 NOTE — Op Note (Signed)
[facility] Patient Name: Paula [name] Procedure Date: 09/22/2020 [name]: [id number] Attending MD: Milus Banister , MD Date of Birth: [birth year] CSN: [id number] Age: 50 Admit Type: Outpatient Procedure:                Colonoscopy Indications:              Screening for colorectal malignant neoplasm Providers:                Milus Banister, MD, Glori Bickers, RN, Hinton Dyer,                            Ladona Ridgel, Technician, Maudry Diego, CRNA Referring MD:              Medicines:                Monitored Anesthesia Care Complications:            No immediate complications. Estimated blood loss:                            None. Estimated Blood Loss:     Estimated blood loss: none. Procedure:                Pre-Anesthesia Assessment:                           - Prior to the procedure, a History and Physical                            was performed, and patient medications and                            allergies were reviewed. The patient's tolerance of                            previous anesthesia was also reviewed. The risks                            and benefits of the procedure and the sedation                            options and risks were discussed with the patient.                            All questions were answered, and informed consent                            was obtained. Prior Anticoagulants: The patient has                            taken no previous anticoagulant or antiplatelet                            agents. ASA Grade Assessment: III - A patient with  severe systemic disease. After reviewing the risks                            and benefits, the patient was deemed in                            satisfactory condition to undergo the procedure.                           After obtaining informed consent, the colonoscope                            was passed under direct vision. Throughout the                             procedure, the patient's blood pressure, pulse, and                            oxygen saturations were monitored continuously. The                            CF-HQ190L ([id number]) Olympus colonoscope was                            introduced through the anus and advanced to the the                            cecum, identified by appendiceal orifice and                            ileocecal valve. The colonoscopy was performed                            without difficulty. The patient tolerated the                            procedure well. The quality of the bowel                            preparation was good. The ileocecal valve,                            appendiceal orifice, and rectum were photographed. Scope In: 9:21:13 AM Scope Out: 9:37:57 AM Scope Withdrawal Time: 0 hours 11 minutes 7 seconds  Total Procedure Duration: 0 hours 16 minutes 44 seconds  Findings:      Two sessile polyps were found in the transverse colon and ascending       colon. The polyps were 3 to 5 mm in size. These polyps were removed with       a cold snare. Resection and retrieval were complete.      The exam was otherwise without abnormality on direct and retroflexion       views. Impression:               - Two 3 to 5 mm polyps in  the transverse colon and                            in the ascending colon, removed with a cold snare.                            Resected and retrieved.                           - The examination was otherwise normal on direct                            and retroflexion views. Moderate Sedation:      Not Applicable - Patient had care per Anesthesia. Recommendation:           - Patient has a contact number available for                            emergencies. The signs and symptoms of potential                            delayed complications were discussed with the                            patient. Return to normal activities tomorrow.                             Written discharge instructions were provided to the                            patient.                           - Resume previous diet.                           - Continue present medications.                           - Await pathology results. Procedure Code(s):        --- Professional ---                           [phone], Colonoscopy, flexible; with removal of                            tumor(s), polyp(s), or other lesion(s) by snare                            technique Diagnosis Code(s):        --- Professional ---                           Z12.11, Encounter for screening for malignant                            neoplasm of  colon                           K63.5, Polyp of colon CPT copyright 2019 American Medical Association. All rights reserved. The codes documented in this report are preliminary and upon coder review may  be revised to meet current compliance requirements. Milus Banister, MD 09/22/2020 9:48:18 AM This report has been signed electronically. Number of Addenda: 0

## 2020-09-22 NOTE — Transfer of Care (Signed)
Immediate Anesthesia Transfer of Care Note  Patient: Paula [name]  Procedure(s) Performed: COLONOSCOPY WITH PROPOFOL POLYPECTOMY  Patient Location: PACU  Anesthesia Type:MAC  Level of Consciousness: awake  Airway & Oxygen Therapy: Patient Spontanous Breathing and Patient connected to face mask oxygen  Post-op Assessment: Report given to RN, Post -op Vital signs reviewed and stable and Patient moving all extremities X 4  Post vital signs: Reviewed and stable  Last Vitals:  Vitals Value Taken Time  BP 122/69 09/22/20 0942  Temp    Pulse 57 09/22/20 0943  Resp 18 09/22/20 0943  SpO2 100 % 09/22/20 0943  Vitals shown include unvalidated device data.  Last Pain:  Vitals:   09/22/20 0822  TempSrc: Temporal         Complications: No notable events documented.

## 2020-09-22 NOTE — Anesthesia Procedure Notes (Signed)
Procedure Name: MAC Date/Time: 09/22/2020 9:15 AM Performed by: Niel Hummer, CRNA Pre-anesthesia Checklist: Patient identified, Patient being monitored, Suction available and Emergency Drugs available Oxygen Delivery Method: Simple face mask

## 2020-09-22 NOTE — Anesthesia Preprocedure Evaluation (Addendum)
Anesthesia Evaluation    Reviewed: Allergy & Precautions, Patient's Chart, lab work & pertinent test results  History of Anesthesia Complications (+) DIFFICULT AIRWAY and history of anesthetic complications (1448: intubation w/ bougie, no attempt w/ video laryngoscopy )  Airway Mallampati: III  TM Distance: >3 FB Neck ROM: Full    Dental no notable dental hx. (+) Dental Advisory Given, Teeth Intact   Pulmonary neg pulmonary ROS,    Pulmonary exam normal breath sounds clear to auscultation       Cardiovascular negative cardio ROS Normal cardiovascular exam Rhythm:Regular Rate:Normal     Neuro/Psych negative neurological ROS  negative psych ROS   GI/Hepatic negative GI ROS, Neg liver ROS,   Endo/Other  negative endocrine ROS  Renal/GU negative Renal ROS  negative genitourinary   Musculoskeletal negative musculoskeletal ROS (+)   Abdominal   Peds  Hematology  (+) Blood dyscrasia, anemia ,   Anesthesia Other Findings CRC screen  Reproductive/Obstetrics negative OB ROS                            Anesthesia Physical Anesthesia Plan  ASA: 2  Anesthesia Plan: MAC   Post-op Pain Management:    Induction:   PONV Risk Score and Plan: 2 and Propofol infusion and TIVA  Airway Management Planned: Natural Airway and Simple Face Mask  Additional Equipment: None  Intra-op Plan:   Post-operative Plan:   Informed Consent: I have reviewed the patients History and Physical, chart, labs and discussed the procedure including the risks, benefits and alternatives for the proposed anesthesia with the patient or authorized representative who has indicated his/her understanding and acceptance.       Plan Discussed with: CRNA  Anesthesia Plan Comments: (Video laryngoscope available if needed)       Anesthesia Quick Evaluation

## 2020-09-22 NOTE — Anesthesia Postprocedure Evaluation (Signed)
Anesthesia Post Note  Patient: Paula [name]  Procedure(s) Performed: COLONOSCOPY WITH PROPOFOL POLYPECTOMY     Patient location during evaluation: PACU Anesthesia Type: MAC Level of consciousness: awake and alert Pain management: pain level controlled Vital Signs Assessment: post-procedure vital signs reviewed and stable Respiratory status: spontaneous breathing, nonlabored ventilation and respiratory function stable Cardiovascular status: blood pressure returned to baseline and stable Postop Assessment: no apparent nausea or vomiting Anesthetic complications: no   No notable events documented.  Last Vitals:  Vitals:   09/22/20 0950 09/22/20 1000  BP: 130/78 (!) 145/86  Pulse: (!) 58 (!) 58  Resp: 20 17  Temp:    SpO2: 100% 100%    Last Pain:  Vitals:   09/22/20 0950  TempSrc:   PainSc: 0-No pain                 Pervis Hocking

## 2020-09-22 NOTE — Discharge Instructions (Signed)
YOU HAD AN ENDOSCOPIC PROCEDURE TODAY: Refer to the procedure report and other information in the discharge instructions given to you for any specific questions about what was found during the examination. If this information does not answer your questions, please call [place] office at [phone] to clarify.  ° °YOU SHOULD EXPECT: Some feelings of bloating in the abdomen. Passage of more gas than usual. Walking can help get rid of the air that was put into your GI tract during the procedure and reduce the bloating. If you had a lower endoscopy (such as a colonoscopy or flexible sigmoidoscopy) you may notice spotting of blood in your stool or on the toilet paper. Some abdominal soreness may be present for a day or two, also. ° °DIET: Your first meal following the procedure should be a light meal and then it is ok to progress to your normal diet. A half-sandwich or bowl of soup is an example of a good first meal. Heavy or fried foods are harder to digest and may make you feel nauseous or bloated. Drink plenty of fluids but you should avoid alcoholic beverages for 24 hours. If you had a esophageal dilation, please see attached instructions for diet.   ° °ACTIVITY: Your care partner should take you home directly after the procedure. You should plan to take it easy, moving slowly for the rest of the day. You can resume normal activity the day after the procedure however YOU SHOULD NOT DRIVE, use power tools, machinery or perform tasks that involve climbing or major physical exertion for 24 hours (because of the sedation medicines used during the test).  ° °SYMPTOMS TO REPORT IMMEDIATELY: °A gastroenterologist can be reached at any hour. Please call [phone]  for any of the following symptoms:  °Following lower endoscopy (colonoscopy, flexible sigmoidoscopy) °Excessive amounts of blood in the stool  °Significant tenderness, worsening of abdominal pains  °Swelling of the abdomen that is new, acute  °Fever of 100° or  higher  °Following upper endoscopy (EGD, EUS, ERCP, esophageal dilation) °Vomiting of blood or coffee ground material  °New, significant abdominal pain  °New, significant chest pain or pain under the shoulder blades  °Painful or persistently difficult swallowing  °New shortness of breath  °Black, tarry-looking or red, bloody stools ° °FOLLOW UP:  °If any biopsies were taken you will be contacted by phone or by letter within the next 1-3 weeks. Call [phone]  if you have not heard about the biopsies in 3 weeks.  °Please also call with any specific questions about appointments or follow up tests. ° °

## 2020-09-23 LAB — SURGICAL PATHOLOGY

## 2021-02-13 ENCOUNTER — Other Ambulatory Visit: Payer: Self-pay | Admitting: *Deleted

## 2021-02-13 ENCOUNTER — Encounter: Payer: Self-pay | Admitting: *Deleted

## 2021-02-15 ENCOUNTER — Encounter: Payer: Self-pay | Admitting: Psychiatry

## 2021-02-15 ENCOUNTER — Ambulatory Visit: Payer: BC Managed Care – PPO | Admitting: Psychiatry

## 2021-02-15 VITALS — BP 122/82 | HR 76 | Ht 62.0 in | Wt 153.0 lb

## 2021-02-15 DIAGNOSIS — R51 Headache with orthostatic component, not elsewhere classified: Secondary | ICD-10-CM

## 2021-02-15 DIAGNOSIS — G44209 Tension-type headache, unspecified, not intractable: Secondary | ICD-10-CM
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
## 2021-02-15 NOTE — Patient Instructions (Signed)
Plan: CT scan of the head

## 2021-02-16 ENCOUNTER — Telehealth: Payer: Self-pay | Admitting: Psychiatry

## 2021-02-16 NOTE — Telephone Encounter (Signed)
[employer] Josem Kaufmann: [id number] (exp. 02/16/21 to 03/17/21) order sent to GI, they will reach out to the patient to schedule.

## 2021-03-08 ENCOUNTER — Emergency Department (HOSPITAL_COMMUNITY)
Admission: EM | Admit: 2021-03-08 | Discharge: 2021-03-08 | Disposition: A | Discharge status: Home / Self Care | Payer: BC Managed Care – PPO | Attending: Emergency Medicine | Admitting: Emergency Medicine

## 2021-03-08 ENCOUNTER — Emergency Department (HOSPITAL_COMMUNITY): Payer: BC Managed Care – PPO

## 2021-03-08 ENCOUNTER — Encounter: Payer: BC Managed Care – PPO | Admitting: Family Medicine

## 2021-03-08 ENCOUNTER — Other Ambulatory Visit: Payer: Self-pay

## 2021-03-08 DIAGNOSIS — M546 Pain in thoracic spine: Secondary | ICD-10-CM | POA: Diagnosis not present

## 2021-03-08 DIAGNOSIS — Y92410 Unspecified street and highway as the place of occurrence of the external cause: Secondary | ICD-10-CM | POA: Diagnosis not present

## 2021-03-08 DIAGNOSIS — R0789 Other chest pain: Secondary | ICD-10-CM | POA: Diagnosis present
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
## 2021-03-08 NOTE — Discharge Instructions (Addendum)
Please refer to the attached instructions. Tylenol or ibuprofen for discomfort. Follow-up with your primary care provider as needed.

## 2021-03-09 ENCOUNTER — Telehealth: Payer: Self-pay | Admitting: Family Medicine
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
## 2021-03-09 NOTE — Telephone Encounter (Signed)
Noted, she can reschedule at her convenience

## 2021-04-05 ENCOUNTER — Other Ambulatory Visit: Payer: Self-pay

## 2021-04-05 ENCOUNTER — Ambulatory Visit
Admission: RE | Admit: 2021-04-05 | Discharge: 2021-04-05 | Disposition: A | Discharge status: Home / Self Care | Payer: BC Managed Care – PPO | Source: Ambulatory Visit | Attending: Psychiatry | Admitting: Psychiatry

## 2021-04-05 DIAGNOSIS — R51 Headache with orthostatic component, not elsewhere classified: Secondary | ICD-10-CM

## 2021-05-04 ENCOUNTER — Encounter: Payer: Self-pay | Admitting: Family Medicine

## 2021-05-04 ENCOUNTER — Ambulatory Visit: Payer: BC Managed Care – PPO | Admitting: Family Medicine

## 2021-05-04 VITALS — BP 124/70 | HR 91 | Temp 97.9°F | Resp 16 | Ht 62.0 in | Wt 152.6 lb

## 2021-05-04 DIAGNOSIS — J309 Allergic rhinitis, unspecified: Secondary | ICD-10-CM

## 2021-05-04 DIAGNOSIS — Z23 Encounter for immunization: Secondary | ICD-10-CM | POA: Diagnosis not present

## 2021-05-04 DIAGNOSIS — G4700 Insomnia, unspecified: Secondary | ICD-10-CM | POA: Diagnosis not present

## 2021-05-04 DIAGNOSIS — M542 Cervicalgia: Secondary | ICD-10-CM | POA: Diagnosis not present

## 2021-05-04 DIAGNOSIS — G8929 Other chronic pain: Secondary | ICD-10-CM

## 2021-05-04 MED ORDER — FLUTICASONE PROPIONATE 50 MCG/ACT NA SUSP
2.0000 | Freq: Every day | NASAL | 6 refills | Status: DC
Start: 2021-05-04 — End: 2022-08-09

## 2021-05-04 MED ORDER — CYCLOBENZAPRINE HCL 5 MG PO TABS: 5.0000 mg | ORAL_TABLET | Freq: Three times a day (TID) | ORAL | 0 refills | Status: DC | PRN

## 2021-05-04 NOTE — Patient Instructions (Signed)
Current symptoms are likely due to allergies.  Restart fluticasone using the technique we discussed.  Okay to continue on over-the-counter antihistamine once per day.  See information below.  If symptoms or not improving follow-up and we can discuss other treatments.  Hope you feel better soon.  See information below on insomnia.  I think some of your sleep issues may be related to the neck pain.  I did temporarily prescribe a muscle relaxant to take at bedtime but please contact the neurologist as it appears physical therapy may be helpful for your neck and they may want to refer you.  Let me know if I can help further but follow-up if continued neck issues or medication refills needed so we can discuss plan further.  Insomnia Insomnia is a sleep disorder that makes it difficult to fall asleep or stay asleep. Insomnia can cause fatigue, low energy, difficulty concentrating, mood swings, and poor performance at work or school. There are three different ways to classify insomnia: Difficulty falling asleep. Difficulty staying asleep. Waking up too early in the morning. Any type of insomnia can be long-term (chronic) or short-term (acute). Both are common. Short-term insomnia usually lasts for three months or less. Chronic insomnia occurs at least three times a week for longer than three months. What are the causes? Insomnia may be caused by another condition, situation, or substance, such as: Anxiety. Certain medicines. Gastroesophageal reflux disease (GERD) or other gastrointestinal conditions. Asthma or other breathing conditions. Restless legs syndrome, sleep apnea, or other sleep disorders. Chronic pain. Menopause. Stroke. Abuse of alcohol, tobacco, or illegal drugs. Mental health conditions, such as depression. Caffeine. Neurological disorders, such as Alzheimer's disease. An overactive thyroid (hyperthyroidism). Sometimes, the cause of insomnia may not be known. What increases the  risk? Risk factors for insomnia include: Gender. Women are affected more often than men. Age. Insomnia is more common as you get older. Stress. Lack of exercise. Irregular work schedule or working night shifts. Traveling between different time zones. Certain medical and mental health conditions. What are the signs or symptoms? If you have insomnia, the main symptom is having trouble falling asleep or having trouble staying asleep. This may lead to other symptoms, such as: Feeling fatigued or having low energy. Feeling nervous about going to sleep. Not feeling rested in the morning. Having trouble concentrating. Feeling irritable, anxious, or depressed. How is this diagnosed? This condition may be diagnosed based on: Your symptoms and medical history. Your health care provider may ask about: Your sleep habits. Any medical conditions you have. Your mental health. A physical exam. How is this treated? Treatment for insomnia depends on the cause. Treatment may focus on treating an underlying condition that is causing insomnia. Treatment may also include: Medicines to help you sleep. Counseling or therapy. Lifestyle adjustments to help you sleep better. Follow these instructions at home: Eating and drinking  Limit or avoid alcohol, caffeinated beverages, and cigarettes, especially close to bedtime. These can disrupt your sleep. Do not eat a large meal or eat spicy foods right before bedtime. This can lead to digestive discomfort that can make it hard for you to sleep. Sleep habits  Keep a sleep diary to help you and your health care provider figure out what could be causing your insomnia. Write down: When you sleep. When you wake up during the night. How well you sleep. How rested you feel the next day. Any side effects of medicines you are taking. What you eat and drink. Make your bedroom  a dark, comfortable place where it is easy to fall asleep. Put up shades or blackout  curtains to block light from outside. Use a white noise machine to block noise. Keep the temperature cool. Limit screen use before bedtime. This includes: Watching TV. Using your smartphone, tablet, or computer. Stick to a routine that includes going to bed and waking up at the same times every day and night. This can help you fall asleep faster. Consider making a quiet activity, such as reading, part of your nighttime routine. Try to avoid taking naps during the day so that you sleep better at night. Get out of bed if you are still awake after 15 minutes of trying to sleep. Keep the lights down, but try reading or doing a quiet activity. When you feel sleepy, go back to bed. General instructions Take over-the-counter and prescription medicines only as told by your health care provider. Exercise regularly, as told by your health care provider. Avoid exercise starting several hours before bedtime. Use relaxation techniques to manage stress. Ask your health care provider to suggest some techniques that may work well for you. These may include: Breathing exercises. Routines to release muscle tension. Visualizing peaceful scenes. Make sure that you drive carefully. Avoid driving if you feel very sleepy. Keep all follow-up visits as told by your health care provider. This is important. Contact a health care provider if: You are tired throughout the day. You have trouble in your daily routine due to sleepiness. You continue to have sleep problems, or your sleep problems get worse. Get help right away if: You have serious thoughts about hurting yourself or someone else. If you ever feel like you may hurt yourself or others, or have thoughts about taking your own life, get help right away. You can go to your nearest emergency department or call: Your local emergency services (911 in the [place] A suicide crisis helpline, such as the [place] at 7876109635 or 988 in  the [place]. This is open 24 hours a day. Summary Insomnia is a sleep disorder that makes it difficult to fall asleep or stay asleep. Insomnia can be long-term (chronic) or short-term (acute). Treatment for insomnia depends on the cause. Treatment may focus on treating an underlying condition that is causing insomnia. Keep a sleep diary to help you and your health care provider figure out what could be causing your insomnia. This information is not intended to replace advice given to you by your health care provider. Make sure you discuss any questions you have with your health care provider. Document Revised: 09/21/2020 Document Reviewed: 01/07/2020 Elsevier Patient Education  2022 [employer].    Allergic Rhinitis, Adult Allergic rhinitis is an allergic reaction that affects the mucous membrane inside the nose. The mucous membrane is the tissue that produces mucus. There are two types of allergic rhinitis: Seasonal. This type is also called hay fever and happens only during certain seasons. Perennial. This type can happen at any time of the year. Allergic rhinitis cannot be spread from person to person. This condition can be mild, moderate, or severe. It can develop at any age and may be outgrown. What are the causes? This condition is caused by allergens. These are things that can cause an allergic reaction. Allergens may differ for seasonal allergic rhinitis and perennial allergic rhinitis. Seasonal allergic rhinitis is triggered by pollen. Pollen can come from grasses, trees, and weeds. Perennial allergic rhinitis may be triggered by: Dust mites. Proteins in a pet's  urine, saliva, or dander. Dander is dead skin cells from a pet. Smoke, mold, or car fumes. What increases the risk? You are more likely to develop this condition if you have a family history of allergies or other conditions related to allergies, including: Allergic conjunctivitis. This is inflammation of parts of the eyes  and eyelids. Asthma. This condition affects the lungs and makes it hard to breathe. Atopic dermatitis or eczema. This is long term (chronic) inflammation of the skin. Food allergies. What are the signs or symptoms? Symptoms of this condition include: Sneezing or coughing. A stuffy nose (nasal congestion), itchy nose, or nasal discharge. Itchy eyes and tearing of the eyes. A feeling of mucus dripping down the back of your throat (postnasal drip). Trouble sleeping. Tiredness or fatigue. Headache. Sore throat. How is this diagnosed? This condition may be diagnosed with your symptoms, medical history, and physical exam. Your health care provider may check for related conditions, such as: Asthma. Pink eye. This is eye inflammation caused by infection (conjunctivitis). Ear infection. Upper respiratory infection. This is an infection in the nose, throat, or upper airways. You may also have tests to find out which allergens trigger your symptoms. These may include skin tests or blood tests. How is this treated? There is no cure for this condition, but treatment can help control symptoms. Treatment may include: Taking medicines that block allergy symptoms, such as corticosteroids and antihistamines. Medicine may be given as a shot, nasal spray, or pill. Avoiding any allergens. Being exposed again and again to tiny amounts of allergens to help you build a defense against allergens (immunotherapy). This is done if other treatments have not helped. It may include: Allergy shots. These are injected medicines that have small amounts of allergen in them. Sublingual immunotherapy. This involves taking small doses of a medicine with allergen in it under your tongue. If these treatments do not work, your health care provider may prescribe newer, stronger medicines. Follow these instructions at home: Avoiding allergens Find out what you are allergic to and avoid those allergens. These are some things you  can do to help avoid allergens: If you have perennial allergies: Replace carpet with wood, tile, or vinyl flooring. Carpet can trap dander and dust. Do not smoke. Do not allow smoking in your home. Change your heating and air conditioning filters at least once a month. If you have seasonal allergies, take these steps during allergy season: Keep windows closed as much as possible. Plan outdoor activities when pollen counts are lowest. Check pollen counts before you plan outdoor activities. When coming indoors, change clothing and shower before sitting on furniture or bedding. If you have a pet in the house that produces allergens: Keep the pet out of the bedroom. Vacuum, sweep, and dust regularly. General instructions Take over-the-counter and prescription medicines only as told by your health care provider. Drink enough fluid to keep your urine pale yellow. Keep all follow-up visits as told by your health care provider. This is important. Where to find more information American Academy of [employer] Asthma & Immunology: www.aaaai.org Contact a health care provider if: You have a fever. You develop a cough that does not go away. You make whistling sounds when you breathe (wheeze). Your symptoms slow you down or stop you from doing your normal activities each day. Get help right away if: You have shortness of breath. This symptom may represent a serious problem that is an emergency. Do not wait to see if the symptom will go away.  Get medical help right away. Call your local emergency services (911 in the [place] Do not drive yourself to the hospital. Summary Allergic rhinitis may be managed by taking medicines as directed and avoiding allergens. If you have seasonal allergies, keep windows closed as much as possible during allergy season. Contact your health care provider if you develop a fever or a cough that does not go away. This information is not intended to replace advice given to you  by your health care provider. Make sure you discuss any questions you have with your health care provider. Document Revised: 04/17/2019 Document Reviewed: 02/24/2019 Elsevier Patient Education  2022 [employer].

## 2021-05-04 NOTE — Progress Notes (Signed)
Subjective:  Patient ID: Paula [name], female    DOB: 23-Feb-1971  Age: 51 y.o. [name]: [id number]  CC:  Chief Complaint  Patient presents with   Nasal Congestion    Has had congestion, eyes and throat itching, since Monday, has taken zyrtec but it has not helped    Depression    PHQ-2 score is a  4    HPI [name] presents for   Nasal congestion Past 3 days with some eye and throat itching.  Has tried Zyrtec past few days - slight improvement, then sx's return at night. Eye and throat itching. No cough, drinking fluids. No fever. No wheezing. No dyspnea, just nasal congestion.  Husband with similar symptoms. No known covid exposure.  Similar sx's every year in spring.  Prior flonase by ENT - out for 1+month.   Positive depression screening:  Denies depression - tired with congestion, but trouble sleeping for years.  Neck sore in past, tension HA in past.  Trouble getting to sleep, sometimes wakening 1 hour later, then sleeps through night.  Snoring - discussed with ENT. Sleep test in past was ok.  Tried melatonin - no relief.  Neck pain for years, Headache  for years. Seen by neuro.  MRI c-spine May 2022.  IMPRESSION: Mild multilevel disc desiccation without significant disc height loss. Tiny central disc protrusions at C6-C7 and C7-T1. No significant spinal canal or foraminal stenosis. Neuro Dr. Billey Gosling. Plan for 3 month follow up with neuro - possible PT for neck.  ENT, neuro notes reviewed  Depression screen [facility] 2/9 05/04/2021 09/02/2020 03/08/2020 03/01/2020 12/23/2019  Decreased Interest 0 0 0 0 0  Down, Depressed, Hopeless 0 0 0 0 0  PHQ - 2 Score 0 0 0 0 0  Altered sleeping 1 0 - - -  Tired, decreased energy 3 0 - - -  Change in appetite 0 0 - - -  Feeling bad or failure about yourself  0 0 - - -  Trouble concentrating 0 0 - - -  Moving slowly or fidgety/restless 0 0 - - -  Suicidal thoughts 0 0 - - -  PHQ-9 Score 4 0 - - -  Difficult doing work/chores Not  difficult at all - - - -     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   Pre-diabetes 03/02/2020   Vitamin D deficiency 03/02/2020   Cervicalgia 12/23/2019   Fibroids 11/26/2016   Amenorrhea 08/05/2012   Past Medical History:  Diagnosis Date   Allergy    Anemia    Past Surgical History:  Procedure Laterality Date   ABDOMINAL HYSTERECTOMY  11/26/2016   Procedure: HYSTERECTOMY ABDOMINAL;  Surgeon: Marylynn Pearson, MD;  Location: [facility];  Service: Gynecology;;   COLONOSCOPY WITH PROPOFOL N/A 09/22/2020   Procedure: COLONOSCOPY WITH PROPOFOL;  Surgeon: Milus Banister, MD;  Location: [facility];  Service: Endoscopy;  Laterality: N/A;   NO PAST SURGERIES     POLYPECTOMY  09/22/2020   Procedure: POLYPECTOMY;  Surgeon: Milus Banister, MD;  Location: [facility];  Service: Endoscopy;;   No Known Allergies Prior to Admission medications   Medication Sig Start Date End Date Taking? Authorizing Provider  Multiple Vitamin (MULTI-VITAMIN DAILY PO) Multi Vitamin   Yes [provider]   Social History   Socioeconomic History   Marital status: Single    Spouse name: Not on file   Number of children: 3   Years of  education: 12   Highest education level: Not on file  Occupational History   Occupation: [employer]    Employer: [employer]  Tobacco Use   Smoking status: Never   Smokeless tobacco: Never  Vaping Use   Vaping Use: Never used  Substance and Sexual Activity   Alcohol use: No   Drug use: No   Sexual activity: Not Currently  Other Topics Concern   Not on file  Social History Narrative   Right handed   Social Determinants of Health   Financial Resource Strain: Not on file  Food Insecurity: Not on file  Transportation Needs: Not on file  Physical Activity: Not on file  Stress: Not on file  Social Connections: Not on file  Intimate Partner Violence: Not on file    Review of Systems Per HPI  Objective:    Vitals:   05/04/21 1103  BP: 124/70  Pulse: 91  Resp: 16  Temp: 97.9 F (36.6 C)  TempSrc: Temporal  SpO2: 97%  Weight: 152 lb 9.6 oz (69.2 kg)  Height: 5\' 2"  (1.575 m)     Physical Exam Vitals reviewed.  Constitutional:      General: She is not in acute distress.    Appearance: She is well-developed.  HENT:     Head: Normocephalic and atraumatic.     Right Ear: Hearing, tympanic membrane, ear canal and external ear normal.     Left Ear: Hearing, tympanic membrane, ear canal and external ear normal.     Nose: Nose normal.     Mouth/Throat:     Pharynx: No posterior oropharyngeal erythema.  Eyes:     Conjunctiva/sclera: Conjunctivae normal.     Pupils: Pupils are equal, round, and reactive to light.  Cardiovascular:     Rate and Rhythm: Normal rate and regular rhythm.     Heart sounds: Normal heart sounds. No murmur heard. Pulmonary:     Effort: Pulmonary effort is normal. No respiratory distress.     Breath sounds: Normal breath sounds. No wheezing or rhonchi.  Musculoskeletal:     Comments: No midline bony tenderness to neck, describes area of discomfort at the paraspinal muscles with minimal spasm.  Skin:    General: Skin is warm and dry.     Findings: No rash.  Neurological:     Mental Status: She is alert and oriented to person, place, and time.  Psychiatric:        Mood and Affect: Mood normal.        Behavior: Behavior normal.       Assessment & Plan:  [name] is a 51 y.o. female . Allergic rhinitis, unspecified seasonality, unspecified trigger - Plan: fluticasone (FLONASE) 50 MCG/ACT nasal spray  - add flonase ns, continue antihistamine, trigger avoidance as possible with rtc precautions.   Insomnia, unspecified type Chronic neck pain - Plan: cyclobenzaprine (FLEXERIL) 5 MG tablet  - new concern with insomnia, suspect related to her chronic neck pain. Plan with neuro as above - possible neck PT. Temporary rx for flexeril with side effects  discussed. Handout given.    Needs flu shot - Plan: Flu Vaccine QUAD 22mo+IM (Fluarix, Fluzone & Alfiuria Quad PF)   Meds ordered this encounter  Medications   fluticasone (FLONASE) 50 MCG/ACT nasal spray    Sig: Place 2 sprays into both nostrils daily.    Dispense:  16 g    Refill:  6   cyclobenzaprine (FLEXERIL) 5 MG tablet    Sig: Take 1 tablet (5  mg total) by mouth 3 (three) times daily as needed for muscle spasms (start qhs prn due to sedation).    Dispense:  15 tablet    Refill:  0   Patient Instructions  Current symptoms are likely due to allergies.  Restart fluticasone using the technique we discussed.  Okay to continue on over-the-counter antihistamine once per day.  See information below.  If symptoms or not improving follow-up and we can discuss other treatments.  Hope you feel better soon.  See information below on insomnia.  I think some of your sleep issues may be related to the neck pain.  I did temporarily prescribe a muscle relaxant to take at bedtime but please contact the neurologist as it appears physical therapy may be helpful for your neck and they may want to refer you.  Let me know if I can help further but follow-up if continued neck issues or medication refills needed so we can discuss plan further.  Insomnia Insomnia is a sleep disorder that makes it difficult to fall asleep or stay asleep. Insomnia can cause fatigue, low energy, difficulty concentrating, mood swings, and poor performance at work or school. There are three different ways to classify insomnia: Difficulty falling asleep. Difficulty staying asleep. Waking up too early in the morning. Any type of insomnia can be long-term (chronic) or short-term (acute). Both are common. Short-term insomnia usually lasts for three months or less. Chronic insomnia occurs at least three times a week for longer than three months. What are the causes? Insomnia may be caused by another condition, situation, or substance,  such as: Anxiety. Certain medicines. Gastroesophageal reflux disease (GERD) or other gastrointestinal conditions. Asthma or other breathing conditions. Restless legs syndrome, sleep apnea, or other sleep disorders. Chronic pain. Menopause. Stroke. Abuse of alcohol, tobacco, or illegal drugs. Mental health conditions, such as depression. Caffeine. Neurological disorders, such as Alzheimer's disease. An overactive thyroid (hyperthyroidism). Sometimes, the cause of insomnia may not be known. What increases the risk? Risk factors for insomnia include: Gender. Women are affected more often than men. Age. Insomnia is more common as you get older. Stress. Lack of exercise. Irregular work schedule or working night shifts. Traveling between different time zones. Certain medical and mental health conditions. What are the signs or symptoms? If you have insomnia, the main symptom is having trouble falling asleep or having trouble staying asleep. This may lead to other symptoms, such as: Feeling fatigued or having low energy. Feeling nervous about going to sleep. Not feeling rested in the morning. Having trouble concentrating. Feeling irritable, anxious, or depressed. How is this diagnosed? This condition may be diagnosed based on: Your symptoms and medical history. Your health care provider may ask about: Your sleep habits. Any medical conditions you have. Your mental health. A physical exam. How is this treated? Treatment for insomnia depends on the cause. Treatment may focus on treating an underlying condition that is causing insomnia. Treatment may also include: Medicines to help you sleep. Counseling or therapy. Lifestyle adjustments to help you sleep better. Follow these instructions at home: Eating and drinking  Limit or avoid alcohol, caffeinated beverages, and cigarettes, especially close to bedtime. These can disrupt your sleep. Do not eat a large meal or eat spicy foods  right before bedtime. This can lead to digestive discomfort that can make it hard for you to sleep. Sleep habits  Keep a sleep diary to help you and your health care provider figure out what could be causing your insomnia. Write  down: When you sleep. When you wake up during the night. How well you sleep. How rested you feel the next day. Any side effects of medicines you are taking. What you eat and drink. Make your bedroom a dark, comfortable place where it is easy to fall asleep. Put up shades or blackout curtains to block light from outside. Use a white noise machine to block noise. Keep the temperature cool. Limit screen use before bedtime. This includes: Watching TV. Using your smartphone, tablet, or computer. Stick to a routine that includes going to bed and waking up at the same times every day and night. This can help you fall asleep faster. Consider making a quiet activity, such as reading, part of your nighttime routine. Try to avoid taking naps during the day so that you sleep better at night. Get out of bed if you are still awake after 15 minutes of trying to sleep. Keep the lights down, but try reading or doing a quiet activity. When you feel sleepy, go back to bed. General instructions Take over-the-counter and prescription medicines only as told by your health care provider. Exercise regularly, as told by your health care provider. Avoid exercise starting several hours before bedtime. Use relaxation techniques to manage stress. Ask your health care provider to suggest some techniques that may work well for you. These may include: Breathing exercises. Routines to release muscle tension. Visualizing peaceful scenes. Make sure that you drive carefully. Avoid driving if you feel very sleepy. Keep all follow-up visits as told by your health care provider. This is important. Contact a health care provider if: You are tired throughout the day. You have trouble in your daily  routine due to sleepiness. You continue to have sleep problems, or your sleep problems get worse. Get help right away if: You have serious thoughts about hurting yourself or someone else. If you ever feel like you may hurt yourself or others, or have thoughts about taking your own life, get help right away. You can go to your nearest emergency department or call: Your local emergency services (911 in the [place] A suicide crisis helpline, such as the [facility] at 870 311 9096 or 988 in the [place]. This is open 24 hours a day. Summary Insomnia is a sleep disorder that makes it difficult to fall asleep or stay asleep. Insomnia can be long-term (chronic) or short-term (acute). Treatment for insomnia depends on the cause. Treatment may focus on treating an underlying condition that is causing insomnia. Keep a sleep diary to help you and your health care provider figure out what could be causing your insomnia. This information is not intended to replace advice given to you by your health care provider. Make sure you discuss any questions you have with your health care provider. Document Revised: 09/21/2020 Document Reviewed: 01/07/2020 Elsevier Patient Education  2022 [place].    Allergic Rhinitis, Adult Allergic rhinitis is an allergic reaction that affects the mucous membrane inside the nose. The mucous membrane is the tissue that produces mucus. There are two types of allergic rhinitis: Seasonal. This type is also called hay fever and happens only during certain seasons. Perennial. This type can happen at any time of the year. Allergic rhinitis cannot be spread from person to person. This condition can be mild, moderate, or severe. It can develop at any age and may be outgrown. What are the causes? This condition is caused by allergens. These are things that can cause an allergic reaction. Allergens may  differ for seasonal allergic rhinitis and perennial allergic  rhinitis. Seasonal allergic rhinitis is triggered by pollen. Pollen can come from grasses, trees, and weeds. Perennial allergic rhinitis may be triggered by: Dust mites. Proteins in a pet's urine, saliva, or dander. Dander is dead skin cells from a pet. Smoke, mold, or car fumes. What increases the risk? You are more likely to develop this condition if you have a family history of allergies or other conditions related to allergies, including: Allergic conjunctivitis. This is inflammation of parts of the eyes and eyelids. Asthma. This condition affects the lungs and makes it hard to breathe. Atopic dermatitis or eczema. This is long term (chronic) inflammation of the skin. Food allergies. What are the signs or symptoms? Symptoms of this condition include: Sneezing or coughing. A stuffy nose (nasal congestion), itchy nose, or nasal discharge. Itchy eyes and tearing of the eyes. A feeling of mucus dripping down the back of your throat (postnasal drip). Trouble sleeping. Tiredness or fatigue. Headache. Sore throat. How is this diagnosed? This condition may be diagnosed with your symptoms, medical history, and physical exam. Your health care provider may check for related conditions, such as: Asthma. Pink eye. This is eye inflammation caused by infection (conjunctivitis). Ear infection. Upper respiratory infection. This is an infection in the nose, throat, or upper airways. You may also have tests to find out which allergens trigger your symptoms. These may include skin tests or blood tests. How is this treated? There is no cure for this condition, but treatment can help control symptoms. Treatment may include: Taking medicines that block allergy symptoms, such as corticosteroids and antihistamines. Medicine may be given as a shot, nasal spray, or pill. Avoiding any allergens. Being exposed again and again to tiny amounts of allergens to help you build a defense against allergens  (immunotherapy). This is done if other treatments have not helped. It may include: Allergy shots. These are injected medicines that have small amounts of allergen in them. Sublingual immunotherapy. This involves taking small doses of a medicine with allergen in it under your tongue. If these treatments do not work, your health care provider may prescribe newer, stronger medicines. Follow these instructions at home: Avoiding allergens Find out what you are allergic to and avoid those allergens. These are some things you can do to help avoid allergens: If you have perennial allergies: Replace carpet with wood, tile, or vinyl flooring. Carpet can trap dander and dust. Do not smoke. Do not allow smoking in your home. Change your heating and air conditioning filters at least once a month. If you have seasonal allergies, take these steps during allergy season: Keep windows closed as much as possible. Plan outdoor activities when pollen counts are lowest. Check pollen counts before you plan outdoor activities. When coming indoors, change clothing and shower before sitting on furniture or bedding. If you have a pet in the house that produces allergens: Keep the pet out of the bedroom. Vacuum, sweep, and dust regularly. General instructions Take over-the-counter and prescription medicines only as told by your health care provider. Drink enough fluid to keep your urine pale yellow. Keep all follow-up visits as told by your health care provider. This is important. Where to find more information American Academy of Allergy, Asthma & Immunology: www.aaaai.org Contact a health care provider if: You have a fever. You develop a cough that does not go away. You make whistling sounds when you breathe (wheeze). Your symptoms slow you down or stop you from doing  your normal activities each day. Get help right away if: You have shortness of breath. This symptom may represent a serious problem that is an  emergency. Do not wait to see if the symptom will go away. Get medical help right away. Call your local emergency services (911 in the [place] Do not drive yourself to the hospital. Summary Allergic rhinitis may be managed by taking medicines as directed and avoiding allergens. If you have seasonal allergies, keep windows closed as much as possible during allergy season. Contact your health care provider if you develop a fever or a cough that does not go away. This information is not intended to replace advice given to you by your health care provider. Make sure you discuss any questions you have with your health care provider. Document Revised: 04/17/2019 Document Reviewed: 02/24/2019 Elsevier Patient Education  2022 [place],   Merri Ray, MD [place] [employer] 05/04/21 12:04 PM

## 2021-05-23 ENCOUNTER — Ambulatory Visit: Payer: BC Managed Care – PPO | Admitting: Psychiatry

## 2021-06-15 ENCOUNTER — Ambulatory Visit (INDEPENDENT_AMBULATORY_CARE_PROVIDER_SITE_OTHER): Payer: BC Managed Care – PPO | Admitting: Family Medicine

## 2021-06-15 ENCOUNTER — Telehealth: Payer: Self-pay | Admitting: Psychiatry

## 2021-06-15 ENCOUNTER — Encounter: Payer: Self-pay | Admitting: Family Medicine

## 2021-06-15 VITALS — BP 122/70 | HR 83 | Temp 98.1°F | Resp 16 | Ht 62.0 in | Wt 153.8 lb

## 2021-06-15 DIAGNOSIS — R7303 Prediabetes: Secondary | ICD-10-CM | POA: Diagnosis not present

## 2021-06-15 DIAGNOSIS — Z13220 Encounter for screening for lipoid disorders: Secondary | ICD-10-CM | POA: Diagnosis not present

## 2021-06-15 DIAGNOSIS — D649 Anemia, unspecified: Secondary | ICD-10-CM | POA: Diagnosis not present

## 2021-06-15 DIAGNOSIS — R040 Epistaxis: Secondary | ICD-10-CM

## 2021-06-15 DIAGNOSIS — Z0000 Encounter for general adult medical examination without abnormal findings: Secondary | ICD-10-CM

## 2021-06-15 DIAGNOSIS — E559 Vitamin D deficiency, unspecified: Secondary | ICD-10-CM

## 2021-06-15 NOTE — Telephone Encounter (Signed)
LVM and sent mychart message asking pt to call us back and r/s 4/18 appointment- Dr. Billey Gosling out. ?

## 2021-06-15 NOTE — Progress Notes (Signed)
? ?Subjective:  ?Patient ID: Paula [name], female    DOB: May 24, 1970  Age: 51 y.o. [name]: [id number] ? ?CC:  ?Chief Complaint  ?Patient presents with  ? Annual Exam  ?  Pt here for annual exam, notes doing well   ? ? ?HPI ?[name] presents for Annual Exam ?Insomnia, chronic neck pain and allergies were discussed last visit in February. Having to reschedule appt with Dr. Billey Gosling, neuro.  ? ?Prediabetes: ?Exercise: walking 30-45 min 3-4 days per week.  ?Fast food - none.  ?No sweet tea or soda.  ?Lab Results  ?Component Value Date  ? HGBA1C 5.9 09/19/2020  ? ?Wt Readings from Last 3 Encounters:  ?06/15/21 153 lb 12.8 oz (69.8 kg)  ?05/04/21 152 lb 9.6 oz (69.2 kg)  ?03/08/21 154 lb 5.2 oz (70 kg)  ? ?Vitamin D deficiency ?Most recent testing 56 in July 2022. ?Low of 15 on 02/2020 ?Taking vit supplement otc - 2000iu per day.  ? ?All rhinitis: ?Flonase helps congestion. Itching in ears. Zyrtec once per day. Itchy throat at times. Clearing throat with blood in mucus past year at times, few times each week. Has seen ENT in past - no concerns, but did not discuss these sx's. Some bloody nose at times. No fever, night sweats, wt loss.  ? ? ?  06/15/2021  ?  3:01 PM 05/04/2021  ? 11:09 AM 09/02/2020  ? 10:37 AM 03/08/2020  ?  9:10 AM 03/01/2020  ?  1:06 PM  ?Depression screen PHQ 2/9  ?Decreased Interest 0 0 0 0 0  ?Down, Depressed, Hopeless 0 0 0 0 0  ?PHQ - 2 Score 0 0 0 0 0  ?Altered sleeping 1 1 0    ?Tired, decreased energy 0 3 0    ?Change in appetite 0 0 0    ?Feeling bad or failure about yourself  0 0 0    ?Trouble concentrating 0 0 0    ?Moving slowly or fidgety/restless 0 0 0    ?Suicidal thoughts 0 0 0    ?PHQ-9 Score 1 4 0    ?Difficult doing work/chores  Not difficult at all     ? ? ?Health Maintenance  ?Topic Date Due  ? INFLUENZA VACCINE  10/10/2021  ? PAP SMEAR-Modifier  03/09/2023  ? TETANUS/TDAP  01/29/2027  ? COLONOSCOPY (Pts 45-17yr Insurance coverage will need to be confirmed)  09/23/2030  ? Hepatitis C  Screening  Completed  ? HIV Screening  Completed  ? HPV VACCINES  Aged Out  ?Pap negative with negative HPV, chlamydia, gonorrhea, trichomonas on 03/08/2020 ?Colonoscopy last year.  ? ?Immunization History  ?Administered Date(s) Administered  ? Influenza,inj,Quad PF,6+ Mos 04/06/2016, 11/27/2016, 01/20/2018, 12/23/2019, 05/04/2021  ? Tdap 01/28/2017  ? ? ?No results found. ?No glasses/contacts. No prior optho eval.  ? ?Dental:Within Last 6 months - appt May 1st.  ? ?Alcohol: none ? ?Tobacco: none ? ?Exercise:walking as above.  ? ? ?History ?Patient Active Problem List  ? Diagnosis Date Noted  ? 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 Pre-diabetes 03/02/2020  ? Vitamin D deficiency 03/02/2020  ? Cervicalgia 12/23/2019  ? Fibroids 11/26/2016  ? Amenorrhea 08/05/2012  ? ?Past Medical History:  ?Diagnosis Date  ? Allergy   ? Anemia   ? ?Past Surgical History:  ?Procedure Laterality Date  ? ABDOMINAL HYSTERECTOMY  11/26/2016  ? Procedure: HYSTERECTOMY ABDOMINAL;  Surgeon: AMarylynn Pearson MD;  [facility];  Service: Gynecology;;  ? COLONOSCOPY WITH PROPOFOL N/A 09/22/2020  ? Procedure:  COLONOSCOPY WITH PROPOFOL;  Surgeon: Milus Banister, MD;  Location: [facility];  Service: Endoscopy;  Laterality: N/A;  ? NO PAST SURGERIES    ? POLYPECTOMY  09/22/2020  ? Procedure: POLYPECTOMY;  Surgeon: Milus Banister, MD;  Location: [facility];  Service: Endoscopy;;  ? ?No Known Allergies ?Prior to Admission medications   ?Medication Sig Start Date End Date Taking? Authorizing Provider  ?cyclobenzaprine (FLEXERIL) 5 MG tablet Take 1 tablet (5 mg total) by mouth 3 (three) times daily as needed for muscle spasms (start qhs prn due to sedation). 05/04/21  Yes Wendie Agreste, MD  ?fluticasone (FLONASE) 50 MCG/ACT nasal spray Place 2 sprays into both nostrils daily. 05/04/21  Yes Wendie Agreste, MD  ?Multiple Vitamin (MULTI-VITAMIN DAILY PO) Multi Vitamin   Yes [provider]  ? ?Social History  ? ?Socioeconomic History   ? Marital status: Single  ?  Spouse name: Not on file  ? Number of children: 3  ? Years of education: 24  ? Highest education level: Not on file  ?Occupational History  ? Occupation: [employer]  ?  Employer: [employer]  ?Tobacco Use  ? Smoking status: Never  ? Smokeless tobacco: Never  ?Vaping Use  ? Vaping Use: Never used  ?Substance and Sexual Activity  ? Alcohol use: No  ? Drug use: No  ? Sexual activity: Not Currently  ?Other Topics Concern  ? Not on file  ?Social History Narrative  ? Right handed  ? ?Social Determinants of Health  ? ?Financial Resource Strain: Not on file  ?Food Insecurity: Not on file  ?Transportation Needs: Not on file  ?Physical Activity: Not on file  ?Stress: Not on file  ?Social Connections: Not on file  ?Intimate Partner Violence: Not on file  ? ? ?Review of Systems ? ?13 point review of systems per patient health survey noted.  Negative other than as indicated above or in HPI.  ? ?Objective:  ? ?Vitals:  ? 06/15/21 1459  ?BP: 122/70  ?Pulse: 83  ?Resp: 16  ?Temp: 98.1 ?F (36.7 ?C)  ?TempSrc: Temporal  ?SpO2: 98%  ?Weight: 153 lb 12.8 oz (69.8 kg)  ?Height: '5\' 2"'$  (1.575 m)  ? ? ? ?Physical Exam ?Constitutional:   ?   Appearance: She is well-developed.  ?HENT:  ?   Head: Normocephalic and atraumatic.  ?   Right Ear: External ear normal.  ?   Left Ear: External ear normal.  ?   Nose:  ?   Comments: Slight irritated mucosa along the left nasal septum, small amount of dried blood. ?Eyes:  ?   Conjunctiva/sclera: Conjunctivae normal.  ?   Pupils: Pupils are equal, round, and reactive to light.  ?Neck:  ?   Thyroid: No thyromegaly.  ?Cardiovascular:  ?   Rate and Rhythm: Normal rate and regular rhythm.  ?   Heart sounds: Normal heart sounds. No murmur heard. ?Pulmonary:  ?   Effort: Pulmonary effort is normal. No respiratory distress.  ?   Breath sounds: Normal breath sounds. No wheezing.  ?Abdominal:  ?   General: Bowel sounds are normal.  ?   Palpations: Abdomen is soft.  ?   Tenderness: There is no  abdominal tenderness.  ?Musculoskeletal:     ?   General: No tenderness. Normal range of motion.  ?   Cervical back: Normal range of motion and neck supple.  ?Lymphadenopathy:  ?   Cervical: No cervical adenopathy.  ?Skin: ?   General: Skin is warm  and dry.  ?   Findings: No rash.  ?Neurological:  ?   Mental Status: She is alert and oriented to person, place, and time.  ?Psychiatric:     ?   Behavior: Behavior normal.     ?   Thought Content: Thought content normal.  ? ? ? ?Assessment & Plan:  ?Chanee Henrickson is a 51 y.o. female . ?Annual physical exam - Plan: CBC with Differential/Platelet, Comprehensive metabolic panel, Lipid panel, Hemoglobin A1c ? - -anticipatory guidance as below in AVS, screening labs above. Health maintenance items as above in HPI discussed/recommended as applicable.  ? ?Prediabetes - Plan: Comprehensive metabolic panel, Hemoglobin A1c ? - check labs, diet/exercise discussed.  ? ?Anemia, unspecified type - Plan: CBC with Differential/Platelet ? - screening lab with prior anemia.  ? ?Screening for hyperlipidemia - Plan: Lipid panel ? ?Vitamin D deficiency - Plan: Vitamin D (25 hydroxy) ? - repeat labs, continue otc supplement.  ? ?Nasal bleeding ? - raw area as above. Saline ns recommended, ENT follow up recommended. Continue allergy meds for now. Trigger avoidance.  RTC/ER precautions.  ? ?No orders of the defined types were placed in this encounter. ? ?Patient Instructions  ?Goal 121mn exercise per week.  ?Saline nasal spray to help prevent nosebleeds, but call your ENT doctor (Dr. SWest Carbo to discuss these symptoms and blood in mucus.  Okay to continue Flonase and Zyrtec right now for allergies.  See information below.  Try to avoid being outside on windy days during pollen season or wear a mask. ?Return to the clinic or go to the nearest emergency room if any of your symptoms worsen or new symptoms occur. ? ?I will check some screening labs.  Let me know if there are questions and take  care. ? ? ?Allergic Rhinitis, Adult ?Allergic rhinitis is an allergic reaction that affects the mucous membrane inside the nose. The mucous membrane is the tissue that produces mucus. ?There are two types

## 2021-06-15 NOTE — Patient Instructions (Addendum)
Goal 173mn exercise per week.  ?Saline nasal spray to help prevent nosebleeds, but call your ENT doctor (Dr. SWest Carbo to discuss these symptoms and blood in mucus.  Okay to continue Flonase and Zyrtec right now for allergies.  See information below.  Try to avoid being outside on windy days during pollen season or wear a mask. ?Return to the clinic or go to the nearest emergency room if any of your symptoms worsen or new symptoms occur. ? ?I will check some screening labs.  Let me know if there are questions and take care. ? ? ?Allergic Rhinitis, Adult ?Allergic rhinitis is an allergic reaction that affects the mucous membrane inside the nose. The mucous membrane is the tissue that produces mucus. ?There are two types of allergic rhinitis: ?Seasonal. This type is also called hay fever and happens only during certain seasons. ?Perennial. This type can happen at any time of the year. ?Allergic rhinitis cannot be spread from person to person. This condition can be mild, moderate, or severe. It can develop at any age and may be outgrown. ?What are the causes? ?This condition is caused by allergens. These are things that can cause an allergic reaction. Allergens may differ for seasonal allergic rhinitis and perennial allergic rhinitis. ?Seasonal allergic rhinitis is triggered by pollen. Pollen can come from grasses, trees, and weeds. ?Perennial allergic rhinitis may be triggered by: ?Dust mites. ?Proteins in a pet's urine, saliva, or dander. Dander is dead skin cells from a pet. ?Smoke, mold, or car fumes. ?What increases the risk? ?You are more likely to develop this condition if you have a family history of allergies or other conditions related to allergies, including: ?Allergic conjunctivitis. This is inflammation of parts of the eyes and eyelids. ?Asthma. This condition affects the lungs and makes it hard to breathe. ?Atopic dermatitis or eczema. This is long term (chronic) inflammation of the skin. ?Food  allergies. ?What are the signs or symptoms? ?Symptoms of this condition include: ?Sneezing or coughing. ?A stuffy nose (nasal congestion), itchy nose, or nasal discharge. ?Itchy eyes and tearing of the eyes. ?A feeling of mucus dripping down the back of your throat (postnasal drip). ?Trouble sleeping. ?Tiredness or fatigue. ?Headache. ?Sore throat. ?How is this diagnosed? ?This condition may be diagnosed with your symptoms, medical history, and physical exam. Your health care provider may check for related conditions, such as: ?Asthma. ?Pink eye. This is eye inflammation caused by infection (conjunctivitis). ?Ear infection. ?Upper respiratory infection. This is an infection in the nose, throat, or upper airways. ?You may also have tests to find out which allergens trigger your symptoms. These may include skin tests or blood tests. ?How is this treated? ?There is no cure for this condition, but treatment can help control symptoms. Treatment may include: ?Taking medicines that block allergy symptoms, such as corticosteroids and antihistamines. Medicine may be given as a shot, nasal spray, or pill. ?Avoiding any allergens. ?Being exposed again and again to tiny amounts of allergens to help you build a defense against allergens (immunotherapy). This is done if other treatments have not helped. It may include: ?Allergy shots. These are injected medicines that have small amounts of allergen in them. ?Sublingual immunotherapy. This involves taking small doses of a medicine with allergen in it under your tongue. ?If these treatments do not work, your health care provider may prescribe newer, stronger medicines. ?Follow these instructions at home: ?Avoiding allergens ?Find out what you are allergic to and avoid those allergens. These are some things you  can do to help avoid allergens: ?If you have perennial allergies: ?Replace carpet with wood, tile, or vinyl flooring. Carpet can trap dander and dust. ?Do not smoke. Do not  allow smoking in your home. ?Change your heating and air conditioning filters at least once a month. ?If you have seasonal allergies, take these steps during allergy season: ?Keep windows closed as much as possible. ?Plan outdoor activities when pollen counts are lowest. Check pollen counts before you plan outdoor activities. ?When coming indoors, change clothing and shower before sitting on furniture or bedding. ?If you have a pet in the house that produces allergens: ?Keep the pet out of the bedroom. ?Vacuum, sweep, and dust regularly. ?General instructions ?Take over-the-counter and prescription medicines only as told by your health care provider. ?Drink enough fluid to keep your urine pale yellow. ?Keep all follow-up visits as told by your health care provider. This is important. ?Where to find more information ?American Academy of Allergy, Asthma & Immunology: www.aaaai.org ?Contact a health care provider if: ?You have a fever. ?You develop a cough that does not go away. ?You make whistling sounds when you breathe (wheeze). ?Your symptoms slow you down or stop you from doing your normal activities each day. ?Get help right away if: ?You have shortness of breath. ?This symptom may represent a serious problem that is an emergency. Do not wait to see if the symptom will go away. Get medical help right away. Call your local emergency services (911 in the [place] Do not drive yourself to the hospital. ?Summary ?Allergic rhinitis may be managed by taking medicines as directed and avoiding allergens. ?If you have seasonal allergies, keep windows closed as much as possible during allergy season. ?Contact your health care provider if you develop a fever or a cough that does not go away. ?This information is not intended to replace advice given to you by your health care provider. Make sure you discuss any questions you have with your health care provider. ?Document Revised: 04/17/2019 Document Reviewed:  02/24/2019 ?Elsevier Patient Education ? [place]. ? ? ?Nosebleed, Adult ?A nosebleed is when blood comes out of the nose. Nosebleeds are common. Usually, they are not a sign of a serious condition. ?Nosebleeds can happen if a blood vessel in your nose starts to bleed or if the lining of your nose (mucous membrane) cracks. They are commonly caused by: ?Allergies. ?Colds. ?Picking your nose. ?Blowing your nose too hard. ?An injury from sticking an object into your nose or getting hit in the nose. ?Dry or cold air. ?Less common causes of nosebleeds include: ?Toxic fumes. ?Something abnormal in the nose or in the air-filled spaces in the bones of the face (sinuses). ?Growths in the nose, such as polyps. ?Blood thinners or conditions that cause blood to clot slowly. ?Certain illnesses or procedures that irritate or dry out the nasal passages. ?Follow these instructions at home: ?When you have a nosebleed: ? ?Sit down and tilt your head slightly forward. ?Use a clean towel or tissue to pinch your nostrils under the bony part of your nose. After 5 minutes, let go of your nose and see if bleeding starts again. Do not release pressure before that time. If there is still bleeding, repeat the pinching and holding for 5 minutes or until the bleeding stops. ?Do not place tissues or gauze in the nose to stop the bleeding. ?Avoid lying down and avoid tilting your head backward. That may make blood collect in the throat and cause gagging  or coughing. ?Use a nasal spray decongestant to help with a nosebleed as told by your health care provider. ?After a nosebleed: ?Avoid blowing your nose or sniffing for a number of hours. ?Avoid straining, lifting, or bending at the waist for several days. You may go back to other normal activities as you are able. ?If you are taking aspirin or blood thinners and you have nosebleeds, talk to your health care provider. These medicines make bleeding more likely. ?Ask your health care  provider if you should stop taking the medicines or if you should adjust the dose. ?Do not stop taking medicines that your health care provider has recommended unless he or she tells you to stop taking them. ?If your nosebleed was c

## 2021-06-20 ENCOUNTER — Telehealth: Payer: Self-pay

## 2021-06-20 ENCOUNTER — Other Ambulatory Visit: Payer: BC Managed Care – PPO

## 2021-06-20 DIAGNOSIS — R7303 Prediabetes: Secondary | ICD-10-CM

## 2021-06-20 DIAGNOSIS — E559 Vitamin D deficiency, unspecified: Secondary | ICD-10-CM

## 2021-06-20 DIAGNOSIS — D649 Anemia, unspecified: Secondary | ICD-10-CM

## 2021-06-20 DIAGNOSIS — Z0000 Encounter for general adult medical examination without abnormal findings: Secondary | ICD-10-CM

## 2021-06-20 DIAGNOSIS — Z13220 Encounter for screening for lipoid disorders: Secondary | ICD-10-CM

## 2021-06-20 NOTE — Telephone Encounter (Signed)
There was an issue and her tubes clotted and were unable to be run, will need to redraw in order to do requested testing, labs ordered below.  ?

## 2021-06-20 NOTE — Telephone Encounter (Signed)
Called back and scheduled

## 2021-06-22 ENCOUNTER — Other Ambulatory Visit (INDEPENDENT_AMBULATORY_CARE_PROVIDER_SITE_OTHER): Payer: BC Managed Care – PPO

## 2021-06-22 DIAGNOSIS — E559 Vitamin D deficiency, unspecified: Secondary | ICD-10-CM | POA: Diagnosis not present

## 2021-06-22 DIAGNOSIS — Z0000 Encounter for general adult medical examination without abnormal findings: Secondary | ICD-10-CM

## 2021-06-22 DIAGNOSIS — R7303 Prediabetes: Secondary | ICD-10-CM

## 2021-06-22 DIAGNOSIS — D649 Anemia, unspecified: Secondary | ICD-10-CM | POA: Diagnosis not present

## 2021-06-22 DIAGNOSIS — Z13220 Encounter for screening for lipoid disorders: Secondary | ICD-10-CM | POA: Diagnosis not present

## 2021-06-22 LAB — COMPREHENSIVE METABOLIC PANEL
ALT: 12 U/L (ref 0–35)
AST: 17 U/L (ref 0–37)
Albumin: 4 g/dL (ref 3.5–5.2)
Alkaline Phosphatase: 43 U/L (ref 39–117)
BUN: 9 mg/dL (ref 6–23)
CO2: 28 mEq/L (ref 19–32)
Calcium: 9 mg/dL (ref 8.4–10.5)
Chloride: 104 mEq/L (ref 96–112)
Creatinine, Ser: 0.65 mg/dL (ref 0.40–1.20)
GFR: 103.94 mL/min (ref >60.00)
Glucose, Bld: 78 mg/dL (ref 70–99)
Potassium: 3.7 mEq/L (ref 3.5–5.1)
Sodium: 137 mEq/L (ref 135–145)
Total Bilirubin: 0.4 mg/dL (ref 0.2–1.2)
Total Protein: 6.6 g/dL (ref 6.0–8.3)

## 2021-06-22 LAB — LIPID PANEL
Cholesterol: 147 mg/dL (ref 0–200)
HDL: 59.6 mg/dL (ref >39.00)
LDL Cholesterol: 76 mg/dL (ref 0–99)
NonHDL: 87.27
Total CHOL/HDL Ratio: 2
Triglycerides: 54 mg/dL (ref 0.0–149.0)
VLDL: 10.8 mg/dL (ref 0.0–40.0)

## 2021-06-22 LAB — CBC WITH DIFFERENTIAL/PLATELET
Basophils Absolute: 0.1 10*3/uL (ref 0.0–0.1)
Basophils Relative: 1 % (ref 0.0–3.0)
Eosinophils Absolute: 0.3 10*3/uL (ref 0.0–0.7)
Eosinophils Relative: 5.7 % — ABNORMAL HIGH (ref 0.0–5.0)
HCT: 39.3 % (ref 36.0–46.0)
Hemoglobin: 12.9 g/dL (ref 12.0–15.0)
Lymphocytes Relative: 32.6 % (ref 12.0–46.0)
Lymphs Abs: 1.8 10*3/uL (ref 0.7–4.0)
MCHC: 32.8 g/dL (ref 30.0–36.0)
MCV: 87.2 fl (ref 78.0–100.0)
Monocytes Absolute: 0.4 10*3/uL (ref 0.1–1.0)
Monocytes Relative: 7.6 % (ref 3.0–12.0)
Neutro Abs: 2.9 10*3/uL (ref 1.4–7.7)
Neutrophils Relative %: 53.1 % (ref 43.0–77.0)
Platelets: 211 10*3/uL (ref 150.0–400.0)
RBC: 4.51 Mil/uL (ref 3.87–5.11)
RDW: 13 % (ref 11.5–15.5)
WBC: 5.5 10*3/uL (ref 4.0–10.5)

## 2021-06-22 LAB — HEMOGLOBIN A1C: Hgb A1c MFr Bld: 5.9 % (ref 4.6–6.5)

## 2021-06-22 LAB — VITAMIN D 25 HYDROXY (VIT D DEFICIENCY, FRACTURES): VITD: 29.59 ng/mL — ABNORMAL LOW (ref 30.00–100.00)

## 2021-06-27 ENCOUNTER — Ambulatory Visit: Payer: BC Managed Care – PPO | Admitting: Psychiatry

## 2021-09-18 NOTE — Progress Notes (Unsigned)
   CC:  headaches  Follow-up Visit  Last visit: 02/15/21  Brief HPI: 51 year old female with a history of prediabetes, vitamin D deficiency who follows in clinic for tension type headaches.  At her last visit [place] was ordered. Interval History: *** CTH 04/05/21 was unremarkable.  Headache days per month: *** Headache free days per month: *** Headache severity: ***  Current Headache Regimen: Preventative: *** Abortive: ***  # of doses of abortive medications per month: ***  Prior Therapies                                  ***  Physical Exam:   Vital Signs: LMP 11/26/2016  GENERAL:  well appearing, in no acute distress, alert  SKIN:  Color, texture, turgor normal. No rashes or lesions HEAD:  Normocephalic/atraumatic. RESP: normal respiratory effort MSK:  No gross joint deformities.   NEUROLOGICAL: Mental Status: Alert, oriented to person, place and time, Follows commands, and Speech fluent and appropriate. Cranial Nerves: PERRL, face symmetric, no dysarthria, hearing grossly intact Motor: moves all extremities equally Gait: normal-based.  IMPRESSION: ***  PLAN: ***   Follow-up: ***  I spent a total of *** minutes on the date of the service. Headache education was done. Discussed lifestyle modification including increased oral hydration, decreased caffeine, exercise and stress management. Discussed treatment options including preventive and acute medications, natural supplements, and infusion therapy. Discussed medication overuse headache and to limit use of acute treatments to no more than 2 days/week or 10 days/month. Discussed medication side effects, adverse reactions and drug interactions. Written educational materials and patient instructions outlining all of the above were given.  Genia Harold, MD

## 2021-09-19 ENCOUNTER — Ambulatory Visit: Payer: BC Managed Care – PPO | Admitting: Psychiatry

## 2021-09-19 VITALS — BP 126/79 | HR 79 | Ht 63.0 in | Wt 153.1 lb

## 2021-09-19 DIAGNOSIS — G4719 Other hypersomnia: Secondary | ICD-10-CM

## 2021-09-19 DIAGNOSIS — M542 Cervicalgia: Secondary | ICD-10-CM

## 2021-09-19 NOTE — Patient Instructions (Signed)
Referral to Dr. Rexene Alberts for updated sleep test

## 2021-09-19 NOTE — Progress Notes (Signed)
   CC:  headaches  Follow-up Visit  Last visit: 02/15/21  Brief HPI: 51 year old female with a history of prediabetes, vitamin D deficiency who follows in clinic for tension type headaches.   At her last visit [place] was ordered.  Interval History: Headaches have resolved, but she continues to have pain in her neck and occiput. She feels her entire body is very tense. Takes Flexeril as needed which helps temporarily but pain always returns. Had an MRI C-spine in May 2023 which did not show any significant spinal canal or foraminal narrowing.   She has been snoring a lot lately and will start to nod off if she is sitting still for over 5 minutes. States she had a sleep study done about 10 years ago which did not show evidence of OSA at that time. She would like to see if she could undergo a repeat sleep study as her symptoms have worsened over time.   [facility] 04/05/21 was unremarkable.  Prior Therapies                                  Tylenol  Physical Exam:   Vital Signs: BP 126/79   Pulse 79   Ht '5\' 3"'$  (1.6 m)   Wt 153 lb 2 oz (69.5 kg)   LMP 11/26/2016   BMI 27.12 kg/m  GENERAL:  well appearing, in no acute distress, alert  SKIN:  Color, texture, turgor normal. No rashes or lesions HEAD:  Normocephalic/atraumatic. RESP: normal respiratory effort MSK:  No gross joint deformities.   NEUROLOGICAL: Mental Status: Alert, oriented to person, place and time, Follows commands, and Speech fluent and appropriate. Cranial Nerves: PERRL, face symmetric, no dysarthria, hearing grossly intact Motor: moves all extremities equally Gait: normal-based.  IMPRESSION: 51 year old female with a history of prediabetes, vitamin D deficiency who presents for follow up of headaches. Her headaches have resolved but she still has significant neck pain and tension. She is more concerned today about her snoring and excessive daytime sleepiness. Discussed physical therapy for the neck, however she is not  interested in starting treatment for her neck pain until she is re-evaluated by Sleep. Will refer to sleep to evaluate if she would benefit from a repeat sleep study to assess for OSA (last study in 2014).  PLAN: -Referral to Sleep team for possible repeat sleep study -Consider neck PT (patient would prefer to wait until after Sleep evaluation for any further treatment)   Follow-up: after testing  I spent a total of 24 minutes on the date of the service. Headache education was done. Discussed treatment options including acute medications and physical therapy. Written educational materials and patient instructions outlining all of the above were given.  Genia Harold, MD 09/19/21 4:05 PM

## 2021-10-02 ENCOUNTER — Telehealth: Payer: Self-pay | Admitting: Psychiatry

## 2021-10-02 NOTE — Telephone Encounter (Signed)
Patient came into the office- Pt states was contacted about sleep study but missed call and was trying to get scheduled. States called back but no response as of today

## 2021-10-18 ENCOUNTER — Other Ambulatory Visit: Payer: Self-pay

## 2021-10-18 ENCOUNTER — Ambulatory Visit: Payer: BC Managed Care – PPO | Admitting: Family Medicine

## 2021-10-18 ENCOUNTER — Encounter: Payer: Self-pay | Admitting: Family Medicine

## 2021-10-18 VITALS — BP 124/72 | HR 75 | Temp 97.6°F | Resp 20 | Ht 63.0 in | Wt 148.2 lb

## 2021-10-18 DIAGNOSIS — M545 Low back pain, unspecified: Secondary | ICD-10-CM

## 2021-10-18 DIAGNOSIS — K5900 Constipation, unspecified: Secondary | ICD-10-CM | POA: Diagnosis not present

## 2021-10-18 MED ORDER — METHOCARBAMOL 500 MG PO TABS: 500.0000 mg | ORAL_TABLET | Freq: Three times a day (TID) | ORAL | 0 refills | Status: DC | PRN

## 2021-10-18 MED ORDER — MELOXICAM 15 MG PO TABS: 15.0000 mg | ORAL_TABLET | Freq: Every day | ORAL | 0 refills | Status: DC

## 2021-10-18 NOTE — Progress Notes (Signed)
   Subjective:    Patient ID: Paula [name], female    DOB: Apr 01, 1970, 51 y.o.   [name]: [id number]  HPI Change in bowel habits- pt reports feeling constipated for the last week.  She took herbal medication last night to help get bowels moving.  This morning had large BM.  R low back pain- occurred suddenly 3 days ago when she was going to lie down in bed.  Has to climb up into bed.  Was very sharp.  She had a hard time walking.  No radiation of pain.  Nothing makes pain better- 'i haven't tried anything'.  Sxs worsen w/ movement.  'my body is so tight'.  Pt just recently moved.  No relief w/ BM.  No burning w/ urination.  No blood in urine.   Review of Systems For ROS see HPI     Objective:   Physical Exam Vitals reviewed.  Constitutional:      General: She is not in acute distress.    Appearance: Normal appearance. She is not ill-appearing.  HENT:     Head: Normocephalic and atraumatic.  Cardiovascular:     Pulses: Normal pulses.  Abdominal:     General: There is no distension.     Palpations: Abdomen is soft.     Tenderness: There is no abdominal tenderness. There is no guarding or rebound.  Musculoskeletal:        General: Tenderness (TTP over R PSIS) present.     Comments: Pain w/ back rotation and extension.  No pain w/ forward flexion.  (-) SLR bilaterally  Skin:    General: Skin is warm and dry.  Neurological:     General: No focal deficit present.     Mental Status: She is alert and oriented to person, place, and time.  Psychiatric:        Mood and Affect: Mood normal.        Behavior: Behavior normal.        Thought Content: Thought content normal.           Assessment & Plan:  Constipation- new.  Pt took herbal remedy yesterday and reports she had a large BM this morning.  Her constipation resolved but her back pain didn't improve.  R low back pain- new.  Occurred suddenly 3 days ago a she was attempting to climb up into her large bed.  She has not tried  anything for the pain.  Worse w/ certain movements.  No radiation of pain.  (-) SLR.  Sxs consistent w/ muscle strain.  Start scheduled NSAIDs and Methocarbamol prn.  Pt expressed understanding and is in agreement w/ plan.

## 2021-10-18 NOTE — Patient Instructions (Signed)
Follow up as needed or as scheduled START the Meloxicam once daily- take w/ food- for pain USE the Methocarbamol (muscle relaxer) as needed for pain and spasm.  May cause drowsiness HEAT to help w/ back pain If symptoms don't improve or worsen, let us know Call with any questions or concerns Hang in there!!!

## 2021-12-18 ENCOUNTER — Ambulatory Visit (INDEPENDENT_AMBULATORY_CARE_PROVIDER_SITE_OTHER): Payer: BC Managed Care – PPO | Admitting: Neurology

## 2021-12-18 ENCOUNTER — Encounter: Payer: Self-pay | Admitting: Neurology

## 2021-12-18 VITALS — BP 110/69 | HR 73 | Ht 62.0 in | Wt 153.6 lb

## 2021-12-18 DIAGNOSIS — R0683 Snoring: Secondary | ICD-10-CM | POA: Diagnosis not present

## 2021-12-18 DIAGNOSIS — R351 Nocturia: Secondary | ICD-10-CM

## 2021-12-18 DIAGNOSIS — R519 Headache, unspecified: Secondary | ICD-10-CM | POA: Diagnosis not present

## 2021-12-18 DIAGNOSIS — G4719 Other hypersomnia: Secondary | ICD-10-CM | POA: Diagnosis not present

## 2021-12-18 NOTE — Progress Notes (Signed)
Subjective:    Patient ID: Paula [name] is a 51 y.o. female.  HPI    Star Age, MD, PhD [facility] [address] [place], [place] [address]  Dear Anderson Malta,   I saw your patient, Paula [name], upon your kind request in my sleep clinic today for evaluation of her sleep disturbance, concern for sleep apnea.  The patient is unaccompanied today.  As you know, Paula [name] is a 51 yo female with an underlying medical history of recurrent headaches, allergies, anemia, and overweight state, who reports loud snoring and excessive daytime somnolence.  I reviewed your office note from 09/19/2021.  Her Epworth sleepiness score is 14 out of 24, fatigue severity score is 22 out of 63.  Her bedtime is around 9 or 10 PM, rise time around 4 AM.  She is a restless sleeper.  She wakes up from her own snoring.  She has nocturia about twice per average night, has woken up rarely with a headache.  She does not drink caffeine daily, she does not drink alcohol, she is a non-smoker.  She works at [employer] in housekeeping.  She lives with her husband and 3 children.  No pets in the household.  She has no TV in the bedroom.  I had evaluated her several years ago for non-restorative sleep, sleep disruption, difficulty with sleep initiation and sleep maintenance, and snoring. She had a baseline sleep study on 01/17/2013 which showed a sleep efficiency of 46.4% with a latency to sleep prolonged at 32 minutes and wake after sleep onset of 177 minutes. She had an increased percentage of stage I sleep, a normal percentage of stage II sleep, a decreased percentage of slow-wave sleep at 7.7%, and a normal REM percentage at 21% with a prolonged REM latency of 177.5 minutes. She had very mild periodic leg movements at 12.2 per hour with no significant arousals at 1.1 per hour. She had mild to moderate snoring. She had a total of 10 obstructive hypopneas. She had an AHI of 2.6 per hour, with  further elevation to 8.4 per hour in the supine position. Her baseline oxygen saturation was normal at 98%, her nadir was 94%. There was no supine REM sleep achieved in the study. She had significant sleep fragmentation. I felt that for her mild positional OSA, avoidance of the supine position and weight loss may constitute sufficient therapy.   Previously:   12/10/12: 51 year old right-handed woman with an underlying medical history of anemia who has been suffering from daytime tiredness for the past year. She has been having trouble to go to sleep and it takes her 1-2 hours to fall asleep. She snores mildly, but has not been reported to have apneas and denies gasping sensations while asleep. She is very frustrated about not being able to sleep and is considering to go back to [place] with her BF and her 3 children. She is worried about getting sick from not sleeping well. She denies AM HAs but feels poorly rested. Her ESS is 8/24.  She goes to bed by 9 PM and has to get up at 4 AM as she has to be at work by 5 PM. She works in housekeeping at [employer]. She tries to exercise on the WE or after work. She feels restless and has aches in her legs and whenever she is on the verge of falling asleep, she feels an inside pain in her body. She feels like moving her body and her legs,  which helps. Her BF does not report that she kicks in her sleep. She is not aware of any FHx of sleep disorders. She does endorse some stressors, but nothing new.  She is a restless sleeper and in the morning, the bed is quite disheveled.   She denies cataplexy, sleep paralysis, hypnagogic or hypnopompic hallucinations, or sleep attacks. She does not report any vivid dreams, nightmares, dream enactments, or parasomnias, such as sleep talking or sleep walking. The patient has not had a sleep study or a home sleep test.  She consumes no caffeinated beverages per day typically.  Her bedroom is usually dark and cool. There is no TV in the  bedroom.  Her Past Medical History Is Significant For: Past Medical History:  Diagnosis Date   Allergy    Anemia     Her Past Surgical History Is Significant For: Past Surgical History:  Procedure Laterality Date   ABDOMINAL HYSTERECTOMY  11/26/2016   Procedure: HYSTERECTOMY ABDOMINAL;  Surgeon: Marylynn Pearson, MD;  Location: [facility];  Service: Gynecology;;   COLONOSCOPY WITH PROPOFOL N/A 09/22/2020   Procedure: COLONOSCOPY WITH PROPOFOL;  Surgeon: Milus Banister, MD;  Location: [facility];  Service: Endoscopy;  Laterality: N/A;   NO PAST SURGERIES     POLYPECTOMY  09/22/2020   Procedure: POLYPECTOMY;  Surgeon: Milus Banister, MD;  Location: [facility];  Service: Endoscopy;;    Her Family History Is Significant For: Family History  Problem Relation Age of Onset   Hypertension Mother    Hypertension Sister    Colon cancer Neg Hx    Esophageal cancer Neg Hx    Ovarian cancer Neg Hx    Pancreatic cancer Neg Hx    Stomach cancer Neg Hx    Sleep apnea Neg Hx     Her Social History Is Significant For: Social History   Socioeconomic History   Marital status: Single    Spouse name: Not on file   Number of children: 3   Years of education: 12   Highest education level: Not on file  Occupational History   Occupation: [employer]    Employer: [employer]  Tobacco Use   Smoking status: Never   Smokeless tobacco: Never  Vaping Use   Vaping Use: Never used  Substance and Sexual Activity   Alcohol use: No   Drug use: No   Sexual activity: Not Currently  Other Topics Concern   Not on file  Social History Narrative   Right handed   Social Determinants of Health   Financial Resource Strain: Not on file  Food Insecurity: Not on file  Transportation Needs: Not on file  Physical Activity: Not on file  Stress: Not on file  Social Connections: Not on file    Her Allergies Are:  No Known Allergies:   Her Current Medications Are:  Outpatient Encounter Medications as of  12/18/2021  Medication Sig   fluticasone (FLONASE) 50 MCG/ACT nasal spray Place 2 sprays into both nostrils daily.   meloxicam (MOBIC) 15 MG tablet Take 1 tablet (15 mg total) by mouth daily.   methocarbamol (ROBAXIN) 500 MG tablet Take 1 tablet (500 mg total) by mouth every 8 (eight) hours as needed for muscle spasms. (Patient not taking: Reported on 12/18/2021)   No facility-administered encounter medications on file as of 12/18/2021.  :   Review of Systems:  Out of a complete 14 point review of systems, all are reviewed and negative with the exception of these symptoms as listed below:  Review of Systems  Neurological:        Pt here for sleep consult Pt snore,some headaches ,some fatigue . Pt denies hypertension ,CPAP machine . Pt states Sleep study 01/2013     ESS:14 FSS:22    Objective:  Neurological Exam  Physical Exam Physical Examination:   Vitals:   12/18/21 1357  BP: 110/69  Pulse: 73   General Examination: The patient is a very pleasant 51 y.o. female in no acute distress. She appears well-developed and well-nourished and well groomed.   HEENT: Normocephalic, atraumatic, pupils are equal, round and reactive to light, extraocular tracking is well-preserved, hearing is grossly intact.  Face is symmetric with normal facial animation, speech is clear without dysarthria, hypophonia or voice tremor.  Neck is supple with full range of motion, no carotid bruits.  Airway examination reveals mild mouth dryness, adequate dental hygiene, moderate airway crowding, due to elongated tongue, tonsillar size of about 2+ and redundant soft palate. Mallampati is class III. Tongue protrudes centrally and palate elevates symmetrically. Neck size is 13.5 inches.  Moderate overbite noted.   Chest: Clear to auscultation without wheezing, rhonchi or crackles noted.   Heart: S1+S2+0, regular and normal without murmurs, rubs or gallops noted.    Abdomen: Soft, non-tender and non-distended.    Extremities: There is no pitting edema in the distal lower extremities bilaterally.   Skin: Warm and dry without trophic changes noted. There are no varicose veins.   Musculoskeletal: exam reveals no obvious joint deformities, tenderness or joint swelling or erythema.    Neurologically:  Mental status: The patient is awake, alert and oriented in all 4 spheres. Her memory, attention, language and knowledge are appropriate. There is no aphasia, agnosia, apraxia or anomia. Speech is clear with normal prosody and enunciation. Thought process is linear. Mood is congruent and affect is normal.  Cranial nerves are as described above under HEENT exam. In addition, shoulder shrug is normal with equal shoulder height noted. Motor exam: Normal bulk, strength and tone is noted. There is no obvious tremor. Fine motor skills and coordination: Grossly intact.  Cerebellar testing shows no dysmetria or intention tremor. There is no truncal or gait ataxia.  Sensory exam is intact to light touch in the upper and lower extremities.  Gait, station and balance are unremarkable. No veering to one side is noted. No leaning to one side is noted. Posture is age-appropriate and stance is narrow based. No problems turning are noted.   Assessment and Plan:  In summary, Paula [name] is a very pleasant 51 y.o.-year old femalwith an underlying medical history of recurrent headaches, allergies, anemia, and overweight state, whose history and physical exam are concerning for sleep disordered breathing.  Her sleep study from 01/17/2013 did not show any significant sleep disordered breathing.  I had a long chat with the patient about my findings and the diagnosis of sleep apnea, particularly OSA, its prognosis and treatment options. We talked about medical/conservative treatments, surgical interventions and non-pharmacological approaches for symptom control. I explained, in particular, the risks and ramifications of untreated  moderate to severe OSA, especially with respect to developing cardiovascular disease down the road, including congestive heart failure (CHF), difficult to treat hypertension, cardiac arrhythmias (particularly A-fib), neurovascular complications including TIA, stroke and dementia. Even type 2 diabetes has, in part, been linked to untreated OSA. Symptoms of untreated OSA may include (but may not be limited to) daytime sleepiness, nocturia (i.e. frequent nighttime urination), memory problems, mood irritability and suboptimally controlled or  worsening mood disorder such as depression and/or anxiety, lack of energy, lack of motivation, physical discomfort, as well as recurrent headaches, especially morning or nocturnal headaches. We talked about the importance of maintaining a healthy lifestyle and striving for healthy weight. In addition, we talked about the importance of striving for and maintaining good sleep hygiene. I recommended the following at this time: sleep study.  I outlined the differences between a laboratory attended sleep study which is considered more comprehensive and accurate over the option of a home sleep test (HST); the latter may lead to underestimation of sleep disordered breathing in some instances and does not help with diagnosing upper airway resistance syndrome and is not accurate enough to diagnose primary central sleep apnea typically. I explained the different sleep test procedures to the patient in detail and also outlined possible surgical and non-surgical treatment options of OSA, including the use of a pressure airway pressure (PAP) device (ie CPAP, AutoPAP/APAP or BiPAP in certain circumstances), a custom-made dental device (aka oral appliance, which would require a referral to a specialist dentist or orthodontist typically, and is generally speaking not considered a good choice for patients with full dentures or edentulous state), upper airway surgical options, such as traditional  UPPP (which is not considered a first-line treatment) or the Inspire device (hypoglossal nerve stimulator, which would involve a referral for consultation with an ENT surgeon, after careful selection, following inclusion criteria). I explained the PAP treatment option to the patient in detail, as this is generally considered first-line treatment.  The patient indicated that she would be willing to try PAP therapy, if the need arises. I explained the importance of being compliant with PAP treatment, not only for insurance purposes but primarily to improve patient's symptoms symptoms, and for the patient's long term health benefit, including to reduce Her cardiovascular risks longer-term.    We will pick up our discussion about the next steps and treatment options after testing.  We will keep her posted as to the test results by phone call and/or MyChart messaging where possible.  We will plan to follow-up in sleep clinic accordingly as well.  I answered all her questions today and the patient was in agreement.   I encouraged her to call with any interim questions, concerns, problems or updates or email [place] through [place].  Generally speaking, sleep test authorizations may take up to 2 weeks, sometimes less, sometimes longer, the patient is encouraged to get in touch with [place] if they do not hear back from the sleep lab staff directly within the next 2 weeks.  Thank you very much for allowing me to participate in the care of this nice patient. If I can be of any further assistance to you please do not hesitate to talk to me.   Sincerely,   Star Age, MD, PhD

## 2021-12-18 NOTE — Patient Instructions (Signed)
It was nice to see you again today.    Based on your symptoms and your exam I believe you may be at risk for obstructive sleep apnea (aka OSA). We should proceed with a sleep study to determine whether you do or do not have OSA and how severe it is. Even, if you have mild OSA, I may want you to consider treatment with CPAP, as treatment of even borderline or mild sleep apnea can result and improvement of symptoms such as sleep disruption, daytime sleepiness, nighttime bathroom breaks, restless leg symptoms, improvement of headache syndromes, even improved mood disorder.   As explained, an attended sleep study (meaning you get to stay overnight in the sleep lab), lets [place] monitor sleep-related behaviors such as sleep talking and leg movements in sleep, in addition to monitoring for sleep apnea.  A home sleep test is a screening tool for sleep apnea diagnosis only, but unfortunately, does not help with any other sleep-related diagnoses.  Please remember, the long-term risks and ramifications of untreated moderate to severe obstructive sleep apnea may include (but are not limited to): increased risk for cardiovascular disease, including congestive heart failure, stroke, difficult to control hypertension, treatment resistant obesity, arrhythmias, especially irregular heartbeat commonly known as A. Fib. (atrial fibrillation); even type 2 diabetes has been linked to untreated OSA.   Other correlations that untreated obstructive sleep apnea include macular edema which is swelling of the retina in the eyes, droopy eyelid syndrome, and elevated hemoglobin and hematocrit levels (often referred to as polycythemia).  Sleep apnea can cause disruption of sleep and sleep deprivation in most cases, which, in turn, can cause recurrent headaches, problems with memory, mood, concentration, focus, and vigilance. Most people with untreated sleep apnea report excessive daytime sleepiness, which can affect their ability to drive.  Please do not drive or use heavy equipment or machinery, if you feel sleepy! Patients with sleep apnea can also develop difficulty initiating and maintaining sleep (aka insomnia).   Having sleep apnea may increase your risk for other sleep disorders, including involuntary behaviors sleep such as sleep terrors, sleep talking, sleepwalking.    Having sleep apnea can also increase your risk for restless leg syndrome and leg movements at night.   Please note that untreated obstructive sleep apnea may carry additional perioperative morbidity. Patients with significant obstructive sleep apnea (typically, in the moderate to severe degree) should receive, if possible, perioperative PAP (positive airway pressure) therapy and the surgeons and particularly the anesthesiologists should be informed of the diagnosis and the severity of the sleep disordered breathing.   We will call you or email you through [place] with regards to your test results and plan a follow-up in sleep clinic accordingly. Most likely, you will hear from one of our nurses.   Our sleep lab administrative assistant will call you to schedule your sleep study and give you further instructions, regarding the check in process for the sleep study, arrival time, what to bring, when you can expect to leave after the study, etc., and to answer any other logistical questions you may have. If you don't hear back from her by about 2 weeks from now, please feel free to call her direct line at [phone] or you can call our general clinic number, or email [place] through My Chart.

## 2021-12-28 ENCOUNTER — Telehealth: Payer: Self-pay | Admitting: Neurology

## 2021-12-28 NOTE — Telephone Encounter (Signed)
[employer] [place] no auth req.  Vmail was left on 12/21/21.

## 2022-01-02 NOTE — Telephone Encounter (Signed)
LVM for pt to call back to schedule.

## 2022-01-04 NOTE — Telephone Encounter (Signed)
Patient returned my call.Marland Kitchen  [employer]- [employer] [place] no auth req - pt chose   She is scheduled at [facility] for 02/28/22 at 8 pm.  Mailed packet to the patient.

## 2022-02-05 ENCOUNTER — Ambulatory Visit: Payer: BC Managed Care – PPO | Admitting: Family Medicine

## 2022-02-12 ENCOUNTER — Other Ambulatory Visit: Payer: Self-pay | Admitting: Obstetrics and Gynecology

## 2022-02-12 DIAGNOSIS — R928 Other abnormal and inconclusive findings on diagnostic imaging of breast: Secondary | ICD-10-CM

## 2022-02-27 ENCOUNTER — Other Ambulatory Visit: Payer: BC Managed Care – PPO

## 2022-02-28 ENCOUNTER — Ambulatory Visit (INDEPENDENT_AMBULATORY_CARE_PROVIDER_SITE_OTHER): Payer: BC Managed Care – PPO | Admitting: Neurology

## 2022-02-28 DIAGNOSIS — R0683 Snoring: Secondary | ICD-10-CM

## 2022-02-28 DIAGNOSIS — G4719 Other hypersomnia: Secondary | ICD-10-CM

## 2022-02-28 DIAGNOSIS — R519 Headache, unspecified: Secondary | ICD-10-CM

## 2022-02-28 DIAGNOSIS — G4733 Obstructive sleep apnea (adult) (pediatric): Secondary | ICD-10-CM

## 2022-02-28 DIAGNOSIS — R351 Nocturia: Secondary | ICD-10-CM

## 2022-02-28 DIAGNOSIS — G4720 Circadian rhythm sleep disorder, unspecified type: Secondary | ICD-10-CM

## 2022-03-15 ENCOUNTER — Ambulatory Visit
Admission: RE | Admit: 2022-03-15 | Discharge: 2022-03-15 | Disposition: A | Discharge status: Home / Self Care | Payer: BC Managed Care – PPO | Source: Ambulatory Visit | Attending: Obstetrics and Gynecology | Admitting: Obstetrics and Gynecology

## 2022-03-15 DIAGNOSIS — R928 Other abnormal and inconclusive findings on diagnostic imaging of breast: Secondary | ICD-10-CM

## 2022-03-15 NOTE — Procedures (Signed)
[facility] POLYSOMNOGRAPHY  INTERPRETATION REPORT   STUDY DATE:  02/28/2022     PATIENT NAME:  Paula [name]         DATE OF BIRTH:  1970/04/15  PATIENT ID:  [id number]    TYPE OF STUDY:  PSG  READING PHYSICIAN: Star Age, MD, PhD     SCORING TECHNICIAN: Gaylyn Cheers, RPSGT   Referred by: Dr. Genia Harold  Physician Interpretation:   History: 52 yo female with an underlying medical history of recurrent headaches, allergies, anemia, and overweight state, who reports loud snoring and?excessive daytime somnolence. ?Her Epworth sleepiness score is 14 out of 24, fatigue severity score is 22 out of 63. Height: 62 in Weight: 153 lb (BMI 27) Neck Size: 14 in   MEDICATIONS: Flonase, Mobic, Robaxin  TECHNICAL DESCRIPTION: A registered sleep technologist was in attendance for the duration of the recording.  Data collection, scoring, video monitoring, and reporting were performed in compliance with the AASM Manual for the Scoring of Sleep and Associated Events; (Hypopnea is scored based on the criteria listed in Section VIII D. 1b in the AASM Manual V2.6 using a 4% oxygen desaturation rule or Hypopnea is scored based on the criteria listed in Section VIII D. 1a in the AASM Manual V2.6 using 3% oxygen desaturation and /or arousal rule).   SLEEP CONTINUITY AND SLEEP ARCHITECTURE:  Lights-out was at 21:48: and lights-on at  04:45:, with a total recording time of 6 hours, 56.5 min. Total sleep time ( TST) was 331.5 minutes with a decreased sleep efficiency at 79.6%.   BODY POSITION:  TST was divided  between the following sleep positions: 100.0% supine;  0.0% lateral;  0% prone. Duration of total sleep and percent of total sleep in their respective position is as follows: supine 331 minutes (100%), non-supine 0 minutes (0%); right 00 minutes (0%), left 00 minutes (0%), and prone 00 minutes (0%).  Total supine REM sleep time was 63 minutes (100% of total REM  sleep). Sleep latency was decreased at 4.0 minutes.  REM sleep latency was normal at 72.5 minutes. Of the total sleep time, the percentage of stage N1 sleep was 7.1%, stage N2 sleep was 74%, which is increased, stage N3 sleep was absent, and REM sleep was 19.0%, which is normal. Wake after sleep onset (WASO) time accounted for 81 with minimal to mild.   RESPIRATORY MONITORING:  Based on CMS criteria (using a 4% oxygen desaturation rule for scoring hypopneas), there were 38 apneas (33 obstructive; 0 central; 5 mixed), and 6 hypopneas.  Apnea index was 6.9. Hypopnea index was 1.1. The apnea-hypopnea index was 8.0 overall (8.0 supine, 0 non-supine; 23.8 REM, 23.8 supine REM). There were 0 respiratory effort-related arousals (RERAs).  The RERA index was 0 events/h. Total respiratory disturbance index (RDI) was 8.0 events/h. RDI results showed: supine RDI  8.0 /h; non-supine RDI 0.0 /h; REM RDI 23.8 /h, supine REM RDI 23.8 /h.   Based on AASM criteria (using a 3% oxygen desaturation and /or arousal rule for scoring hypopneas), there were 38 apneas (33 obstructive; 0 central; 5 mixed), and 25 hypopneas. Apnea index was 6.9. Hypopnea index was 4.5. The apnea-hypopnea index was 11.4/hour overall (11.4 supine, 0 non-supine; 29.5 REM, 29.5 supine REM). There were 0 respiratory effort-related arousals (RERAs).  The RERA index was 0 events/h. Total respiratory disturbance index (RDI) was 11.4 events/h. RDI results showed: supine RDI  11.4 /h; non-supine RDI 0.0 /h; REM RDI 29.5 /h, supine REM  RDI 29.5 /h.   OXIMETRY: Oxyhemoglobin Saturation Nadir during sleep was at  87%) from a mean of 97%.  Of the Total sleep time (TST)   hypoxemia (=<88%) was present for  0.1 minutes, or 0.0% of total sleep time.   LIMB MOVEMENTS: There were 0 periodic limb movements of sleep (0.0/hr), of which 0 (0.0/hr) were associated with an arousal.  AROUSAL: There were 100 arousals in total, for an arousal index of 18 arousals/hour.  Of  these, 24 were identified as respiratory-related arousals (4 /h), 0 were PLM-related arousals (0 /h), and 68 were non-specific arousals (12 /h).  EEG: Review of the EEG showed no abnormal electrical discharges and symmetrical bihemispheric findings.    EKG: The EKG revealed normal sinus rhythm (NSR). The average heart rate during sleep was 64 bpm.   AUDIO/VIDEO REVIEW: The audio and video review did not show any abnormal or unusual behaviors, movements, phonations or vocalizations.  The patient took 2 restroom breaks.  Snoring was intermittent in the mild to moderate range.  POST-STUDY QUESTIONNAIRE: Post study, the patient indicated, that sleep was the same as usual.   IMPRESSION:  1. Mild obstructive sleep apnea (OSA) 2. Dysfunctions associated with sleep stages or arousal from sleep  RECOMMENDATIONS:  1. This study demonstrates overall mild obstructive sleep apnea, near-severe in REM sleep with a total AHI of 11.4/hour, REM AHI of 29.5/hour, supine AHI of 11.4/hour and O2 nadir of 87%. Given the patient's medical history and sleep related complaints, treatment with positive airway pressure is recommended; this can be achieved in the form of autoPAP. Alternatively, a full-night CPAP titration study would allow optimization of therapy if needed. Other treatment options may include avoidance of supine sleep position along with weight loss, or the use of an oral appliance in selected patients. Please note, that untreated obstructive sleep apnea may carry additional perioperative morbidity. Patients with significant obstructive sleep apnea should receive perioperative PAP therapy and the surgeons and particularly the anesthesiologist should be informed of the diagnosis and the severity of the sleep disordered breathing. 2. This study shows mild sleep fragmentation and abnormal sleep stage percentages; these are nonspecific findings and per se do not signify an intrinsic sleep disorder or a cause for  the patient's sleep-related symptoms. Causes include (but are not limited to) the first night effect of the sleep study, circadian rhythm disturbances, medication effect or an underlying mood disorder or medical problem.  3. The patient should be cautioned not to drive, work at heights, or operate dangerous or heavy equipment when tired or sleepy. Review and reiteration of good sleep hygiene measures should be pursued with any patient. 4. The patient will be seen in follow-up in sleep clinic as necessary.  The patient and her referring provider will be notified of the test results.   I certify that I have reviewed the entire raw data recording prior to the issuance of this report in accordance with the Standards of Accreditation of the American Academy of Sleep Medicine (AASM).  Star Age, MD, PhD Medical Director, [place] [facility]) [place], ABPN (Neurology and Sleep)              Technical Report:   General Information  Name: Paula [name], Paula [name] BMI: 27.98 Physician: Star Age, MD  ID: [id number] Height: 62.0 in Technician: Gaylyn Cheers, RPSGT  Sex: Female Weight: 153.0 lb Record: [id number]  Age: 4 [Sep 13, 1970] Date: 02/28/2022    Medical & Medication History    Ms. Pae  is a 52 yo female with an underlying medical history of recurrent headaches, allergies, anemia, and overweight state, who reports loud snoring and excessive daytime somnolence. Her Epworth sleepiness score is 14 out of 24, fatigue severity score is 22 out of 63. Her bedtime is around 9 or 10 PM, rise time around 4 AM. She is a restless sleeper. She wakes up from her own snoring.  Flonase, Mobic, Robaxin   Sleep Disorder      Comments   Patient arrived for a diagnostic polysomnogram. Procedure explained and all questions answered. Standard paste setup without complications. Patient slept supine. Mild to moderate occasional snoring heard. Respiratory events observed.  No obvious cardiac arrhythmias observed. No significant PLMS observed. No significant movement disorder or REM behavior disorder observed. Two restroom visits.     Lights out: 09:48:47 PM Lights on: 04:45:07 AM   Time Total Supine Side Prone Upright  Recording (TRT) 6h 56.3362m6h 53.077mh 3.362m64m 0.262m 31m0.262m  37mep (TST) 5h 31.362m 5h6362m.362m 0h 33mm 0h 026m 0h 0.16m  Late74m N1 N2 N3 REM Onset Per. Slp. Eff.  Actual 0h 0.262m 0h 5.362m31m 0.262m 5362m12.362m 88m4.262m 0h76m262m 79.3262m   Stg3mr Wake N1 N2 N3 REM  Total 85.0 23.5 245.0 0.0 63.0  Supine 81.5 23.5 245.0 0.0 63.0  Side 3.5 0.0 0.0 0.0 0.0  Prone 0.0 0.0 0.0 0.0 0.0  Upright 0.0 0.0 0.0 0.0 0.0   Stg % Wake N1 N2 N3 REM  Total 20.4 7.1 73.9 0.0 19.0  Supine 19.6 7.1 73.9 0.0 19.0  Side 0.8 0.0 0.0 0.0 0.0  Prone 0.0 0.0 0.0 0.0 0.0  Upright 0.0 0.0 0.0 0.0 0.0     Apnea Summary Sub Supine Side Prone Upright  Total 38 Total 38 38 0 0 0    REM 21 21 0 0 0    NREM 17 17 0 0 0  Obs 33 REM 20 20 0 0 0    NREM 13 13 0 0 0  Mix 5 REM 1 1 0 0 0    NREM 4 4 0 0 0  Cen 0 REM 0 0 0 0 0    NREM 0 0 0 0 0   Rera Summary Sub Supine Side Prone Upright  Total 0 Total 0 0 0 0 0    REM 0 0 0 0 0    NREM 0 0 0 0 0   Hypopnea Summary Sub Supine Side Prone Upright  Total 25 Total 25 25 0 0 0    REM 10 10 0 0 0    NREM 15 15 0 0 0   4% Hypopnea Summary Sub Supine Side Prone Upright  Total (4%) 6 Total 6 6 0 0 0    REM 4 4 0 0 0    NREM 2 2 0 0 0     AHI Total Obs Mix Cen  11.40 Apnea 6.88 5.97 0.90 0.00   Hypopnea 4.52 -- -- --  7.96 Hypopnea (4%) 1.09 -- -- --    Total Supine Side Prone Upright  Position AHI 11.40 11.40 0.00 0.00 0.00  REM AHI 29.52   NREM AHI 7.15   Position RDI 11.40 11.40 0.00 0.00 0.00  REM RDI 29.52   NREM RDI 7.15    4% Hypopnea Total Supine Side Prone Upright  Position AHI (4%) 7.96 7.96 0.00 0.00 0.00  REM AHI (4%) 23.81   NREM AHI (4%)  4.25   Position RDI (4%) 7.96 7.96 0.00 0.00 0.00  REM RDI (4%)  23.81   NREM RDI (4%) 4.25    Desaturation Information Threshold: 2% <100% <90% <80% <70% <60% <50% <40%  Supine 173.0 1.0 0.0 0.0 0.0 0.0 0.0  Side 1.0 0.0 0.0 0.0 0.0 0.0 0.0  Prone 0.0 0.0 0.0 0.0 0.0 0.0 0.0  Upright 0.0 0.0 0.0 0.0 0.0 0.0 0.0  Total 174.0 1.0 0.0 0.0 0.0 0.0 0.0  Index 26.2 0.2 0.0 0.0 0.0 0.0 0.0   Threshold: 3% <100% <90% <80% <70% <60% <50% <40%  Supine 59.0 1.0 0.0 0.0 0.0 0.0 0.0  Side 0.0 0.0 0.0 0.0 0.0 0.0 0.0  Prone 0.0 0.0 0.0 0.0 0.0 0.0 0.0  Upright 0.0 0.0 0.0 0.0 0.0 0.0 0.0  Total 59.0 1.0 0.0 0.0 0.0 0.0 0.0  Index 8.9 0.2 0.0 0.0 0.0 0.0 0.0   Threshold: 4% <100% <90% <80% <70% <60% <50% <40%  Supine 23.0 1.0 0.0 0.0 0.0 0.0 0.0  Side 0.0 0.0 0.0 0.0 0.0 0.0 0.0  Prone 0.0 0.0 0.0 0.0 0.0 0.0 0.0  Upright 0.0 0.0 0.0 0.0 0.0 0.0 0.0  Total 23.0 1.0 0.0 0.0 0.0 0.0 0.0  Index 3.5 0.2 0.0 0.0 0.0 0.0 0.0   Threshold: 3% <100% <90% <80% <70% <60% <50% <40%  Supine 59 1 0 0 0 0 0  Side 0 0 0 0 0 0 0  Prone 0 0 0 0 0 0 0  Upright 0 0 0 0 0 0 0  Total 59 1 0 0 0 0 0   Awakening/Arousal Information # of Awakenings 20  Wake after sleep onset 81.21m Wake after persistent sleep 79.558m Arousal Assoc. Arousals Index  Apneas 14 2.5  Hypopneas 10 1.8  Leg Movements 13 2.4  Snore 0 0.0  PTT Arousals 0 0.0  Spontaneous 68 12.3  Total 105 19.0  Leg Movement Information PLMS LMs Index  Total LMs during PLMS 0 0.0  LMs w/ Microarousals 0 0.0   LM LMs Index  w/ Microarousal 13 2.4  w/ Awakening 0 0.0  w/ Resp Event 0 0.0  Spontaneous 8 1.4  Total 21 3.8     Desaturation threshold setting: 3% Minimum desaturation setting: 10 seconds SaO2 nadir: 79% The longest event was a 31 sec obstructive Hypopnea with a minimum SaO2 of 95%. The lowest SaO2 was 90% associated with a 19 sec obstructive Apnea. EKG Rates EKG Avg Max Min  Awake 71 107 56  Asleep 64 88 54  EKG Events: N/A

## 2022-03-16 NOTE — Addendum Note (Signed)
Addended by: Star Age on: 03/16/2022 12:12 PM   Modules accepted: Orders

## 2022-03-20 ENCOUNTER — Telehealth: Payer: Self-pay | Admitting: *Deleted

## 2022-03-20 NOTE — Telephone Encounter (Signed)
-----   Message from Star Age, MD sent at 03/16/2022 12:12 PM EST ----- Patient referred by Dr. Billey Gosling, seen by me on 12/18/21, diagnostic PSG on 02/28/22.    Please call and notify the patient that the recent sleep study did confirm the diagnosis of obstructive sleep apnea. OSA is overall mild, but worth treating to see if she feels better after treatment. To that end I recommend treatment for this in the form of autoPAP, which means, that we don't have to bring her back for a second sleep study with CPAP, but will let him try an autoPAP machine at home, through a DME company (of her choice, or as per insurance requirement). The DME representative will educate her on how to use the machine, how to put the mask on, etc. I have placed an order in the chart. Please send referral, talk to patient, send report to referring MD. We will need a FU in sleep clinic for 10 weeks post-PAP set up, please arrange that with me or one of our NPs. Thanks,   Star Age, MD, PhD [facility])

## 2022-03-20 NOTE — Telephone Encounter (Signed)
I called pt  gave her the results of the sleep study.  Mild OSA, recommends trying autopap to see if feels better.  Will send order to [facility] (fax confirmation received) .  Pt to use autopap 4 hour at least every night and will see back for compliance appt 06-14-2022 at 1415.  Pt verbalized understanding of plan.  She will call back if questions or does not hear from [facility] in a weeks time.

## 2022-03-29 ENCOUNTER — Telehealth: Payer: Self-pay | Admitting: *Deleted

## 2022-03-29 NOTE — Telephone Encounter (Signed)
Received from [facility] that pt refused services for cpap machine.  I cancelled her 06-14-2022 appt for initial cpap.

## 2022-04-09 ENCOUNTER — Ambulatory Visit: Payer: BC Managed Care – PPO | Admitting: Family Medicine

## 2022-04-19 ENCOUNTER — Ambulatory Visit: Payer: BC Managed Care – PPO | Admitting: Family Medicine

## 2022-04-19 VITALS — BP 124/72 | HR 73 | Temp 97.6°F | Ht 62.0 in | Wt 153.8 lb

## 2022-04-19 DIAGNOSIS — R041 Hemorrhage from throat: Secondary | ICD-10-CM | POA: Diagnosis not present

## 2022-04-19 DIAGNOSIS — J309 Allergic rhinitis, unspecified: Secondary | ICD-10-CM

## 2022-04-19 DIAGNOSIS — M542 Cervicalgia: Secondary | ICD-10-CM

## 2022-04-19 DIAGNOSIS — R042 Hemoptysis: Secondary | ICD-10-CM

## 2022-04-19 NOTE — Patient Instructions (Addendum)
Restart Flonase for nasal congestion.  Use saline or salt water nasal spray 3 times per day to help with dry nasal mucosa, and Vaseline just inside nasal passages can be helpful as that was recommended by ear nose and throat specialist last year.  they also recommended humidifier and I recommend the same.  Make sure to use a humidifier in the room where you sleep.  I will check ultrasound of the neck and chest x-ray at [facility] imaging.  This can be done on the same day.  Follow-up with me in the next 2 weeks.  If not improving, I can have you follow back up with ear nose and throat but I think it is worth a try of the approach as above first.  If any worsening symptoms please see me sooner.   Soreness in back of neck may be due to tight or sore muscles.  Gentle range of motion or stretching of neck throughout the day and before you go to sleep, warm compress or warm shower/bath can also help at bedtime.  We can recheck that as well in the next 2 weeks.  Tylenol if needed temporarily.  Return to the clinic or go to the nearest emergency room if any of your symptoms worsen or new symptoms occur.

## 2022-04-19 NOTE — Progress Notes (Signed)
Subjective:  Patient ID: Paula [name], female    DOB: 07-31-1970  Age: 52 y.o. [name]: [id number]  CC:  Chief Complaint  Patient presents with   Sinus Problem    Pt notes each year she will have issues with allergies and notes she is getting dried blood in her blowing her nose, unsure what she should do     HPI [name] presents for   Nasal irritation History of allergic rhinitis,  similar symptoms each year with some allergies, dried blood with blowing nose past few months. Blood tinged phlegm at times. Feels in back of throat then coughs up.  Used flonase in past for allergies, none in past 2 months. Noted blood in mucus and phlegm when using flonase as well. Not having nosebleed requiring applied pressure. Clear nasal d/c with blood mixed in.  Seen by ENT in past for similar sx/s - June 2023, Dr. Randolm Idol.  Mild septal deviation, significant bilateral inferior turbinate hypertrophy with no evidence of active bleeding or dried blood.  Patient was reassured.  Recommended appropriate use of Flonase for nasal congestion, application of Vaseline ointment on a nightly basis and use of a humidifier to maintain nasal hygiene. Has not used vaseline ointment.  No shortness of breath.  No chest pain.  No fever No weight loss No night sweats.  Bumps in front of neck at times - sore at times.  No chewing tobacco.  Nonsmoker, no secondhand smoke exposure.      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   Pre-diabetes 03/02/2020   Vitamin D deficiency 03/02/2020   Cervicalgia 12/23/2019   Fibroids 11/26/2016   Amenorrhea 08/05/2012   Past Medical History:  Diagnosis Date   Allergy    Anemia    Past Surgical History:  Procedure Laterality Date   ABDOMINAL HYSTERECTOMY  11/26/2016   Procedure: HYSTERECTOMY ABDOMINAL;  Surgeon: Marylynn Pearson, MD;  Location: [facility];  Service: Gynecology;;   COLONOSCOPY WITH PROPOFOL N/A  09/22/2020   Procedure: COLONOSCOPY WITH PROPOFOL;  Surgeon: Milus Banister, MD;  Location: [facility];  Service: Endoscopy;  Laterality: N/A;   NO PAST SURGERIES     POLYPECTOMY  09/22/2020   Procedure: POLYPECTOMY;  Surgeon: Milus Banister, MD;  Location: [facility];  Service: Endoscopy;;   No Known Allergies Prior to Admission medications   Medication Sig Start Date End Date Taking? Authorizing Provider  fluticasone (FLONASE) 50 MCG/ACT nasal spray Place 2 sprays into both nostrils daily. 05/04/21   Wendie Agreste, MD  meloxicam (MOBIC) 15 MG tablet Take 1 tablet (15 mg total) by mouth daily. Patient not taking: Reported on 04/19/2022 10/18/21   Midge Minium, MD  methocarbamol (ROBAXIN) 500 MG tablet Take 1 tablet (500 mg total) by mouth every 8 (eight) hours as needed for muscle spasms. Patient not taking: Reported on 12/18/2021 10/18/21   Midge Minium, MD   Social History   Socioeconomic History   Marital status: Single    Spouse name: Not on file   Number of children: 3   Years of education: 76   Highest education level: Not on file  Occupational History   Occupation: [employer]    Employer: [employer]  Tobacco Use   Smoking status: Never   Smokeless tobacco: Never  Vaping Use   Vaping Use: Never used  Substance and Sexual Activity   Alcohol use: No   Drug use: No   Sexual activity: Not  Currently  Other Topics Concern   Not on file  Social History Narrative   Right handed   Social Determinants of Health   Financial Resource Strain: Not on file  Food Insecurity: Not on file  Transportation Needs: Not on file  Physical Activity: Not on file  Stress: Not on file  Social Connections: Not on file  Intimate Partner Violence: Not on file    Review of Systems   Objective:   Vitals:   04/19/22 1431  BP: 124/72  Pulse: 73  Temp: 97.6 F (36.4 C)  TempSrc: Temporal  SpO2: 100%  Weight: 153 lb 12.8 oz (69.8 kg)  Height: 5' 2"$  (1.575 m)     Physical  Exam Vitals reviewed.  Constitutional:      General: She is not in acute distress.    Appearance: She is well-developed.  HENT:     Head: Normocephalic and atraumatic.     Right Ear: Hearing, tympanic membrane, ear canal and external ear normal.     Left Ear: Hearing, tympanic membrane, ear canal and external ear normal.     Nose: Congestion (Turbinate edema right greater than left with small area of blood sitting on the right septum.  No active bleeding, no clots.) present.     Mouth/Throat:     Mouth: Mucous membranes are moist.     Pharynx: No posterior oropharyngeal erythema.     Comments: Difficulty visualizing oropharynx/posterior oropharynx but no visible blood. Eyes:     Conjunctiva/sclera: Conjunctivae normal.     Pupils: Pupils are equal, round, and reactive to light.  Neck:     Comments: AC nodes tender to palpation, with possible slight enlargement.  No appreciable unilateral mass.  Trachea midline.  No stridor.  Posterior neck musculature with slight spasm, tender to palpation.  No midline bony tenderness. Cardiovascular:     Rate and Rhythm: Normal rate and regular rhythm.     Heart sounds: Normal heart sounds. No murmur heard. Pulmonary:     Effort: Pulmonary effort is normal. No respiratory distress.     Breath sounds: Normal breath sounds. No wheezing or rhonchi.  Skin:    General: Skin is warm and dry.     Findings: No rash.  Neurological:     Mental Status: She is alert and oriented to person, place, and time.  Psychiatric:        Mood and Affect: Mood normal.        Behavior: Behavior normal.      Assessment & Plan:  [name] is a 52 y.o. female . Bleeding from nasopharynx Hemoptysis - Plan: DG Chest 2 View Allergic rhinitis, unspecified seasonality, unspecified trigger  -Suspect the hemoptysis is postnasal drainage of blood from nasopharynx, but will check chest x-ray.  If persistent may need CT imaging or pulmonary eval.  For nasal symptoms  recommended treatment as discussed by ENT previously.  Humidifier, Vaseline to nares, and consistent use of Flonase.  Also discussed short term saline nasal spray, especially with dry air.  RTC precautions.  Anterior neck pain - Plan: US Soft Tissue Head/Neck (NON-THYROID)  -Possible sore/lymphadenopathy, no appreciable mass.  Check ultrasound.  Sore neck  -Posterior neck pain appears to be muscular.  Range of motion, stretches with RTC precautions given.  No known specific injury, hold on imaging at this time.  No orders of the defined types were placed in this encounter.  Patient Instructions  Restart Flonase for nasal congestion.  Use saline or salt water nasal  spray 3 times per day to help with dry nasal mucosa, and Vaseline just inside nasal passages can be helpful as that was recommended by ear nose and throat specialist last year.  they also recommended humidifier and I recommend the same.  Make sure to use a humidifier in the room where you sleep.  I will check ultrasound of the neck and chest x-ray at [facility] imaging.  This can be done on the same day.  Follow-up with me in the next 2 weeks.  If not improving, I can have you follow back up with ear nose and throat but I think it is worth a try of the approach as above first.  If any worsening symptoms please see me sooner.   Soreness in back of neck may be due to tight or sore muscles.  Gentle range of motion or stretching of neck throughout the day and before you go to sleep, warm compress or warm shower/bath can also help at bedtime.  We can recheck that as well in the next 2 weeks.  Tylenol if needed temporarily.  Return to the clinic or go to the nearest emergency room if any of your symptoms worsen or new symptoms occur.        Signed,   Merri Ray, MD [place] 04/19/22 3:22 PM

## 2022-04-21 ENCOUNTER — Encounter: Payer: Self-pay | Admitting: Family Medicine

## 2022-04-23 ENCOUNTER — Telehealth: Payer: Self-pay | Admitting: Family Medicine

## 2022-04-23 NOTE — Telephone Encounter (Signed)
Called back and informed them of this

## 2022-04-23 NOTE — Telephone Encounter (Signed)
Please indicate desire to include thyroid or not so I may call back and provide that information

## 2022-04-23 NOTE — Telephone Encounter (Signed)
[facility] is calling in wanting to know if we want to include the Thyroid within the scan.  If so please give them a call back at [phone] with Option 1 and than option 5.

## 2022-04-23 NOTE — Telephone Encounter (Signed)
Yes, can include thyroid. Thanks.

## 2022-05-03 ENCOUNTER — Ambulatory Visit: Payer: BC Managed Care – PPO | Admitting: Family Medicine

## 2022-05-03 ENCOUNTER — Encounter: Payer: Self-pay | Admitting: Family Medicine

## 2022-05-03 VITALS — BP 112/84 | HR 66 | Ht 62.0 in | Wt 154.0 lb

## 2022-05-03 DIAGNOSIS — M542 Cervicalgia: Secondary | ICD-10-CM

## 2022-05-03 DIAGNOSIS — R042 Hemoptysis: Secondary | ICD-10-CM

## 2022-05-03 DIAGNOSIS — R041 Hemorrhage from throat: Secondary | ICD-10-CM

## 2022-05-03 MED ORDER — METHOCARBAMOL 500 MG PO TABS: 500.0000 mg | ORAL_TABLET | Freq: Three times a day (TID) | ORAL | 0 refills | Status: DC | PRN

## 2022-05-03 NOTE — Progress Notes (Signed)
Subjective:  Patient ID: Paula [name], female    DOB: 1970-12-31  Age: 52 y.o. [name]: [id number]  CC:  Chief Complaint  Patient presents with   Epistaxis    Pt states sx are unchanged; episodes when blow nose.    Neck Pain    Same; pt seen neuro in past with following  Plan: Consider neck PT (patient would prefer to wait until after Sleep evaluation for any further treatment)    HPI Paula [name] presents for  Epistaxis: Suspected hemoptysis from postnasal drainage -  chest x-ray obtained without concerns.  Also notes CT or pulmonary eval of persistent hemoptysis at her last visit.  Recommended using Mehta fire, Vaseline to nares for the nasal irritation had been discussed previously by ENT.  Short-term saline nasal spray also discussed as well as restarting Flonase.  Chest x-ray and thyroid ultrasound were ordered, thyroid ultrasound pending from March 11.  Has not had x-ray. Since last visit - started back on flonase twice per day.  Has not tried saline spray or vaseline as recommended at last visit- thought saline was Rx. Has vaseline at home Still some bleeding with blowing nose and blood in phlegm.  Using humidifier - not helping.  Not having to use pressure to stop a nosebleed.   Neck pain: Anterior neck pain discussed at last visit as well as posterior neck pain.  Ultrasound ordered to evaluate for lymph.  Thought to be muscular posterior neck pain, range of motion stretches were discussed. Tried massage and stretches for neck - not helping. No arm radiation/weakness. No worsening - same.  Mm relaxant in past helped some. Then comes back.  MR cervical spine in 2022  -  IMPRESSION: Mild multilevel disc desiccation without significant disc height loss. Tiny central disc protrusions at C6-C7 and C7-T1. No significant spinal canal or foraminal stenosis.  Scattered paranasal sinus mucosal thickening, partially imaged.  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   Pre-diabetes 03/02/2020   Vitamin D deficiency 03/02/2020   Cervicalgia 12/23/2019   Fibroids 11/26/2016   Amenorrhea 08/05/2012   Past Medical History:  Diagnosis Date   Allergy    Anemia    Past Surgical History:  Procedure Laterality Date   ABDOMINAL HYSTERECTOMY  11/26/2016   Procedure: HYSTERECTOMY ABDOMINAL;  Surgeon: Marylynn Pearson, MD;  Location: [facility];  Service: Gynecology;;   COLONOSCOPY WITH PROPOFOL N/A 09/22/2020   Procedure: COLONOSCOPY WITH PROPOFOL;  Surgeon: Milus Banister, MD;  Location: [facility];  Service: Endoscopy;  Laterality: N/A;   NO PAST SURGERIES     POLYPECTOMY  09/22/2020   Procedure: POLYPECTOMY;  Surgeon: Milus Banister, MD;  Location: [facility];  Service: Endoscopy;;   No Known Allergies Prior to Admission medications   Medication Sig Start Date End Date Taking? Authorizing Provider  fluticasone (FLONASE) 50 MCG/ACT nasal spray Place 2 sprays into both nostrils daily. 05/04/21  Yes Wendie Agreste, MD  meloxicam (MOBIC) 15 MG tablet Take 1 tablet (15 mg total) by mouth daily. Patient not taking: Reported on 04/19/2022 10/18/21   Midge Minium, MD  methocarbamol (ROBAXIN) 500 MG tablet Take 1 tablet (500 mg total) by mouth every 8 (eight) hours as needed for muscle spasms. Patient not taking: Reported on 12/18/2021 10/18/21   Midge Minium, MD   Social History   Socioeconomic History   Marital status: Single    Spouse name: Not on file   Number  of children: 3   Years of education: 12   Highest education level: Not on file  Occupational History   Occupation: [employer]    Employer: [employer]  Tobacco Use   Smoking status: Never   Smokeless tobacco: Never  Vaping Use   Vaping Use: Never used  Substance and Sexual Activity   Alcohol use: No   Drug use: No   Sexual activity: Not Currently  Other Topics Concern   Not on file  Social History Narrative   Right handed   Social Determinants of  Health   Financial Resource Strain: Not on file  Food Insecurity: Not on file  Transportation Needs: Not on file  Physical Activity: Not on file  Stress: Not on file  Social Connections: Not on file  Intimate Partner Violence: Not on file   Review of Systems Per HPI.   Objective:   Vitals:   05/03/22 1452  BP: 112/84  Pulse: 66  SpO2: 98%  Weight: 154 lb (69.9 kg)  Height: '5\' 2"'$  (1.575 m)     Physical Exam Vitals reviewed.  Constitutional:      Appearance: Normal appearance. She is well-developed.  HENT:     Head: Normocephalic and atraumatic.     Nose: No congestion or rhinorrhea.     Comments: Small dried blood on the left nasal septum with slight irritation of the area, no mass appreciated, no active bleeding. Eyes:     Conjunctiva/sclera: Conjunctivae normal.     Pupils: Pupils are equal, round, and reactive to light.  Neck:     Vascular: No carotid bruit.  Cardiovascular:     Rate and Rhythm: Normal rate and regular rhythm.     Heart sounds: Normal heart sounds.  Pulmonary:     Effort: Pulmonary effort is normal. No respiratory distress.     Breath sounds: Normal breath sounds. No wheezing, rhonchi or rales.  Abdominal:     Palpations: Abdomen is soft. There is no pulsatile mass.     Tenderness: There is no abdominal tenderness.  Musculoskeletal:     Right lower leg: No edema.     Left lower leg: No edema.  Skin:    General: Skin is warm and dry.  Neurological:     Mental Status: She is alert and oriented to person, place, and time.  Psychiatric:        Mood and Affect: Mood normal.        Behavior: Behavior normal.        Assessment & Plan:  Paula [name] is a 52 y.o. female . Bleeding from nasopharynx  -Discussed plan from ENT and last visit again.  Clarified plan and understanding.  Recommended saline nasal spray, specific name given.  Vaseline to irritated area.  Call ENT if persistent with RTC/ER precautions.  Cervicalgia - Plan:  methocarbamol (ROBAXIN) 500 MG tablet, Ambulatory referral to Spine Surgery  -Would consider PT again, but may be best to see neck specialist again to help guide care and decision on further imaging/treatment -recent MRI as above. Short-term muscle relaxant provided for now with potential side effects discussed.  Hemoptysis - Plan: Ambulatory referral to Pulmonology  -Persistent intermittent hemoptysis.  Likely related to bleeding from nasopharynx with postnasal drip, but with persistent symptoms we will have pulmonary eval and decide on further evaluation.  Again ENT follow-up as above may be helpful as may need laryngoscopy.  Instructions for chest x-ray and location of imaging discussed.  No orders of the defined types were  placed in this encounter.  Patient Instructions  Use saline nasal spray - Simply saline can be purchased over the counter without a prescription.  Call your previous ear and nose specialist if nosebleeds are not improving in next 1-2 weeks.  Return to the clinic or go to the nearest emergency room if any of your symptoms worsen or new symptoms occur.  Walk in for Chest xray at [address], [place].  I will refer you to lung specialist to discuss the blood in phlegm.   I will refer you to a neck specialist, okay to try a muscle relaxant temporarily.  Keep appointment for ultrasound as planned.    Skotnicki, Mirian Mo, DO  [address]  [place]  [place], [place] [address]  Phone: [phone]  Fax: [phone]     Signed,   Merri Ray, MD [place] 05/03/22 3:53 PM

## 2022-05-03 NOTE — Patient Instructions (Addendum)
Use saline nasal spray - Simply saline can be purchased over the counter without a prescription.  Call your previous ear and nose specialist if nosebleeds are not improving in next 1-2 weeks.  Return to the clinic or go to the nearest emergency room if any of your symptoms worsen or new symptoms occur.  Walk in for Chest xray at [address], [place].  I will refer you to lung specialist to discuss the blood in phlegm.   I will refer you to a neck specialist, okay to try a muscle relaxant temporarily.  Keep appointment for ultrasound as planned.    Skotnicki, Mirian Mo, DO  [address]  [place]  [place], [place] [address]  Phone: [phone]  Fax: [phone]

## 2022-05-07 ENCOUNTER — Ambulatory Visit
Admission: RE | Admit: 2022-05-07 | Discharge: 2022-05-07 | Disposition: A | Discharge status: Home / Self Care | Payer: BC Managed Care – PPO | Source: Ambulatory Visit | Attending: Family Medicine | Admitting: Family Medicine

## 2022-05-07 DIAGNOSIS — R042 Hemoptysis: Secondary | ICD-10-CM

## 2022-05-21 ENCOUNTER — Ambulatory Visit
Admission: RE | Admit: 2022-05-21 | Discharge: 2022-05-21 | Disposition: A | Discharge status: Home / Self Care | Payer: BC Managed Care – PPO | Source: Ambulatory Visit | Attending: Family Medicine | Admitting: Family Medicine

## 2022-05-21 DIAGNOSIS — M542 Cervicalgia: Secondary | ICD-10-CM

## 2022-05-22 ENCOUNTER — Ambulatory Visit (INDEPENDENT_AMBULATORY_CARE_PROVIDER_SITE_OTHER): Payer: BC Managed Care – PPO | Admitting: Pulmonary Disease

## 2022-05-22 ENCOUNTER — Encounter: Payer: Self-pay | Admitting: Pulmonary Disease

## 2022-05-22 VITALS — BP 118/76 | HR 81 | Ht 63.0 in | Wt 156.8 lb

## 2022-05-22 DIAGNOSIS — R042 Hemoptysis: Secondary | ICD-10-CM | POA: Diagnosis not present

## 2022-05-22 NOTE — Progress Notes (Signed)
$'@Patient'A$  ID: Paula [name], female    DOB: 26-Jul-1970, 52 y.o.   [name]: [id number]  Chief Complaint  Patient presents with   Consult    Pt is here for consult for coughing up some blood. Pt states it has been occurring for about 6 months now. And she states it is noted a little bit in her mucus she coughs up.     Referring provider: Wendie Agreste, MD  HPI:   52 y.o. whom we are seeing for evaluation of hemoptysis.  Most recent PCP note x 2 reviewed.  Patient associated with sinus congestion, headaches, pressure for some time.  Worse over the last several months.  Associated with rhinorrhea.  With rhinorrhea often streaked with blood and sometimes frank nosebleeds.  She thinks due to dry nose.  Frequent blowing of nose.  She is on Flonase and azelastine for her rhinorrhea, sinus congestion.  Helped some she thinks.  Also try to use Vaseline to keep nasal passages moisturized where she can.  Associated with onset of the nosebleeds is hemoptysis.  Often blood-streaked sputum.  Sometimes a bit more blood.  Was not happening prior to current issues.  Review chest x-ray 05/07/22, on my review and interpretation reveals clear lungs bilaterally, no obvious culprit for hemoptysis from the lung.  PMH: Seasonal allergies Surgical history: Fibroid surgery Family history: Denies significant respiratory illness in 11 relatives Social history: Never smoker, lives in [employer] / Pulmonary Flowsheets:   ACT:      No data to display          MMRC:     No data to display          Epworth:      No data to display          Tests:   FENO:  No results found for: "NITRICOXIDE"  PFT:     No data to display          WALK:      No data to display          Imaging: Personally reviewed and as per EMR and discussion in this note US THYROID  Result Date: 05/21/2022 CLINICAL DATA:  Palpable abnormality. Enlarged neck with tenderness to palpation.  EXAM: THYROID ULTRASOUND TECHNIQUE: Ultrasound examination of the thyroid gland and adjacent soft tissues was performed. COMPARISON:  None Available. FINDINGS: Parenchymal Echotexture: Normal Isthmus: 0.4 cm Right lobe: 3.9 x 1.0 x 1.5 cm Left lobe: 3.9 x 1.1 x 1.3 cm _________________________________________________________ Estimated total number of nodules >/= 1 cm: 0 Number of spongiform nodules >/=  2 cm not described below (TR1): 0 Number of mixed cystic and solid nodules >/= 1.5 cm not described below (TR2): 0 _________________________________________________________ No discrete nodules are seen within the thyroid gland. No enlarged or abnormal appearing lymph nodes are identified. IMPRESSION: Normal thyroid ultrasound. The above is in keeping with the ACR TI-RADS recommendations - J Am Coll Radiol 2017;14:587-595. Electronically Signed   By: Aletta Edouard M.D.   On: 05/21/2022 16:50   DG Chest 2 View  Result Date: 05/07/2022 CLINICAL DATA:  cough with intermittent hemoptysis. EXAM: CHEST - 2 VIEW COMPARISON:  Radiograph 03/08/2021 FINDINGS: The heart size and mediastinal contours are within normal limits.No focal airspace disease. No pleural effusion or pneumothorax.No acute osseous abnormality. IMPRESSION: No evidence of acute cardiopulmonary disease. Electronically Signed   By: Maurine Simmering M.D.   On: 05/07/2022 14:17    Lab Results: Personally  reviewed CBC    Component Value Date/Time   WBC 5.5 06/22/2021 1534   RBC 4.51 06/22/2021 1534   HGB 12.9 06/22/2021 1534   HGB 13.9 03/01/2020 1425   HCT 39.3 06/22/2021 1534   HCT 42.8 03/01/2020 1425   PLT 211.0 06/22/2021 1534   PLT 242 03/01/2020 1425   MCV 87.2 06/22/2021 1534   MCV 86 03/01/2020 1425   MCH 27.9 03/01/2020 1425   MCH 22.5 (L) 11/27/2016 0555   MCHC 32.8 06/22/2021 1534   RDW 13.0 06/22/2021 1534   RDW 12.3 03/01/2020 1425   LYMPHSABS 1.8 06/22/2021 1534   LYMPHSABS 1.8 03/01/2020 1425   MONOABS 0.4 06/22/2021 1534    EOSABS 0.3 06/22/2021 1534   EOSABS 0.1 03/01/2020 1425   BASOSABS 0.1 06/22/2021 1534   BASOSABS 0.1 03/01/2020 1425    BMET    Component Value Date/Time   NA 137 06/22/2021 1534   NA 140 03/01/2020 1425   K 3.7 06/22/2021 1534   CL 104 06/22/2021 1534   CO2 28 06/22/2021 1534   GLUCOSE 78 06/22/2021 1534   BUN 9 06/22/2021 1534   BUN 9 03/01/2020 1425   CREATININE 0.65 06/22/2021 1534   CREATININE 0.57 06/22/2015 1115   CALCIUM 9.0 06/22/2021 1534   GFRNONAA 108 03/01/2020 1425   GFRNONAA >89 10/03/2013 1133   GFRAA 125 03/01/2020 1425   GFRAA >89 10/03/2013 1133    BNP No results found for: "BNP"  ProBNP No results found for: "PROBNP"  Specialty Problems       Pulmonary Problems   Difficult intubation    No Known Allergies  Immunization History  Administered Date(s) Administered   Influenza,inj,Quad PF,6+ Mos 04/06/2016, 11/27/2016, 01/20/2018, 12/23/2019, 05/04/2021   Tdap 01/28/2017    Past Medical History:  Diagnosis Date   Allergy    Anemia     Tobacco History: Social History   Tobacco Use  Smoking Status Never  Smokeless Tobacco Never   Counseling given: Not Answered   Continue to not smoke  Outpatient Encounter Medications as of 05/22/2022  Medication Sig   fluticasone (FLONASE) 50 MCG/ACT nasal spray Place 2 sprays into both nostrils daily.   meloxicam (MOBIC) 15 MG tablet Take 1 tablet (15 mg total) by mouth daily.   methocarbamol (ROBAXIN) 500 MG tablet Take 1 tablet (500 mg total) by mouth every 8 (eight) hours as needed for muscle spasms.   No facility-administered encounter medications on file as of 05/22/2022.     Review of Systems  Review of Systems  No chest pain with exertion.  No orthopnea or PND.  Comprehensive review of systems otherwise negative. Physical Exam  Pulse 81   Ht '5\' 3"'$  (1.6 m)   Wt 156 lb 12.8 oz (71.1 kg)   LMP 11/26/2016   SpO2 97%   BMI 27.78 kg/m   Wt Readings from Last 5 Encounters:   05/22/22 156 lb 12.8 oz (71.1 kg)  05/03/22 154 lb (69.9 kg)  04/19/22 153 lb 12.8 oz (69.8 kg)  12/18/21 153 lb 9.6 oz (69.7 kg)  10/18/21 148 lb 3.2 oz (67.2 kg)    BMI Readings from Last 5 Encounters:  05/22/22 27.78 kg/m  05/03/22 28.17 kg/m  04/19/22 28.13 kg/m  12/18/21 28.09 kg/m  10/18/21 26.25 kg/m     Physical Exam General: Well-appearing, in no acute distress Eyes: EOMI, no icterus Neck: Supple, no JVP Pulmonary: Clear, normal work of breathing Cardiovascular: Warm, no edema Abdomen: Nondistended, bowel sounds present MSK: No  synovitis, no joint effusion Neuro: Normal gait, no weakness Psych: Normal mood, full affect   Assessment & Plan:   Hemoptysis: Present for a few months.  Intermittent.  Usually tracks or coincides with bloody nose.  Suspect postnasal drip of blood and expectorated sputum due to that.  Will obtain CT scan chest without contrast for further evaluation or  rule out sinister or other causes in the lung that can result in hemoptysis.   Return if symptoms worsen or fail to improve.   Lanier Clam, MD 05/22/2022

## 2022-05-22 NOTE — Patient Instructions (Signed)
Nice to meet you  I think the blood that you call follow-up is likely from the nose that drips down the back of the throat.  However, I think we should get a CT scan of the chest to make sure there is not anything else that could be producing blood in the lungs and causing the coughing up of blood.  I will look for a CT scan to be done and the results to come to me.  If there is anything we need to do differently, I will let you know and arrange the appropriate follow-up.  Otherwise, return to clinic as needed

## 2022-06-12 ENCOUNTER — Other Ambulatory Visit: Payer: Self-pay | Admitting: Family Medicine

## 2022-06-12 DIAGNOSIS — J309 Allergic rhinitis, unspecified: Secondary | ICD-10-CM

## 2022-06-14 ENCOUNTER — Encounter: Payer: BC Managed Care – PPO | Admitting: Neurology

## 2022-06-20 ENCOUNTER — Ambulatory Visit
Admission: RE | Admit: 2022-06-20 | Discharge: 2022-06-20 | Disposition: A | Discharge status: Home / Self Care | Payer: BC Managed Care – PPO | Source: Ambulatory Visit | Attending: Pulmonary Disease | Admitting: Pulmonary Disease

## 2022-06-20 DIAGNOSIS — R042 Hemoptysis: Secondary | ICD-10-CM

## 2022-06-25 NOTE — Progress Notes (Signed)
CT scan looks good no further imaging needed per radiologist.

## 2022-07-14 IMAGING — MR MR CERVICAL SPINE W/O CM
4 of 5 series · 28 of 48 positions shown · non-contrast
Comparison: Radiographs of the cervical spine 03/17/2020.

CLINICAL DATA: Neck pain. Neck pain, chronic, degenerative changes
on x-ray; worsening neck pain and occipital headaches. Failed
multiple modes of conservative treatment.

EXAM:
MRI CERVICAL SPINE WITHOUT CONTRAST
TECHNIQUE: Multiplanar, multisequence MR imaging of the cervical spine was
performed. No intravenous contrast was administered.

[Series 5: T2 · sagittal · 3.0mm · 0.66mm/px · 6 of 15 slices shown (1 of 2)]
[im 1/15]
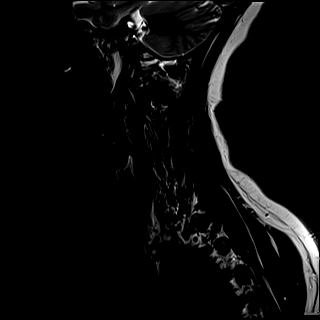
[im 3/15]
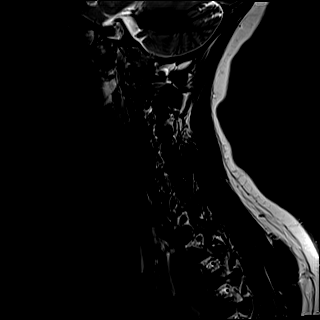
[im 6/15]
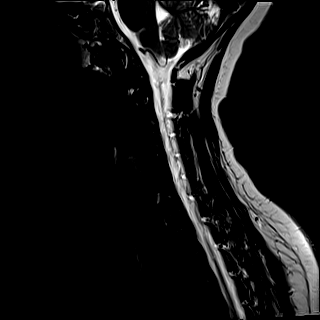
[im 9/15]
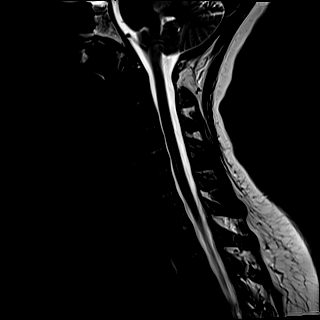
[im 12/15]
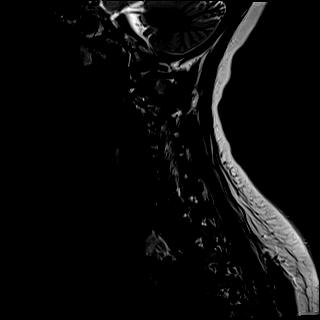
[im 15/15]
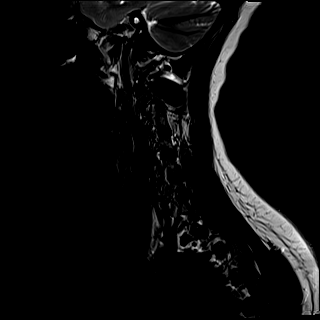

[Series 6: T1 · sagittal · 3.0mm · 0.66mm/px · 7 of 15 slices shown]
[im 1/15]
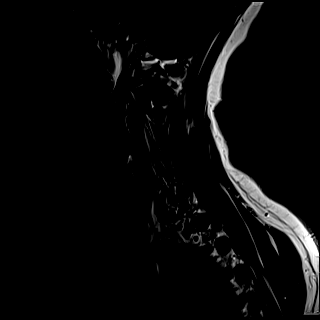
[im 3/15]
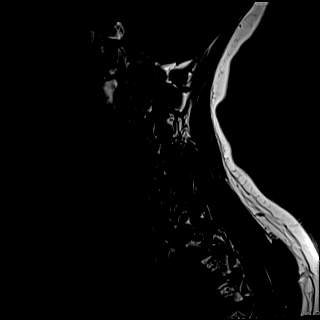
[im 5/15]
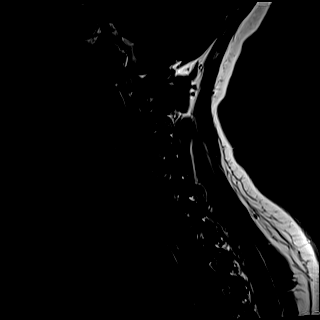
[im 8/15]
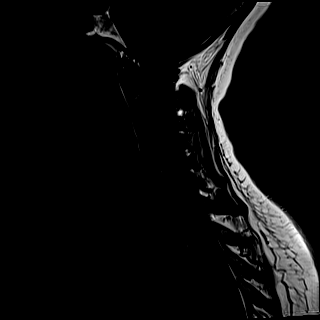
[im 10/15]
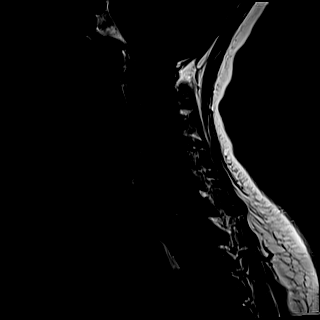
[im 12/15]
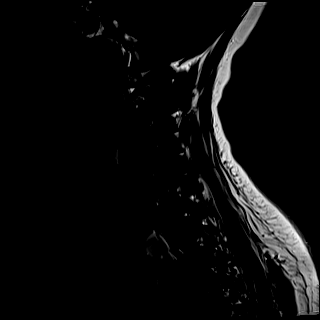
[im 15/15]
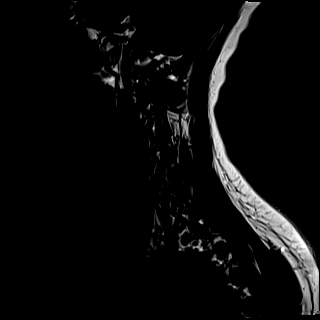

[Series 7: STIR · sagittal · 3.0mm · 0.33mm/px · 7 of 15 slices shown]
[im 1/15]
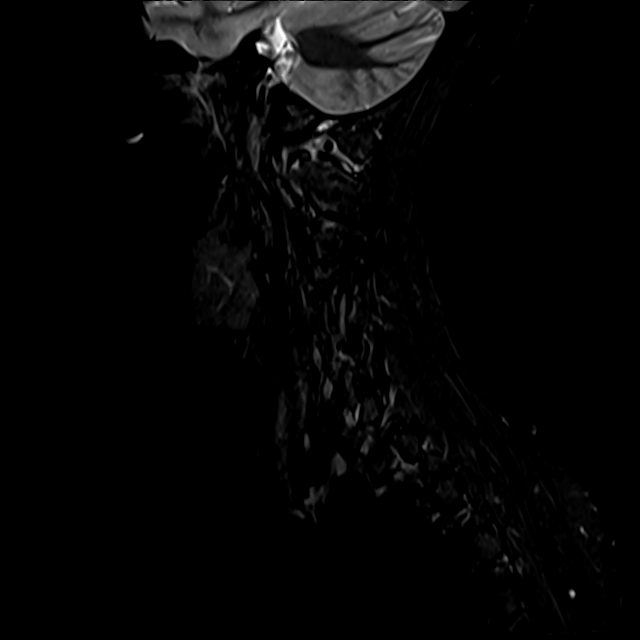
[im 3/15]
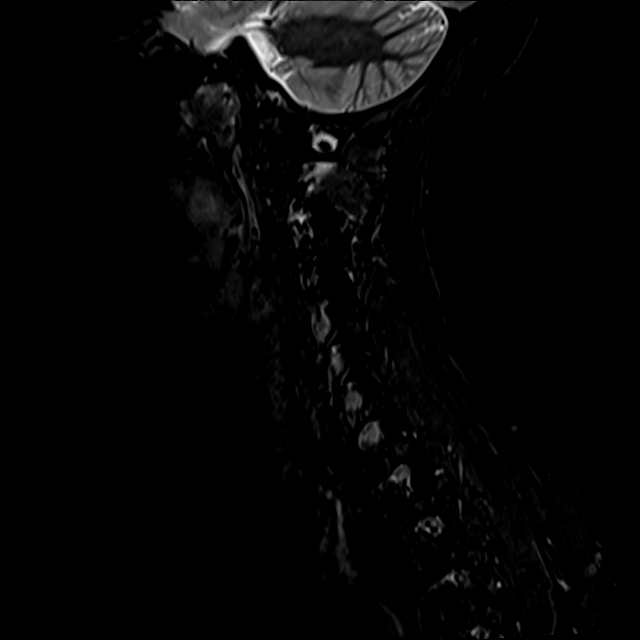
[im 5/15]
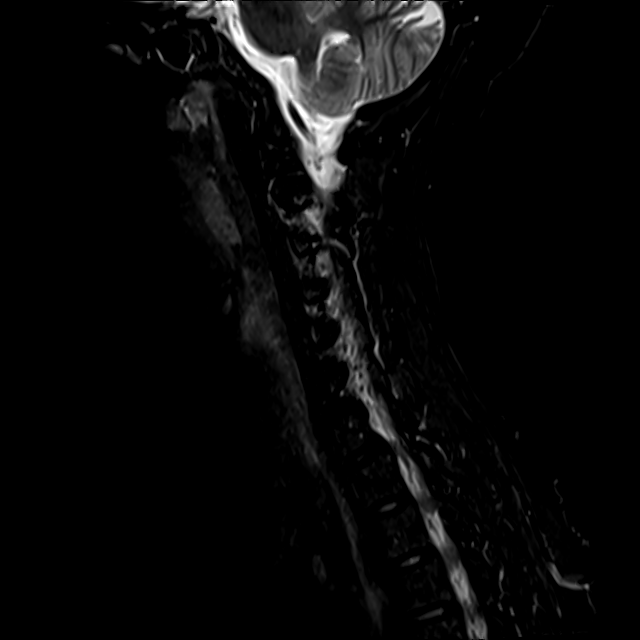
[im 8/15]
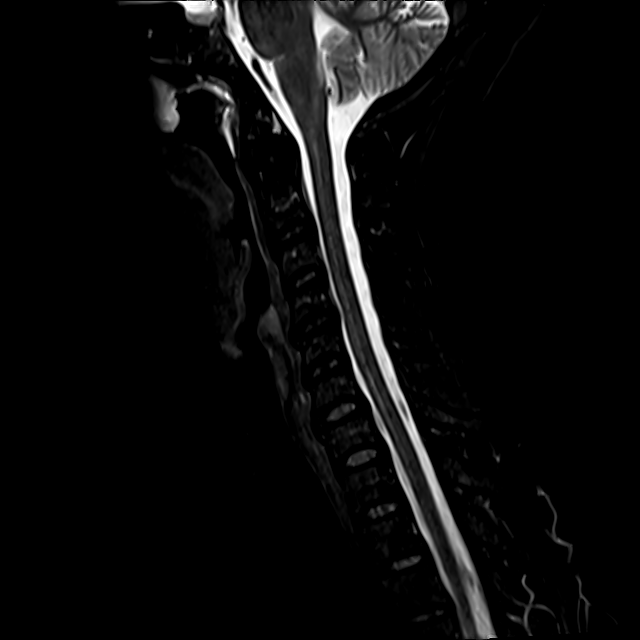
[im 10/15]
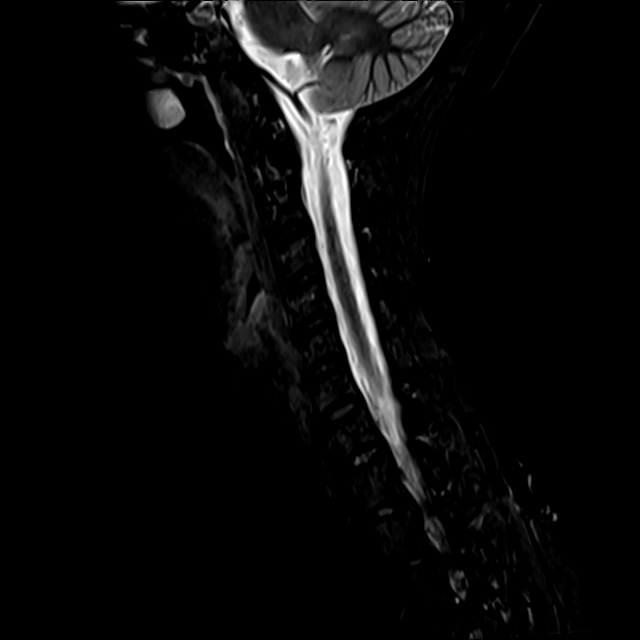
[im 12/15]
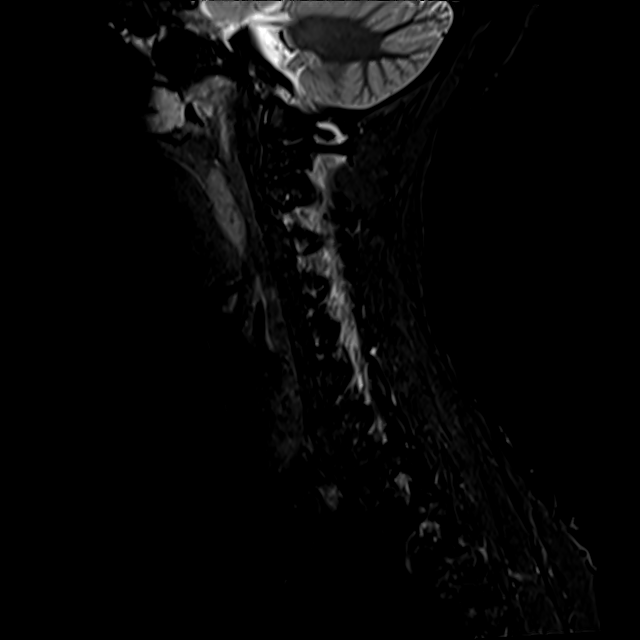
[im 15/15]
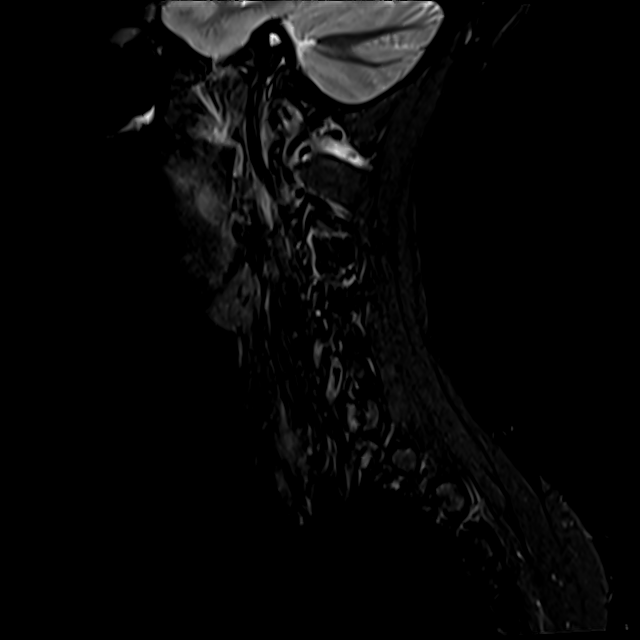

[Series 8: T2 · axial · 3.0mm · 0.50mm/px · z∈[-61,+31]mm · 8 of 30 slices shown (2 of 2)]
[im 1/30]
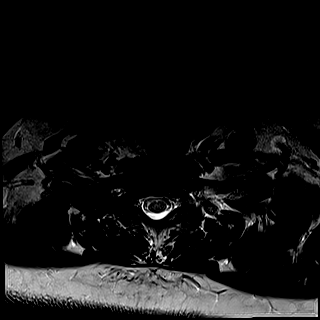
[im 5/30]
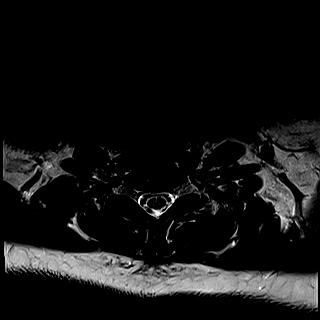
[im 9/30]
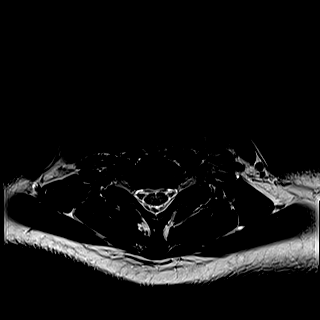
[im 14/30]
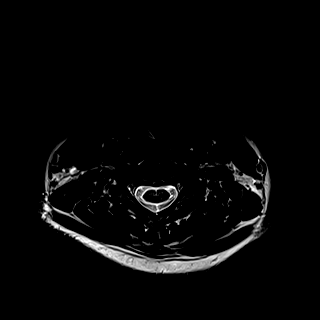
[im 16/30]
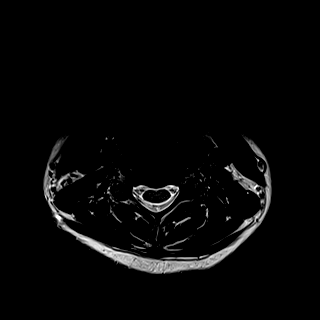
[im 21/30]
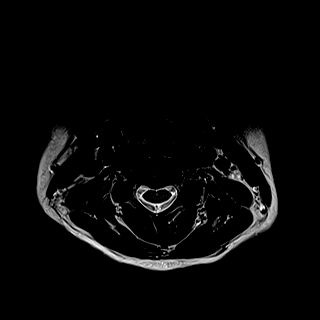
[im 25/30]
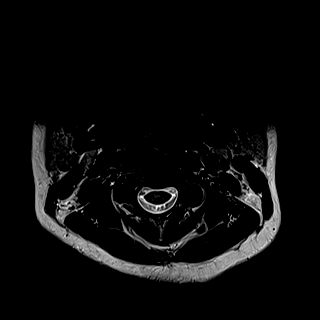
[im 30/30]
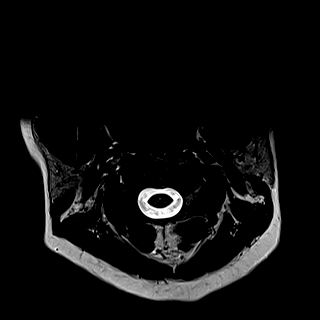

[28 of 48 positions shown; findings below may reference images not displayed]

FINDINGS: Alignment: Straightening of the expected cervical lordosis. No
significant spondylolisthesis.

Vertebrae: Vertebral body height is maintained. No significant
marrow edema or focal suspicious osseous lesion.

Cord: No spinal cord signal abnormality is identified.

Posterior Fossa, vertebral arteries, paraspinal tissues: No
abnormality is identified within included portions of the posterior
fossa. Flow voids preserved within the imaged cervical vertebral
arteries. Paraspinal soft tissues within normal limits. Mild
scattered paranasal sinus mucosal thickening, incompletely imaged.

Disc levels:

There is mild multilevel disc desiccation. However, there is no
significant disc height loss within the cervical spine.

C2-C3: No significant disc herniation or stenosis.

C3-C4: No significant disc herniation or stenosis.

C4-C5: No significant disc herniation or stenosis.

C5-C6: No significant disc herniation or stenosis.

C6-C7: Tiny central disc protrusion (series 9, image 23). No
significant spinal canal or foraminal stenosis.

C7-T1: Tiny central disc protrusion (series 9, image 27). No
significant spinal canal or foraminal stenosis.
IMPRESSION: Mild multilevel disc desiccation without significant disc height
loss. Tiny central disc protrusions at C6-C7 and C7-T1. No
significant spinal canal or foraminal stenosis.

Scattered paranasal sinus mucosal thickening, partially imaged.

## 2022-07-20 NOTE — Therapy (Unsigned)
OUTPATIENT PHYSICAL THERAPY CERVICAL EVALUATION   Patient Name: Paula [name] [name]: [id number] DOB:01-16-1971, 52 y.o., female Today's Date: 07/23/2022  END OF SESSION:  PT End of Session - 07/23/22 1414     Visit Number 1    Number of Visits 12    Date for PT Re-Evaluation 09/03/22    Authorization Type [employer] State    PT Start Time 1415    PT Stop Time 1500    PT Time Calculation (min) 45 min    Activity Tolerance Patient tolerated treatment well    Behavior During Therapy [facility] for tasks assessed/performed             Past Medical History:  Diagnosis Date   Allergy    Anemia    Past Surgical History:  Procedure Laterality Date   ABDOMINAL HYSTERECTOMY  11/26/2016   Procedure: HYSTERECTOMY ABDOMINAL;  Surgeon: Zelphia Cairo, MD;  Location: [facility];  Service: Gynecology;;   COLONOSCOPY WITH PROPOFOL N/A 09/22/2020   Procedure: COLONOSCOPY WITH PROPOFOL;  Surgeon: Rachael Fee, MD;  Location: [facility];  Service: Endoscopy;  Laterality: N/A;   NO PAST SURGERIES     POLYPECTOMY  09/22/2020   Procedure: POLYPECTOMY;  Surgeon: Rachael Fee, MD;  Location: [facility];  Service: Endoscopy;;   Patient Active Problem List   Diagnosis Date Noted   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   Pre-diabetes 03/02/2020   Vitamin D deficiency 03/02/2020   Cervicalgia 12/23/2019   Fibroids 11/26/2016   Amenorrhea 08/05/2012    PCP: Dr. Meredith Staggers  REFERRING PROVIDER: Dr. Meredith Staggers   REFERRING DIAG: M54.2 (ICD-10-CM) - Cervicalgia  THERAPY DIAG:  Cervicalgia - Plan: PT plan of care cert/re-cert  Joint stiffness of spine - Plan: PT plan of care cert/re-cert  Other low back pain - Plan: PT plan of care cert/re-cert  Rationale for Evaluation and Treatment: Rehabilitation  ONSET DATE: chronic  SUBJECTIVE:                                                                                                                                                                                                          SUBJECTIVE STATEMENT: Patient has had neck pain for years.  This referral was placed by Dr. Franky Macho and she requested physical therapy.  She saw her primary today who recommended physical therapy for her low back she would like to do both.  The lower back pain is similar to something she is having before. 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 She has pain even when she is not working,  occasionally she will have pain with walking or when standing from a seated position.  Yesterday she reports the pain is 10 out of 10 in her right hip/low back. She has difficulty sleeping,hard to get comfortable.  The pain does not radiate and she denies sensory changes, weakness.  The pain does not radiate (upper or lower) If she stands for 1 hour she will have low back pain.  She likes to walk, does not exercise beyond that.  Her job involves pushing, mopping /vacuuming offices and bathroom.    Hand dominance: Right  PERTINENT HISTORY:  Not pertinent   PAIN:  Are you having pain? Yes: NPRS scale: 7/10 Pain location: neck  Pain description: tight Aggravating factors: constant  Relieving factors: medicine, massage   Yes: NPRS scale: 5/10 Pain location: Rt lateral hip, low back Pain description: stabbing  Aggravating factors: sitting too long, getting up, getting out of bed , walking down stairs  Relieving factors: meds, rest   PRECAUTIONS: None  WEIGHT BEARING RESTRICTIONS: No  FALLS:  Has patient fallen in last 6 months? No  LIVING ENVIRONMENT: Lives with: lives with their familysons x 2 > 18 yrs and spouse  Lives in: House/apartment Stairs: Yes: Internal: 8 steps; on right going up Has following equipment at home: None  OCCUPATION: working full time [employer]  PLOF: Independent, Independent with household mobility without device, Independent with community mobility without device, Vocation/Vocational requirements: physical, lifting  and pushing , and Leisure: being with family   PATIENT GOALS: I want to be able to have less pain /   NEXT MD VISIT: unknown   OBJECTIVE:   DIAGNOSTIC FINDINGS:  2022 MRI: Mild multilevel disc desiccation without significant disc height loss. Tiny central disc protrusions at C6-C7 and C7-T1. No significant spinal canal or foraminal stenosis  Imaging 04/06/16 - lumbar spine XR: FINDINGS: There is no evidence of fracture or subluxation. Vertebral bodies demonstrate normal height and alignment. Intervertebral disc spaces are preserved. The visualized neural foramina are grossly unremarkable in appearance.   PATIENT SURVEYS:  FOTO 48% lumbar none for neck   COGNITION: Overall cognitive status: Within functional limits for tasks assessed  SENSATION: WFL  POSTURE: rounded shoulders and forward head  PALPATION: Stiffness and pain felt throughout cervical spine with palpation.  Hypertonic along posterior lateral cervical and pain along suboccipitals.  Stiff with PA mobs in cervicothoracic junction.   CERVICAL ROM:   Active ROM A/PROM (deg) eval  Flexion 60 pain   Extension 35  Right lateral flexion 36 pain   Left lateral flexion 35 pain  Right rotation Tight approx 50  Left rotation Tight approx 50    (Blank rows = not tested) TRUNK ROM    Lumbar flexion touch toes , ext 50% , rotation WFL and lateral flexion WNL  UPPER EXTREMITY ROM: WFLs  Active ROM Right eval Left eval  Shoulder flexion    Shoulder extension    Shoulder abduction    Shoulder adduction    Shoulder extension    Shoulder internal rotation    Shoulder external rotation    Elbow flexion    Elbow extension    Wrist flexion    Wrist extension    Wrist ulnar deviation    Wrist radial deviation    Wrist pronation    Wrist supination     (Blank rows = not tested)  UPPER EXTREMITY MMT: WFLs  MMT Right eval Left eval  Shoulder flexion    Shoulder extension  Shoulder abduction    Shoulder  adduction    Shoulder extension    Shoulder internal rotation    Shoulder external rotation    Middle trapezius    Lower trapezius    Elbow flexion    Elbow extension    Wrist flexion    Wrist extension    Wrist ulnar deviation    Wrist radial deviation    Wrist pronation    Wrist supination    Grip strength     (Blank rows = not tested)  CERVICAL SPECIAL TESTS:  Neck flexor muscle endurance test: 15 sec  and Distraction test: improved pain   FUNCTIONAL TESTS:  90/90 hold isometric static with chin tuck 15 sec , back strain , loss of neutral   TODAY'S TREATMENT:                                                                                                                              DATE: 07/23/22   PATIENT EDUCATION:  Education details: posture, HEP, core , DN  Person educated: Patient Education method: Explanation, Demonstration, and Handouts Education comprehension: verbalized understanding, returned demonstration, and needs further education  HOME EXERCISE PROGRAM: Access Code: [email or web address] URL: [email or web address] Date: 07/23/2022 Prepared by: Karie Mainland  Exercises - Standing Cervical Retraction with Sidebending  - 1 x daily - 7 x weekly - 2 sets - 10 reps - 15-20 hold - Standing Cervical Rotation AROM with Overpressure  - 1 x daily - 7 x weekly - 2 sets - 10 reps - 15-20 hold - Supine Cervical Retraction with Towel  - 1 x daily - 7 x weekly - 2 sets - 10 reps - 5 hold - Standing Scapular Retraction  - 1 x daily - 7 x weekly - 2 sets - 10 reps - 5 hold - Correct Seated Posture  - 1-5 x daily - 7 x weekly - 1 sets  ASSESSMENT:  CLINICAL IMPRESSION: Patient is a 52 y.o. female who was seen today for physical therapy evaluation and treatment for neck pain. She saw her primary today and he also referred her for low back pain .  Focused on neck but will include both areas of the spine. Her pain seems musculoskeletal and not worrisome for radiculopathy.   She will benefit from skilled PT to improve her mobility and relief pain to improve quality of life.   OBJECTIVE IMPAIRMENTS: decreased mobility, difficulty walking, decreased ROM, decreased strength, hypomobility, increased fascial restrictions, increased muscle spasms, impaired flexibility, postural dysfunction, and pain.   ACTIVITY LIMITATIONS: carrying, lifting, bending, sitting, squatting, sleeping, stairs, bed mobility, reach over head, locomotion level, and caring for others  PARTICIPATION LIMITATIONS: meal prep, cleaning, laundry, community activity, and occupation  PERSONAL FACTORS: Profession and 1 comorbidity: chronic back and neck pain  are also affecting patient's functional outcome.   REHAB POTENTIAL: Excellent  CLINICAL DECISION MAKING: Stable/uncomplicated  EVALUATION COMPLEXITY: Low   GOALS:  LONG TERM GOALS: Target  date: 09/03/2022    Patient will be independent with home exercise program for neck and low back including posture and core Baseline:  Goal status: INITIAL  2.  Patient will be able to have no neck pain with normal ADLs Baseline:  Goal status: INITIAL  3.  Patient will notice improved ability to go up and down stairs in the morning due to decreased right sided back pain Baseline:  Goal status: INITIAL  4.  Patient will no longer have right-sided low back pain when transferring sit to stand or in bed Baseline:  Goal status: INITIAL  5.  Patient will demonstrate normal cervical active range of motion with stretch only Baseline:  Goal status: INITIAL  6.  Foto score will increase to 63% (lumbar) Baseline: 48% Goal status: INITIAL   PLAN:  PT FREQUENCY: 1-2x/week  PT DURATION: 6 weeks  PLANNED INTERVENTIONS: Therapeutic exercises, Therapeutic activity, Patient/Family education, Self Care, Joint mobilization, Dry Needling, Electrical stimulation, Spinal manipulation, Spinal mobilization, Cryotherapy, Moist heat, Manual therapy, and  Re-evaluation  PLAN FOR NEXT SESSION: Check home exercise program lower body evaluation lumbar. Progress core stability and observe postural faults with functional activity.   Shirle Provencal, PT 07/23/2022, 3:27 PM  Karie Mainland, PT 07/23/22 3:27 PM Phone: [phone] Fax: [phone]

## 2022-07-23 ENCOUNTER — Ambulatory Visit: Payer: BC Managed Care – PPO | Attending: Critical Care Medicine | Admitting: Physical Therapy

## 2022-07-23 ENCOUNTER — Encounter: Payer: Self-pay | Admitting: Physical Therapy

## 2022-07-23 ENCOUNTER — Encounter: Payer: Self-pay | Admitting: Family Medicine

## 2022-07-23 ENCOUNTER — Ambulatory Visit (INDEPENDENT_AMBULATORY_CARE_PROVIDER_SITE_OTHER): Payer: BC Managed Care – PPO | Admitting: Family Medicine

## 2022-07-23 VITALS — BP 118/72 | HR 63 | Temp 98.2°F | Resp 16 | Ht 62.0 in | Wt 151.8 lb

## 2022-07-23 DIAGNOSIS — M5459 Other low back pain: Secondary | ICD-10-CM | POA: Diagnosis present

## 2022-07-23 DIAGNOSIS — M545 Low back pain, unspecified: Secondary | ICD-10-CM | POA: Diagnosis not present

## 2022-07-23 DIAGNOSIS — M542 Cervicalgia: Secondary | ICD-10-CM | POA: Diagnosis present

## 2022-07-23 DIAGNOSIS — G8929 Other chronic pain: Secondary | ICD-10-CM

## 2022-07-23 DIAGNOSIS — M2560 Stiffness of unspecified joint, not elsewhere classified: Secondary | ICD-10-CM | POA: Insufficient documentation

## 2022-07-23 MED ORDER — METHOCARBAMOL 500 MG PO TABS: 500.0000 mg | ORAL_TABLET | Freq: Three times a day (TID) | ORAL | 0 refills | Status: DC | PRN

## 2022-07-23 MED ORDER — MELOXICAM 7.5 MG PO TABS: 7.5000 mg | ORAL_TABLET | Freq: Every day | ORAL | 0 refills | Status: DC | PRN

## 2022-07-23 NOTE — Progress Notes (Signed)
Subjective:  Patient ID: Paula [name], female    DOB: 1970-04-17  Age: 52 y.o. [name]: [id number]  CC:  Chief Complaint  Patient presents with   Flank Pain    Right side pain    HPI Latorsha Kogan presents for   R flank pain: Similar pain in lower R back since last year. Saw Dr. Beverely Low in 10/2021.  Occasional jolt of pain with walking or standing up from seated position. Comes and goes.  No dysuria/frequency/hematuria. No f/n/v. Sore in R low back if straining for BM.  Gets better usually, then worse past 2 days. No fall/injury.  Moves into R buttock, not leg.  Better in past when used mm relaxer.  No bowel or bladder incontinence, no saddle anesthesia, no lower extremity weakness. No fever or unexplained weight loss.  Walking daily, no new activities. Housekeeping at [employer].   Imaging 04/06/16 - lumbar spine XR: FINDINGS: There is no evidence of fracture or subluxation. Vertebral bodies demonstrate normal height and alignment. Intervertebral disc spaces are preserved. The visualized neural foramina are grossly unremarkable in appearance.   Currently starting PT at [facility] for neck issues.   She was seen in March by pulmonary for hemoptysis.  Thought to be from postnasal drip or blood in expectorated sputum, CT chest on 06/20/2022,Tiny nodule left upper lobe which did not need follow-up imaging, no acute cardiopulmonary process. 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   Pre-diabetes 03/02/2020   Vitamin D deficiency 03/02/2020   Cervicalgia 12/23/2019   Fibroids 11/26/2016   Amenorrhea 08/05/2012   Past Medical History:  Diagnosis Date   Allergy    Anemia    Past Surgical History:  Procedure Laterality Date   ABDOMINAL HYSTERECTOMY  11/26/2016   Procedure: HYSTERECTOMY ABDOMINAL;  Surgeon: Zelphia Cairo, MD;  Location: [facility];  Service: Gynecology;;   COLONOSCOPY WITH PROPOFOL N/A 09/22/2020   Procedure:  COLONOSCOPY WITH PROPOFOL;  Surgeon: Rachael Fee, MD;  Location: [facility];  Service: Endoscopy;  Laterality: N/A;   NO PAST SURGERIES     POLYPECTOMY  09/22/2020   Procedure: POLYPECTOMY;  Surgeon: Rachael Fee, MD;  Location: [facility];  Service: Endoscopy;;   No Known Allergies Prior to Admission medications   Medication Sig Start Date End Date Taking? Authorizing Provider  fluticasone (FLONASE) 50 MCG/ACT nasal spray Place 2 sprays into both nostrils daily. 05/04/21  Yes Shade Flood, MD  meloxicam (MOBIC) 15 MG tablet Take 1 tablet (15 mg total) by mouth daily. 10/18/21  Yes Sheliah Hatch, MD  methocarbamol (ROBAXIN) 500 MG tablet Take 1 tablet (500 mg total) by mouth every 8 (eight) hours as needed for muscle spasms. 05/03/22  Yes Shade Flood, MD   Social History   Socioeconomic History   Marital status: Single    Spouse name: Not on file   Number of children: 3   Years of education: 12   Highest education level: Not on file  Occupational History   Occupation: [employer]    Employer: [employer]  Tobacco Use   Smoking status: Never   Smokeless tobacco: Never  Vaping Use   Vaping Use: Never used  Substance and Sexual Activity   Alcohol use: No   Drug use: No   Sexual activity: Not Currently  Other Topics Concern   Not on file  Social History Narrative   Right handed   Social Determinants of Corporate investment banker  Strain: Not on file  Food Insecurity: Not on file  Transportation Needs: Not on file  Physical Activity: Not on file  Stress: Not on file  Social Connections: Not on file  Intimate Partner Violence: Not on file    Review of Systems   Objective:   Vitals:   07/23/22 1158  BP: 118/72  Pulse: 63  Resp: 16  Temp: 98.2 F (36.8 C)  TempSrc: Oral  SpO2: 98%  Weight: 151 lb 12.8 oz (68.9 kg)  Height: 5\' 2"  (1.575 m)     Physical Exam Vitals reviewed.  Constitutional:      General: She is not in acute distress.     Appearance: Normal appearance. She is well-developed.  HENT:     Head: Normocephalic and atraumatic.  Cardiovascular:     Rate and Rhythm: Normal rate.  Pulmonary:     Effort: Pulmonary effort is normal.  Abdominal:     Tenderness: There is no right CVA tenderness or left CVA tenderness.  Musculoskeletal:     Comments: No midline bony tenderness.  Area of discomfort reportedly at the right paraspinals just above the SI joint.  Negative seated straight leg raise.  Able to heel and toe walk without difficulty, intact range of motion.  Neurological:     Mental Status: She is alert and oriented to person, place, and time.  Psychiatric:        Mood and Affect: Mood normal.        Assessment & Plan:  Paula [name] is a 52 y.o. female . Chronic right-sided low back pain without sciatica - Plan: Ambulatory referral to Physical Therapy, meloxicam (MOBIC) 7.5 MG tablet, methocarbamol (ROBAXIN) 500 MG tablet  -Recurrent episodes of right low back pain, suspected spasm versus intermittent renal impingement.  No radicular symptoms.  Reassuring exam at present.  Trial of physical therapy, short-term meloxicam for now with recent flare of symptoms and muscle relaxant as needed with side effects discussed.  Recheck 1 month  Meds ordered this encounter  Medications   meloxicam (MOBIC) 7.5 MG tablet    Sig: Take 1 tablet (7.5 mg total) by mouth daily as needed for pain.    Dispense:  30 tablet    Refill:  0   methocarbamol (ROBAXIN) 500 MG tablet    Sig: Take 1 tablet (500 mg total) by mouth every 8 (eight) hours as needed for muscle spasms.    Dispense:  45 tablet    Refill:  0   Patient Instructions  I will refer you to physical therapy for your back.  Meloxicam once per day as needed for now for the soreness.  Do not combine that with over-the-counter medicines like Advil or Aleve but Tylenol is okay.  Muscle relaxant can be taken up to 3 times per day but I would start with that at bedtime if  needed for back pain or muscle spasm.  See information below.  Recheck with me in 1 month, sooner if worse.   Chronic Back Pain Chronic back pain is back pain that lasts longer than 3 months. The cause of your back pain may not be known. Some common causes include: Wear and tear (degenerative disease) of the bones, disks, or tissues that connect bones to each other (ligaments) in your back. Inflammation and stiffness in your back (arthritis). If you have chronic back pain, you may have times when the pain is more intense (flare-ups). You can also learn to manage the pain with home care. Follow  these instructions at home: Watch for any changes in your symptoms. Take these actions to help with your pain: Managing pain and stiffness     If told, put ice on the painful area. You may be told to apply ice for the first 24-48 hours after a flare-up starts. Put ice in a plastic bag. Place a towel between your skin and the bag. Leave the ice on for 20 minutes, 2-3 times per day. If told, apply heat to the affected area as often as told by your health care provider. Use the heat source that your provider recommends, such as a moist heat pack or a heating pad. Place a towel between your skin and the heat source. Leave the heat on for 20-30 minutes. If your skin turns bright red, remove the ice or heat right away to prevent skin damage. The risk of damage is higher if you cannot feel pain, heat, or cold. Try soaking in a warm tub. Activity        Avoid bending and other activities that make the pain worse. Have good posture when you stand or sit. When you stand, keep your upper back and neck straight, with your shoulders pulled back. Avoid slouching. When you sit, keep your back straight. Relax your shoulders. Do not round your shoulders or pull them backward. Do not sit or stand in one place for too long. Take brief periods of rest during the day. This will reduce your pain. Resting in a lying  or standing position is often better than sitting to rest. When you rest for longer periods, mix in some mild activity or stretching between periods of rest. This will help to prevent stiffness and pain. Get regular exercise. Ask your provider what activities are safe for you. You may have to avoid lifting. Ask your provider how much you can safely lift. If you do lift, always use the right technique. This means you should: Bend your knees. Keep the load close to your body. Avoid twisting. Medicines Take over-the-counter and prescription medicines only as told by your provider. You may need to take medicines for pain and inflammation. These may be taken by mouth or put on the skin. You may also be given muscle relaxants. Ask your provider if the medicine prescribed to you: Requires you to avoid driving or using machinery. Can cause constipation. You may need to take these actions to prevent or treat constipation: Drink enough fluid to keep your pee (urine) pale yellow. Take over-the-counter or prescription medicines. Eat foods that are high in fiber, such as beans, whole grains, and fresh fruits and vegetables. Limit foods that are high in fat and processed sugars, such as fried or sweet foods. General instructions  Sleep on a firm mattress in a comfortable position. Try lying on your side with your knees slightly bent. If you lie on your back, put a pillow under your knees. Do not use any products that contain nicotine or tobacco. These products include cigarettes, chewing tobacco, and vaping devices, such as e-cigarettes. If you need help quitting, ask your provider. Contact a health care provider if: You have pain that does not get better with rest or medicine. You have new pain. You have a fever. You lose weight quickly. You have trouble doing your normal activities. You feel weak or numb in one or both of your legs or feet. Get help right away if: You are not able to control when  you pee or poop. You have severe back  pain and: Nausea or vomiting. Pain in your chest or abdomen. Shortness of breath. You faint. These symptoms may be an emergency. Get help right away. Call 911. Do not wait to see if the symptoms will go away. Do not drive yourself to the hospital. This information is not intended to replace advice given to you by your health care provider. Make sure you discuss any questions you have with your health care provider. Document Revised: 10/16/2021 Document Reviewed: 10/16/2021 Elsevier Patient Education  2023 Elsevier Inc.     Signed,   Meredith Staggers, MD [facility], [facility] 07/23/22 12:37 PM

## 2022-07-23 NOTE — Patient Instructions (Signed)
I will refer you to physical therapy for your back.  Meloxicam once per day as needed for now for the soreness.  Do not combine that with over-the-counter medicines like Advil or Aleve but Tylenol is okay.  Muscle relaxant can be taken up to 3 times per day but I would start with that at bedtime if needed for back pain or muscle spasm.  See information below.  Recheck with me in 1 month, sooner if worse.   Chronic Back Pain Chronic back pain is back pain that lasts longer than 3 months. The cause of your back pain may not be known. Some common causes include: Wear and tear (degenerative disease) of the bones, disks, or tissues that connect bones to each other (ligaments) in your back. Inflammation and stiffness in your back (arthritis). If you have chronic back pain, you may have times when the pain is more intense (flare-ups). You can also learn to manage the pain with home care. Follow these instructions at home: Watch for any changes in your symptoms. Take these actions to help with your pain: Managing pain and stiffness     If told, put ice on the painful area. You may be told to apply ice for the first 24-48 hours after a flare-up starts. Put ice in a plastic bag. Place a towel between your skin and the bag. Leave the ice on for 20 minutes, 2-3 times per day. If told, apply heat to the affected area as often as told by your health care provider. Use the heat source that your provider recommends, such as a moist heat pack or a heating pad. Place a towel between your skin and the heat source. Leave the heat on for 20-30 minutes. If your skin turns bright red, remove the ice or heat right away to prevent skin damage. The risk of damage is higher if you cannot feel pain, heat, or cold. Try soaking in a warm tub. Activity        Avoid bending and other activities that make the pain worse. Have good posture when you stand or sit. When you stand, keep your upper back and neck straight,  with your shoulders pulled back. Avoid slouching. When you sit, keep your back straight. Relax your shoulders. Do not round your shoulders or pull them backward. Do not sit or stand in one place for too long. Take brief periods of rest during the day. This will reduce your pain. Resting in a lying or standing position is often better than sitting to rest. When you rest for longer periods, mix in some mild activity or stretching between periods of rest. This will help to prevent stiffness and pain. Get regular exercise. Ask your provider what activities are safe for you. You may have to avoid lifting. Ask your provider how much you can safely lift. If you do lift, always use the right technique. This means you should: Bend your knees. Keep the load close to your body. Avoid twisting. Medicines Take over-the-counter and prescription medicines only as told by your provider. You may need to take medicines for pain and inflammation. These may be taken by mouth or put on the skin. You may also be given muscle relaxants. Ask your provider if the medicine prescribed to you: Requires you to avoid driving or using machinery. Can cause constipation. You may need to take these actions to prevent or treat constipation: Drink enough fluid to keep your pee (urine) pale yellow. Take over-the-counter or prescription medicines. Eat  foods that are high in fiber, such as beans, whole grains, and fresh fruits and vegetables. Limit foods that are high in fat and processed sugars, such as fried or sweet foods. General instructions  Sleep on a firm mattress in a comfortable position. Try lying on your side with your knees slightly bent. If you lie on your back, put a pillow under your knees. Do not use any products that contain nicotine or tobacco. These products include cigarettes, chewing tobacco, and vaping devices, such as e-cigarettes. If you need help quitting, ask your provider. Contact a health care provider  if: You have pain that does not get better with rest or medicine. You have new pain. You have a fever. You lose weight quickly. You have trouble doing your normal activities. You feel weak or numb in one or both of your legs or feet. Get help right away if: You are not able to control when you pee or poop. You have severe back pain and: Nausea or vomiting. Pain in your chest or abdomen. Shortness of breath. You faint. These symptoms may be an emergency. Get help right away. Call 911. Do not wait to see if the symptoms will go away. Do not drive yourself to the hospital. This information is not intended to replace advice given to you by your health care provider. Make sure you discuss any questions you have with your health care provider. Document Revised: 10/16/2021 Document Reviewed: 10/16/2021 Elsevier Patient Education  2023 [employer].

## 2022-08-01 ENCOUNTER — Telehealth: Payer: Self-pay

## 2022-08-01 NOTE — Telephone Encounter (Signed)
Lvm to schedule from waitlist  

## 2022-08-09 ENCOUNTER — Encounter: Payer: Self-pay | Admitting: Family Medicine

## 2022-08-09 ENCOUNTER — Ambulatory Visit: Payer: BC Managed Care – PPO

## 2022-08-09 ENCOUNTER — Ambulatory Visit: Payer: BC Managed Care – PPO | Admitting: Family Medicine

## 2022-08-09 VITALS — BP 124/80 | HR 89 | Temp 98.4°F | Resp 17 | Ht 62.0 in | Wt 153.2 lb

## 2022-08-09 DIAGNOSIS — J329 Chronic sinusitis, unspecified: Secondary | ICD-10-CM | POA: Diagnosis not present

## 2022-08-09 DIAGNOSIS — M5459 Other low back pain: Secondary | ICD-10-CM

## 2022-08-09 DIAGNOSIS — B9689 Other specified bacterial agents as the cause of diseases classified elsewhere: Secondary | ICD-10-CM | POA: Diagnosis not present

## 2022-08-09 DIAGNOSIS — M2560 Stiffness of unspecified joint, not elsewhere classified: Secondary | ICD-10-CM

## 2022-08-09 DIAGNOSIS — J309 Allergic rhinitis, unspecified: Secondary | ICD-10-CM | POA: Diagnosis not present

## 2022-08-09 DIAGNOSIS — M542 Cervicalgia: Secondary | ICD-10-CM | POA: Diagnosis not present

## 2022-08-09 MED ORDER — AMOXICILLIN 875 MG PO TABS: 875.0000 mg | ORAL_TABLET | Freq: Two times a day (BID) | ORAL | 0 refills | Status: AC

## 2022-08-09 MED ORDER — FLUTICASONE PROPIONATE 50 MCG/ACT NA SUSP: 2.0000 | Freq: Every day | NASAL | 6 refills | Status: DC

## 2022-08-09 NOTE — Therapy (Signed)
OUTPATIENT PHYSICAL THERAPY TREATMENT NOTE   Patient Name: Paula [name] [name]: [id number] DOB:27-Dec-1970, 52 y.o., female Today's Date: 08/09/2022  PCP: Dr. Meredith Staggers   REFERRING PROVIDER: Dr. Meredith Staggers   END OF SESSION:   PT End of Session - 08/09/22 1548     Visit Number 2    Number of Visits 12    Date for PT Re-Evaluation 09/03/22    Authorization Type [employer] State    PT Start Time 1547    PT Stop Time 1629    PT Time Calculation (min) 42 min    Activity Tolerance Patient tolerated treatment well    Behavior During Therapy [facility] for tasks assessed/performed             Past Medical History:  Diagnosis Date   Allergy    Anemia    Past Surgical History:  Procedure Laterality Date   ABDOMINAL HYSTERECTOMY  11/26/2016   Procedure: HYSTERECTOMY ABDOMINAL;  Surgeon: Zelphia Cairo, MD;  Location: [facility];  Service: Gynecology;;   COLONOSCOPY WITH PROPOFOL N/A 09/22/2020   Procedure: COLONOSCOPY WITH PROPOFOL;  Surgeon: Rachael Fee, MD;  Location: [facility];  Service: Endoscopy;  Laterality: N/A;   NO PAST SURGERIES     POLYPECTOMY  09/22/2020   Procedure: POLYPECTOMY;  Surgeon: Rachael Fee, MD;  Location: [facility];  Service: Endoscopy;;   Patient Active Problem List   Diagnosis Date Noted   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   ONSET DATE: chronic   SUBJECTIVE:                                                                                                                                                                                                          SUBJECTIVE STATEMENT: Pt reports she would  like to continue PT for her neck. Pt notes her neck pain is about the same. It feels very still today. Pt notes she has been completing her HEP some of the time.   Hand dominance: Right   PERTINENT HISTORY:  Not pertinent    PAIN:  Are you having pain? Yes: NPRS scale: 10/10 08/09/22- With a degree of language barrier, this PT is not sure the pt understood the pain scale  Pain location: neck  Pain description: tight Aggravating factors: constant  Relieving factors: medicine, massage    Yes: NPRS scale: 5/10 Pain location: Rt lateral hip, low back Pain description: stabbing  Aggravating factors: sitting too long, getting up, getting out of bed , walking down stairs  Relieving factors: meds, rest    PRECAUTIONS: None   WEIGHT BEARING RESTRICTIONS: No   FALLS:  Has patient fallen in last 6 months? No   LIVING ENVIRONMENT: Lives with: lives with their familysons x 2 > 18 yrs and spouse  Lives in: House/apartment Stairs: Yes: Internal: 8 steps; on right going up Has following equipment at home: None   OCCUPATION: working full time [employer]   PLOF: Independent, Independent with household mobility without device, Independent with community mobility without device, Vocation/Vocational requirements: physical, lifting and pushing , and Leisure: being with family    PATIENT GOALS: I want to be able to have less pain /    NEXT MD VISIT: unknown    OBJECTIVE: (objective measures completed at initial evaluation unless otherwise dated)   DIAGNOSTIC FINDINGS:  2022 MRI: Mild multilevel disc desiccation without significant disc height loss. Tiny central disc protrusions at C6-C7 and C7-T1. No significant spinal canal or foraminal stenosis   Imaging 04/06/16 - lumbar spine XR: FINDINGS: There is no evidence of fracture or subluxation. Vertebral bodies demonstrate normal height and alignment. Intervertebral disc spaces are preserved. The visualized neural foramina are  grossly unremarkable in appearance.    PATIENT SURVEYS:  FOTO 48% lumbar none for neck    COGNITION: Overall cognitive status: Within functional limits for tasks assessed   SENSATION: WFL   POSTURE: rounded shoulders and forward head   PALPATION: Stiffness and pain felt throughout cervical spine with palpation.  Hypertonic along posterior lateral cervical and pain along suboccipitals.  Stiff with PA mobs in cervicothoracic junction.    CERVICAL ROM:    Active ROM A/PROM (deg) eval  Flexion 60 pain   Extension 35  Right lateral flexion 36 pain   Left lateral flexion 35 pain  Right rotation Tight approx 50  Left rotation Tight approx 50    (Blank rows = not tested) TRUNK ROM                        Lumbar flexion touch toes , ext 50% , rotation WFL and lateral flexion WNL  UPPER EXTREMITY ROM: WFLs  Active ROM Right eval Left eval  Shoulder flexion      Shoulder extension      Shoulder abduction      Shoulder adduction      Shoulder extension      Shoulder internal rotation      Shoulder external rotation      Elbow flexion      Elbow extension      Wrist flexion      Wrist extension      Wrist ulnar deviation      Wrist radial deviation      Wrist pronation      Wrist supination       (Blank rows = not tested)   UPPER EXTREMITY MMT: WFLs  MMT Right eval Left eval  Shoulder flexion      Shoulder extension      Shoulder abduction      Shoulder adduction      Shoulder extension      Shoulder internal rotation      Shoulder external rotation  Middle trapezius      Lower trapezius      Elbow flexion      Elbow extension      Wrist flexion      Wrist extension      Wrist ulnar deviation      Wrist radial deviation      Wrist pronation      Wrist supination      Grip strength       (Blank rows = not tested)   CERVICAL SPECIAL TESTS:  Neck flexor muscle endurance test: 15 sec  and Distraction test: improved pain    FUNCTIONAL TESTS:  90/90  hold isometric static with chin tuck 15 sec , back strain , loss of neutral    TODAY'S TREATMENT:  [facility] Adult PT Treatment:                                                DATE: 08/09/22 Therapeutic Exercise: Supine Cervical Retraction with Towel 10 reps 5 hold Standing Cervical Retraction with Sidebending  3 reps 15 hold Standing Cervical Rotation AROM with Overpressure 3 reps 15 hold Standing Scapular Retraction c chin tuck at and away from wall 5 reps  5 hold, each way Manual Therapy: STM/DTM to the bilat upper traps, cervical paraspinals, suboccipitals Suboccipital release                                                                                                                              DATE: 07/23/22    PATIENT EDUCATION:  Education details: posture, HEP, core , DN  Person educated: Patient Education method: Explanation, Demonstration, and Handouts Education comprehension: verbalized understanding, returned demonstration, and needs further education   HOME EXERCISE PROGRAM: Access Code: [email or web address] URL: [email or web address] Date: 07/23/2022 Prepared by: Karie Mainland   Exercises - Standing Cervical Retraction with Sidebending  - 1 x daily - 7 x weekly - 2 sets - 10 reps - 15-20 hold - Standing Cervical Rotation AROM with Overpressure  - 1 x daily - 7 x weekly - 2 sets - 10 reps - 15-20 hold - Supine Cervical Retraction with Towel  - 1 x daily - 7 x weekly - 2 sets - 10 reps - 5 hold - Standing Scapular Retraction  - 1 x daily - 7 x weekly - 2 sets - 10 reps - 5 hold - Correct Seated Posture  - 1-5 x daily - 7 x weekly - 1 sets   ASSESSMENT:   CLINICAL IMPRESSION: Patient returns to PT wishing to continue PT for her neck vs low back at this time. PT was completed for manual therapy as documented above. Increased muscle tension was palpated of the treated muscles. Pt's HEP was then reviewed with pt completed therex for postural strengthening and cervical  mobility. With verbal cueing, pt  completed her HEP properly. Pt tolerated PT today without adverse effects. Pt reported a decrease in neck pain and stiffness after the treatment session. Pt will continue to benefit from skilled PT to address impairments for improved neck function with less pain.   OBJECTIVE IMPAIRMENTS: decreased mobility, difficulty walking, decreased ROM, decreased strength, hypomobility, increased fascial restrictions, increased muscle spasms, impaired flexibility, postural dysfunction, and pain.    ACTIVITY LIMITATIONS: carrying, lifting, bending, sitting, squatting, sleeping, stairs, bed mobility, reach over head, locomotion level, and caring for others   PARTICIPATION LIMITATIONS: meal prep, cleaning, laundry, community activity, and occupation   PERSONAL FACTORS: Profession and 1 comorbidity: chronic back and neck pain  are also affecting patient's functional outcome.    REHAB POTENTIAL: Excellent   CLINICAL DECISION MAKING: Stable/uncomplicated   EVALUATION COMPLEXITY: Low     GOALS:   LONG TERM GOALS: Target date: 09/03/2022       Patient will be independent with home exercise program for neck and low back including posture and core Baseline:  Goal status: INITIAL   2.  Patient will be able to have no neck pain with normal ADLs Baseline:  Goal status: INITIAL   3.  Patient will notice improved ability to go up and down stairs in the morning due to decreased right sided back pain Baseline:  Goal status: INITIAL   4.  Patient will no longer have right-sided low back pain when transferring sit to stand or in bed Baseline:  Goal status: INITIAL   5.  Patient will demonstrate normal cervical active range of motion with stretch only Baseline:  Goal status: INITIAL   6.  Foto score will increase to 63% (lumbar) Baseline: 48% Goal status: INITIAL     PLAN:   PT FREQUENCY: 1-2x/week   PT DURATION: 6 weeks   PLANNED INTERVENTIONS: Therapeutic  exercises, Therapeutic activity, Patient/Family education, Self Care, Joint mobilization, Dry Needling, Electrical stimulation, Spinal manipulation, Spinal mobilization, Cryotherapy, Moist heat, Manual therapy, and Re-evaluation   PLAN FOR NEXT SESSION: Check home exercise program lower body evaluation lumbar. Progress core stability and observe postural faults with functional activity.  Joellyn Rued, PT 08/09/2022, 6:02 PM

## 2022-08-09 NOTE — Patient Instructions (Signed)
Follow up as needed or as scheduled START the Amoxicillin twice daily- take w/ food Drink LOTS of fluids REST! Mucinex DM or Delsym for cough/congestion Tylenol or ibuprofen as needed for headache Call with any questions or concerns Hang in there!!!

## 2022-08-09 NOTE — Progress Notes (Signed)
   Subjective:    Patient ID: Paula [name], female    DOB: 1970-04-25, 52 y.o.   [name]: [id number]  HPI URI- pt reports congestion, facial pain/pressure, HA.  No ear pain.  Developed cough this morning.  Sxs started 5 days ago.  Took Zyrtec, Benadryl w/o relief.  Pt reports increased head pressure when leaning forward.  No hx of sinus infection.     Review of Systems For ROS see HPI     Objective:   Physical Exam Vitals reviewed.  Constitutional:      General: She is not in acute distress.    Appearance: Normal appearance. She is well-developed. She is not ill-appearing.  HENT:     Head: Normocephalic and atraumatic.     Right Ear: Tympanic membrane and ear canal normal.     Left Ear: Tympanic membrane and ear canal normal.     Nose: Mucosal edema and congestion present. No rhinorrhea.     Right Sinus: Maxillary sinus tenderness and frontal sinus tenderness present.     Left Sinus: Maxillary sinus tenderness and frontal sinus tenderness present.     Mouth/Throat:     Pharynx: Uvula midline. No oropharyngeal exudate or posterior oropharyngeal erythema.  Eyes:     Conjunctiva/sclera: Conjunctivae normal.     Pupils: Pupils are equal, round, and reactive to light.  Cardiovascular:     Rate and Rhythm: Normal rate and regular rhythm.     Heart sounds: Normal heart sounds.  Pulmonary:     Effort: Pulmonary effort is normal. No respiratory distress.     Breath sounds: Normal breath sounds. No wheezing.  Musculoskeletal:     Cervical back: Normal range of motion and neck supple.  Lymphadenopathy:     Cervical: No cervical adenopathy.  Skin:    General: Skin is warm and dry.  Neurological:     General: No focal deficit present.     Mental Status: She is alert and oriented to person, place, and time.     Cranial Nerves: No cranial nerve deficit.     Motor: No weakness.     Coordination: Coordination normal.  Psychiatric:        Mood and Affect: Mood normal.        Behavior:  Behavior normal.        Thought Content: Thought content normal.           Assessment & Plan:  Bacterial sinusitis- new.  Pt's sxs and PE consistent w/ infxn.  Start abx.  Reviewed supportive care and red flags that should prompt return.  Pt expressed understanding and is in agreement w/ plan.

## 2022-08-14 ENCOUNTER — Ambulatory Visit: Payer: BC Managed Care – PPO | Attending: Critical Care Medicine

## 2022-08-14 DIAGNOSIS — M2560 Stiffness of unspecified joint, not elsewhere classified: Secondary | ICD-10-CM | POA: Diagnosis present

## 2022-08-14 DIAGNOSIS — M5459 Other low back pain: Secondary | ICD-10-CM | POA: Insufficient documentation

## 2022-08-14 DIAGNOSIS — M542 Cervicalgia: Secondary | ICD-10-CM | POA: Diagnosis present

## 2022-08-14 DIAGNOSIS — M545 Low back pain, unspecified: Secondary | ICD-10-CM | POA: Insufficient documentation

## 2022-08-14 DIAGNOSIS — G8929 Other chronic pain: Secondary | ICD-10-CM | POA: Insufficient documentation

## 2022-08-14 NOTE — Therapy (Signed)
OUTPATIENT PHYSICAL THERAPY TREATMENT NOTE   Patient Name: Paula [name] [name]: [id number] DOB:05-07-1970, 52 y.o., female Today's Date: 08/14/2022  PCP: Dr. Meredith Staggers   REFERRING PROVIDER: Dr. Meredith Staggers   END OF SESSION:   PT End of Session - 08/14/22 1534     Visit Number 3    Number of Visits 12    Date for PT Re-Evaluation 09/03/22    Authorization Type [employer] State    PT Start Time 0304    PT Stop Time 0345    PT Time Calculation (min) 41 min    Activity Tolerance Patient tolerated treatment well    Behavior During Therapy [facility] for tasks assessed/performed              Past Medical History:  Diagnosis Date   Allergy    Anemia    Past Surgical History:  Procedure Laterality Date   ABDOMINAL HYSTERECTOMY  11/26/2016   Procedure: HYSTERECTOMY ABDOMINAL;  Surgeon: Zelphia Cairo, MD;  Location: [facility];  Service: Gynecology;;   COLONOSCOPY WITH PROPOFOL N/A 09/22/2020   Procedure: COLONOSCOPY WITH PROPOFOL;  Surgeon: Rachael Fee, MD;  Location: [facility];  Service: Endoscopy;  Laterality: N/A;   NO PAST SURGERIES     POLYPECTOMY  09/22/2020   Procedure: POLYPECTOMY;  Surgeon: Rachael Fee, MD;  Location: [facility];  Service: Endoscopy;;   Patient Active Problem List   Diagnosis Date Noted   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   ONSET DATE: chronic   SUBJECTIVE:                                                                                                                                                                                                          SUBJECTIVE STATEMENT: Pt reports  significant tightness of her upper neck and the back of her head.   Hand dominance: Right   PERTINENT HISTORY:  Not pertinent    PAIN:  Are you having pain? Yes: NPRS scale:08/14/22=0/10, pt reports significant tightness upper neck and back of her head Pain location: neck  Pain description: tight Aggravating factors: constant  Relieving factors: medicine, massage    Yes: NPRS scale: 5/10 Pain location: Rt lateral hip, low back Pain  description: stabbing  Aggravating factors: sitting too long, getting up, getting out of bed , walking down stairs  Relieving factors: meds, rest    PRECAUTIONS: None   WEIGHT BEARING RESTRICTIONS: No   FALLS:  Has patient fallen in last 6 months? No   LIVING ENVIRONMENT: Lives with: lives with their familysons x 2 > 18 yrs and spouse  Lives in: House/apartment Stairs: Yes: Internal: 8 steps; on right going up Has following equipment at home: None   OCCUPATION: working full time [employer]   PLOF: Independent, Independent with household mobility without device, Independent with community mobility without device, Vocation/Vocational requirements: physical, lifting and pushing , and Leisure: being with family    PATIENT GOALS: I want to be able to have less pain /    NEXT MD VISIT: unknown    OBJECTIVE: (objective measures completed at initial evaluation unless otherwise dated)   DIAGNOSTIC FINDINGS:  2022 MRI: Mild multilevel disc desiccation without significant disc height loss. Tiny central disc protrusions at C6-C7 and C7-T1. No significant spinal canal or foraminal stenosis   Imaging 04/06/16 - lumbar spine XR: FINDINGS: There is no evidence of fracture or subluxation. Vertebral bodies demonstrate normal height and alignment. Intervertebral disc spaces are preserved. The visualized neural foramina are grossly unremarkable in appearance.    PATIENT SURVEYS:  FOTO 48% lumbar none for neck    COGNITION: Overall cognitive  status: Within functional limits for tasks assessed   SENSATION: WFL   POSTURE: rounded shoulders and forward head   PALPATION: Stiffness and pain felt throughout cervical spine with palpation.  Hypertonic along posterior lateral cervical and pain along suboccipitals.  Stiff with PA mobs in cervicothoracic junction.    CERVICAL ROM:    Active ROM A/PROM (deg) eval  Flexion 60 pain   Extension 35  Right lateral flexion 36 pain   Left lateral flexion 35 pain  Right rotation Tight approx 50  Left rotation Tight approx 50    (Blank rows = not tested) TRUNK ROM                        Lumbar flexion touch toes , ext 50% , rotation WFL and lateral flexion WNL  UPPER EXTREMITY ROM: WFLs  Active ROM Right eval Left eval  Shoulder flexion      Shoulder extension      Shoulder abduction      Shoulder adduction      Shoulder extension      Shoulder internal rotation      Shoulder external rotation      Elbow flexion      Elbow extension      Wrist flexion      Wrist extension      Wrist ulnar deviation      Wrist radial deviation      Wrist pronation      Wrist supination       (Blank rows = not tested)   UPPER EXTREMITY MMT: WFLs  MMT Right eval Left eval  Shoulder flexion      Shoulder extension      Shoulder abduction      Shoulder adduction      Shoulder extension      Shoulder internal rotation      Shoulder external rotation      Middle trapezius      Lower trapezius      Elbow flexion      Elbow extension  Wrist flexion      Wrist extension      Wrist ulnar deviation      Wrist radial deviation      Wrist pronation      Wrist supination      Grip strength       (Blank rows = not tested)   CERVICAL SPECIAL TESTS:  Neck flexor muscle endurance test: 15 sec  and Distraction test: improved pain    FUNCTIONAL TESTS:  90/90 hold isometric static with chin tuck 15 sec , back strain , loss of neutral    TODAY'S TREATMENT:  [facility] Adult PT Treatment:                                                 DATE: 08/14/22 Therapeutic Exercise: Standing Cervical Retraction with Towel 10 reps 5 hold Standing Cervical Retraction with Sidebending  5 reps 15 hold Standing shoulder abd 2x10 GTB Standing shoulder ER 2x10 GTB Manual Therapy: STM/DTM to the bilat upper traps, cervical paraspinals, suboccipitals Suboccipital release Modalities: IFC/premod x15 mins c moist heat to the neck and upper shoulders, intensity as tolerated  [facility] Adult PT Treatment:                                                DATE: 08/09/22 Therapeutic Exercise: Supine Cervical Retraction with Towel 10 reps 5 hold Standing Cervical Retraction with Sidebending  3 reps 15 hold Standing Cervical Rotation AROM with Overpressure 3 reps 15 hold Standing Scapular Retraction c chin tuck at and away from wall 5 reps  5 hold, each way Manual Therapy: STM/DTM to the bilat upper traps, cervical paraspinals, suboccipitals Suboccipital release                                                                                                                              DATE: 07/23/22    PATIENT EDUCATION:  Education details: posture, HEP, core , DN  Person educated: Patient Education method: Explanation, Demonstration, and Handouts Education comprehension: verbalized understanding, returned demonstration, and needs further education   HOME EXERCISE PROGRAM: Access Code: [email or web address] URL: [email or web address] Date: 08/14/2022 Prepared by: Joellyn Rued  Exercises - Standing Cervical Retraction with Sidebending  - 1 x daily - 7 x weekly - 2 sets - 10 reps - 15-20 hold - Standing Cervical Rotation AROM with Overpressure  - 1 x daily - 7 x weekly - 2 sets - 10 reps - 15-20 hold - Supine Cervical Retraction with Towel  - 1 x daily - 7 x weekly - 2 sets - 10 reps - 5 hold - Standing Scapular Retraction  - 1 x daily - 7 x weekly - 2  sets - 10 reps - 5 hold - Standing Shoulder  Horizontal Abduction with Resistance  - 1 x daily - 7 x weekly - 2 sets - 10 reps - 3 hold - Shoulder External Rotation and Scapular Retraction with Resistance  - 1 x daily - 7 x weekly - 2 sets - 10 reps - 3 hold - Correct Seated Posture  - 1-5 x daily - 7 x weekly - 1 sets   ASSESSMENT:   CLINICAL IMPRESSION: PT was completed for manual therapy as documented above. Therex was then completed for postural and posterior cahin strengthening, and cervical mobility. Verbal cueing was provided for most proper technique c therex. Further instruction is needed. IFC/premod estim with moist heat was then provided to her neck and upper shoulders. Pt reported feeling better after the estim and moist heat. Pt tolerated PT today without adverse effects.     OBJECTIVE IMPAIRMENTS: decreased mobility, difficulty walking, decreased ROM, decreased strength, hypomobility, increased fascial restrictions, increased muscle spasms, impaired flexibility, postural dysfunction, and pain.    ACTIVITY LIMITATIONS: carrying, lifting, bending, sitting, squatting, sleeping, stairs, bed mobility, reach over head, locomotion level, and caring for others   PARTICIPATION LIMITATIONS: meal prep, cleaning, laundry, community activity, and occupation   PERSONAL FACTORS: Profession and 1 comorbidity: chronic back and neck pain  are also affecting patient's functional outcome.    REHAB POTENTIAL: Excellent   CLINICAL DECISION MAKING: Stable/uncomplicated   EVALUATION COMPLEXITY: Low     GOALS:   LONG TERM GOALS: Target date: 09/03/2022       Patient will be independent with home exercise program for neck and low back including posture and core Baseline:  Goal status: INITIAL   2.  Patient will be able to have no neck pain with normal ADLs Baseline:  Goal status: INITIAL   3.  Patient will notice improved ability to go up and down stairs in the morning due to decreased right sided back pain Baseline:  Goal status:  INITIAL   4.  Patient will no longer have right-sided low back pain when transferring sit to stand or in bed Baseline:  Goal status: INITIAL   5.  Patient will demonstrate normal cervical active range of motion with stretch only Baseline:  Goal status: INITIAL   6.  Foto score will increase to 63% (lumbar) Baseline: 48% Goal status: INITIAL     PLAN:   PT FREQUENCY: 1-2x/week   PT DURATION: 6 weeks   PLANNED INTERVENTIONS: Therapeutic exercises, Therapeutic activity, Patient/Family education, Self Care, Joint mobilization, Dry Needling, Electrical stimulation, Spinal manipulation, Spinal mobilization, Cryotherapy, Moist heat, Manual therapy, and Re-evaluation   PLAN FOR NEXT SESSION: Check home exercise program lower body evaluation lumbar. Progress core stability and observe postural faults with functional activity.  Joellyn Rued, PT 08/14/2022, 6:19 PM

## 2022-08-16 ENCOUNTER — Ambulatory Visit: Payer: BC Managed Care – PPO

## 2022-08-16 DIAGNOSIS — M542 Cervicalgia: Secondary | ICD-10-CM

## 2022-08-16 DIAGNOSIS — M2560 Stiffness of unspecified joint, not elsewhere classified: Secondary | ICD-10-CM

## 2022-08-16 DIAGNOSIS — M5459 Other low back pain: Secondary | ICD-10-CM

## 2022-08-16 NOTE — Therapy (Signed)
OUTPATIENT PHYSICAL THERAPY TREATMENT NOTE   Patient Name: Paula [name] [name]: [id number] DOB:[birth year], 52 y.o., female Today's Date: 08/16/2022  PCP: Dr. Meredith Staggers   REFERRING PROVIDER: Dr. Meredith Staggers   END OF SESSION:   PT End of Session - 08/16/22 1354     Visit Number 4    Number of Visits 12    Date for PT Re-Evaluation 09/03/22    Authorization Type BCBS State    PT Start Time 1356    PT Stop Time 1446    PT Time Calculation (min) 50 min    Activity Tolerance Patient tolerated treatment well    Behavior During Therapy [facility] for tasks assessed/performed               Past Medical History:  Diagnosis Date   Allergy    Anemia    Past Surgical History:  Procedure Laterality Date   ABDOMINAL HYSTERECTOMY  11/26/2016   Procedure: HYSTERECTOMY ABDOMINAL;  Surgeon: Zelphia Cairo, MD;  Location: [facility];  Service: Gynecology;;   COLONOSCOPY WITH PROPOFOL N/A 09/22/2020   Procedure: COLONOSCOPY WITH PROPOFOL;  Surgeon: Rachael Fee, MD;  Location: [facility];  Service: Endoscopy;  Laterality: N/A;   NO PAST SURGERIES     POLYPECTOMY  09/22/2020   Procedure: POLYPECTOMY;  Surgeon: Rachael Fee, MD;  Location: [facility];  Service: Endoscopy;;   Patient Active Problem List   Diagnosis Date Noted   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   Pre-diabetes 03/02/2020   Vitamin D deficiency 03/02/2020   Cervicalgia 12/23/2019   Fibroids 11/26/2016   Amenorrhea 08/05/2012    REFERRING DIAG: M54.2 (ICD-10-CM) - Cervicalgia   THERAPY DIAG:  Cervicalgia  Joint stiffness of spine  Other low back pain  Rationale for Evaluation and Treatment Rehabilitation  ONSET DATE: chronic   SUBJECTIVE:                                                                                                                                                                                                          SUBJECTIVE STATEMENT: Patient reports the heat and stim were  very helpful for her pain. "My neck is too tight. The back is ok and feels good." She is not interested in trying dry needling as she has done this before and it was too painful.    PERTINENT HISTORY:  Not pertinent    PAIN:  Are you having pain? Yes: NPRS scale:7/10 Pain location: neck  Pain description: tight Aggravating factors: constant  Relieving factors: medicine, massage     PRECAUTIONS: None  WEIGHT BEARING RESTRICTIONS: No   FALLS:  Has patient fallen in last 6 months? No   LIVING ENVIRONMENT: Lives with: lives with their familysons x 2 > 18 yrs and spouse  Lives in: House/apartment Stairs: Yes: Internal: 8 steps; on right going up Has following equipment at home: None   OCCUPATION: working full time [employer]   PLOF: Independent, Independent with household mobility without device, Independent with community mobility without device, Vocation/Vocational requirements: physical, lifting and pushing , and Leisure: being with family    PATIENT GOALS: I want to be able to have less pain /    NEXT MD VISIT: unknown    OBJECTIVE: (objective measures completed at initial evaluation unless otherwise dated)   DIAGNOSTIC FINDINGS:  2022 MRI: Mild multilevel disc desiccation without significant disc height loss. Tiny central disc protrusions at C6-C7 and C7-T1. No significant spinal canal or foraminal stenosis   Imaging 04/06/16 - lumbar spine XR: FINDINGS: There is no evidence of fracture or subluxation. Vertebral bodies demonstrate normal height and alignment. Intervertebral disc spaces are preserved. The visualized neural foramina are grossly unremarkable in appearance.    PATIENT SURVEYS:  FOTO 48% lumbar none for neck    COGNITION: Overall cognitive status: Within functional limits for tasks assessed   SENSATION: WFL   POSTURE: rounded shoulders and forward head   PALPATION: Stiffness and pain felt throughout cervical spine with palpation.   Hypertonic along posterior lateral cervical and pain along suboccipitals.  Stiff with PA mobs in cervicothoracic junction.    CERVICAL ROM:    Active ROM A/PROM (deg) eval  Flexion 60 pain   Extension 35  Right lateral flexion 36 pain   Left lateral flexion 35 pain  Right rotation Tight approx 50  Left rotation Tight approx 50    (Blank rows = not tested) TRUNK ROM                        Lumbar flexion touch toes , ext 50% , rotation WFL and lateral flexion WNL  UPPER EXTREMITY ROM: WFLs  Active ROM Right eval Left eval  Shoulder flexion      Shoulder extension      Shoulder abduction      Shoulder adduction      Shoulder extension      Shoulder internal rotation      Shoulder external rotation      Elbow flexion      Elbow extension      Wrist flexion      Wrist extension      Wrist ulnar deviation      Wrist radial deviation      Wrist pronation      Wrist supination       (Blank rows = not tested)   UPPER EXTREMITY MMT: WFLs  MMT Right eval Left eval  Shoulder flexion      Shoulder extension      Shoulder abduction      Shoulder adduction      Shoulder extension      Shoulder internal rotation      Shoulder external rotation      Middle trapezius      Lower trapezius      Elbow flexion      Elbow extension      Wrist flexion      Wrist extension      Wrist ulnar deviation      Wrist radial deviation  Wrist pronation      Wrist supination      Grip strength       (Blank rows = not tested)   CERVICAL SPECIAL TESTS:  Neck flexor muscle endurance test: 15 sec  and Distraction test: improved pain    FUNCTIONAL TESTS:  90/90 hold isometric static with chin tuck 15 sec , back strain , loss of neutral    TODAY'S TREATMENT:  [facility] Adult PT Treatment:                                                DATE: 08/16/22 Therapeutic Exercise: Cervical extension SNAG x 10  Upper trap stretch x 20 sec each  Levator scap stretch x 20 sec each  Prone scapular  retraction 2 x 10  Pec doorway stretch 2 x 30 sec  Bilateral shoulder ER yellow band 2 x 10  Supine cervical retraction 2 x 10  Manual Therapy: STM/DTM cervical paraspinals, upper traps, levator scapulae, suboccipitals.  Demo and returned demo of use of theracane and tennis ball for self-soft tissue mobilization   Modalities: premod x 10 mins c moist heat to the neck and upper shoulders, intensity as tolerated   [facility] Adult PT Treatment:                                                DATE: 08/14/22 Therapeutic Exercise: Standing Cervical Retraction with Towel 10 reps 5 hold Standing Cervical Retraction with Sidebending  5 reps 15 hold Standing shoulder abd 2x10 GTB Standing shoulder ER 2x10 GTB Manual Therapy: STM/DTM to the bilat upper traps, cervical paraspinals, suboccipitals Suboccipital release Modalities: IFC/premod x15 mins c moist heat to the neck and upper shoulders, intensity as tolerated  [facility] Adult PT Treatment:                                                DATE: 08/09/22 Therapeutic Exercise: Supine Cervical Retraction with Towel 10 reps 5 hold Standing Cervical Retraction with Sidebending  3 reps 15 hold Standing Cervical Rotation AROM with Overpressure 3 reps 15 hold Standing Scapular Retraction c chin tuck at and away from wall 5 reps  5 hold, each way Manual Therapy: STM/DTM to the bilat upper traps, cervical paraspinals, suboccipitals Suboccipital release                                                                                                                              DATE: 07/23/22    PATIENT EDUCATION:  Education details: posture, TPDN, TENS unit, see treatment  Person educated: Patient Education method: Explanation Education comprehension: verbalized understanding   HOME EXERCISE PROGRAM: Access Code: [email or web address] URL: [email or web address] Date: 08/14/2022 Prepared by: Joellyn Rued  Exercises - Standing Cervical Retraction with  Sidebending  - 1 x daily - 7 x weekly - 2 sets - 10 reps - 15-20 hold - Standing Cervical Rotation AROM with Overpressure  - 1 x daily - 7 x weekly - 2 sets - 10 reps - 15-20 hold - Supine Cervical Retraction with Towel  - 1 x daily - 7 x weekly - 2 sets - 10 reps - 5 hold - Standing Scapular Retraction  - 1 x daily - 7 x weekly - 2 sets - 10 reps - 5 hold - Standing Shoulder Horizontal Abduction with Resistance  - 1 x daily - 7 x weekly - 2 sets - 10 reps - 3 hold - Shoulder External Rotation and Scapular Retraction with Resistance  - 1 x daily - 7 x weekly - 2 sets - 10 reps - 3 hold - Correct Seated Posture  - 1-5 x daily - 7 x weekly - 1 sets   ASSESSMENT:   CLINICAL IMPRESSION: Patient continues to complain of neck tightness. Discussed potential benefit of TPDN, though patient declined stating she has received this intervention before and it was "too painful." Focused on cervical mobility and postural strengthening . She has significant tautness about cervical musculature, so educated her in self myofasical release with tennis ball and theracane. Provided information on home TENS unit as patient reports this was helpful in reducing her pain at previous session. Intermittent postural cues required to reduce excessive upper trap engagement.    OBJECTIVE IMPAIRMENTS: decreased mobility, difficulty walking, decreased ROM, decreased strength, hypomobility, increased fascial restrictions, increased muscle spasms, impaired flexibility, postural dysfunction, and pain.    ACTIVITY LIMITATIONS: carrying, lifting, bending, sitting, squatting, sleeping, stairs, bed mobility, reach over head, locomotion level, and caring for others   PARTICIPATION LIMITATIONS: meal prep, cleaning, laundry, community activity, and occupation   PERSONAL FACTORS: Profession and 1 comorbidity: chronic back and neck pain  are also affecting patient's functional outcome.    REHAB POTENTIAL: Excellent   CLINICAL DECISION  MAKING: Stable/uncomplicated   EVALUATION COMPLEXITY: Low     GOALS:   LONG TERM GOALS: Target date: 09/03/2022       Patient will be independent with home exercise program for neck and low back including posture and core Baseline:  Goal status: INITIAL   2.  Patient will be able to have no neck pain with normal ADLs Baseline:  Goal status: INITIAL   3.  Patient will notice improved ability to go up and down stairs in the morning due to decreased right sided back pain Baseline:  Goal status: INITIAL   4.  Patient will no longer have right-sided low back pain when transferring sit to stand or in bed Baseline:  Goal status: INITIAL   5.  Patient will demonstrate normal cervical active range of motion with stretch only Baseline:  Goal status: INITIAL   6.  Foto score will increase to 63% (lumbar) Baseline: 48% Goal status: INITIAL     PLAN:   PT FREQUENCY: 1-2x/week   PT DURATION: 6 weeks   PLANNED INTERVENTIONS: Therapeutic exercises, Therapeutic activity, Patient/Family education, Self Care, Joint mobilization, Dry Needling, Electrical stimulation, Spinal manipulation, Spinal mobilization, Cryotherapy, Moist heat, Manual therapy, and Re-evaluation   PLAN FOR NEXT SESSION: Check home exercise program lower body evaluation lumbar. Progress core stability and observe  postural faults with functional activity. Letitia Libra, PT, DPT, ATC 08/16/22 2:41 PM

## 2022-08-20 ENCOUNTER — Ambulatory Visit: Payer: BC Managed Care – PPO | Admitting: Physical Therapy

## 2022-08-20 ENCOUNTER — Encounter: Payer: Self-pay | Admitting: Physical Therapy

## 2022-08-20 DIAGNOSIS — M5459 Other low back pain: Secondary | ICD-10-CM

## 2022-08-20 DIAGNOSIS — M2560 Stiffness of unspecified joint, not elsewhere classified: Secondary | ICD-10-CM

## 2022-08-20 DIAGNOSIS — M542 Cervicalgia: Secondary | ICD-10-CM | POA: Diagnosis not present

## 2022-08-20 NOTE — Therapy (Signed)
OUTPATIENT PHYSICAL THERAPY TREATMENT NOTE   Patient Name: Paula [name] [name]: [id number] DOB:1970-09-18, 52 y.o., female Today's Date: 08/20/2022  PCP: Dr. Meredith Staggers   REFERRING PROVIDER: Dr. Meredith Staggers   END OF SESSION:   PT End of Session - 08/20/22 1447     Visit Number 5    Number of Visits 12    Date for PT Re-Evaluation 09/03/22    Authorization Type [employer] State    PT Start Time 0247    PT Stop Time 0345    PT Time Calculation (min) 58 min               Past Medical History:  Diagnosis Date   Allergy    Anemia    Past Surgical History:  Procedure Laterality Date   ABDOMINAL HYSTERECTOMY  11/26/2016   Procedure: HYSTERECTOMY ABDOMINAL;  Surgeon: Zelphia Cairo, MD;  Location: [facility];  Service: Gynecology;;   COLONOSCOPY WITH PROPOFOL N/A 09/22/2020   Procedure: COLONOSCOPY WITH PROPOFOL;  Surgeon: Rachael Fee, MD;  Location: [facility];  Service: Endoscopy;  Laterality: N/A;   NO PAST SURGERIES     POLYPECTOMY  09/22/2020   Procedure: POLYPECTOMY;  Surgeon: Rachael Fee, MD;  Location: [facility];  Service: Endoscopy;;   Patient Active Problem List   Diagnosis Date Noted   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   Pre-diabetes 03/02/2020   Vitamin D deficiency 03/02/2020   Cervicalgia 12/23/2019   Fibroids 11/26/2016   Amenorrhea 08/05/2012    REFERRING DIAG: M54.2 (ICD-10-CM) - Cervicalgia   THERAPY DIAG:  Cervicalgia  Other low back pain  Joint stiffness of spine  Rationale for Evaluation and Treatment Rehabilitation  ONSET DATE: chronic   SUBJECTIVE:                                                                                                                                                                                                          SUBJECTIVE STATEMENT: I might order the TENS unit.  My neck is the same it is tight. The back is ok if I take medicine.     PERTINENT HISTORY:  Not pertinent    PAIN:  Are  you having pain? Yes: NPRS scale:7/10 Pain location: neck  Pain description: tight Aggravating factors: constant  Relieving factors: medicine, massage     PRECAUTIONS: None   WEIGHT BEARING RESTRICTIONS: No   FALLS:  Has patient fallen in last 6 months? No   LIVING ENVIRONMENT: Lives with: lives with their familysons x 2 > 18 yrs and spouse  Lives in:  House/apartment Stairs: Yes: Internal: 8 steps; on right going up Has following equipment at home: None   OCCUPATION: working full time [employer]   PLOF: Independent, Independent with household mobility without device, Independent with community mobility without device, Vocation/Vocational requirements: physical, lifting and pushing , and Leisure: being with family    PATIENT GOALS: I want to be able to have less pain    NEXT MD VISIT: unknown    OBJECTIVE: (objective measures completed at initial evaluation unless otherwise dated)   DIAGNOSTIC FINDINGS:  2022 MRI: Mild multilevel disc desiccation without significant disc height loss. Tiny central disc protrusions at C6-C7 and C7-T1. No significant spinal canal or foraminal stenosis   Imaging 04/06/16 - lumbar spine XR: FINDINGS: There is no evidence of fracture or subluxation. Vertebral bodies demonstrate normal height and alignment. Intervertebral disc spaces are preserved. The visualized neural foramina are grossly unremarkable in appearance.    PATIENT SURVEYS:  FOTO 48% lumbar none for neck    COGNITION: Overall cognitive status: Within functional limits for tasks assessed   SENSATION: WFL   POSTURE: rounded shoulders and forward head   PALPATION: Stiffness and pain felt throughout cervical spine with palpation.  Hypertonic along posterior lateral cervical and pain along suboccipitals.  Stiff with PA mobs in cervicothoracic junction.    CERVICAL ROM:    Active ROM A/PROM (deg) eval  Flexion 60 pain   Extension 35  Right lateral flexion 36 pain    Left lateral flexion 35 pain  Right rotation Tight approx 50  Left rotation Tight approx 50    (Blank rows = not tested) TRUNK ROM                        Lumbar flexion touch toes , ext 50% , rotation WFL and lateral flexion WNL  UPPER EXTREMITY ROM: WFLs  Active ROM Right eval Left eval  Shoulder flexion      Shoulder extension      Shoulder abduction      Shoulder adduction      Shoulder extension      Shoulder internal rotation      Shoulder external rotation      Elbow flexion      Elbow extension      Wrist flexion      Wrist extension      Wrist ulnar deviation      Wrist radial deviation      Wrist pronation      Wrist supination       (Blank rows = not tested)   UPPER EXTREMITY MMT: WFLs  MMT Right eval Left eval  Shoulder flexion      Shoulder extension      Shoulder abduction      Shoulder adduction      Shoulder extension      Shoulder internal rotation      Shoulder external rotation      Middle trapezius      Lower trapezius      Elbow flexion      Elbow extension      Wrist flexion      Wrist extension      Wrist ulnar deviation      Wrist radial deviation      Wrist pronation      Wrist supination      Grip strength       (Blank rows = not tested)   CERVICAL SPECIAL TESTS:  Neck  flexor muscle endurance test: 15 sec  and Distraction test: improved pain  08/20/22: DNF endurance test: 30 sec    FUNCTIONAL TESTS:  90/90 hold isometric static with chin tuck 15 sec , back strain , loss of neutral  08/20/22: 90/90 hold isometric stab with chin tuck 30 sec, abdominal fatigue     TODAY'S TREATMENT:  [facility] Adult PT Treatment:                                                DATE: 08/20/22 Therapeutic Exercise: DNF endurance  30 sec  90/90 endurance  30 sec Seated chin tucks  Upper trap stretch x 2 each  Levator stretch x 2 each  Standing shoulder horizontal abduction Red band- back to wall for posture feedback 2 x 10 Standing shoulder ER with  scap retract red band - back to wall , 10 x 2  Standing front raise to 90 with wooden dowel focusing on inhibition of upper traps- tactile cues required.   Modalities: Estim - (IFC) to tolerance and HMP to cervical x 15 minutes  Self Care: Sub occipital release using 2 tennis balls- 5 min x 2 (below and above occipital ridge)  Sleep positioning with  towel roll at neck    [facility] Adult PT Treatment:                                                DATE: 08/16/22 Therapeutic Exercise: Cervical extension SNAG x 10  Upper trap stretch x 20 sec each  Levator scap stretch x 20 sec each  Prone scapular retraction 2 x 10  Pec doorway stretch 2 x 30 sec  Bilateral shoulder ER yellow band 2 x 10  Supine cervical retraction 2 x 10  Manual Therapy: STM/DTM cervical paraspinals, upper traps, levator scapulae, suboccipitals.  Demo and returned demo of use of theracane and tennis ball for self-soft tissue mobilization   Modalities: premod x 10 mins c moist heat to the neck and upper shoulders, intensity as tolerated   [facility] Adult PT Treatment:                                                DATE: 08/14/22 Therapeutic Exercise: Standing Cervical Retraction with Towel 10 reps 5 hold Standing Cervical Retraction with Sidebending  5 reps 15 hold Standing shoulder abd 2x10 GTB Standing shoulder ER 2x10 GTB Manual Therapy: STM/DTM to the bilat upper traps, cervical paraspinals, suboccipitals Suboccipital release Modalities: IFC/premod x15 mins c moist heat to the neck and upper shoulders, intensity as tolerated  [facility] Adult PT Treatment:                                                DATE: 08/09/22 Therapeutic Exercise: Supine Cervical Retraction with Towel 10 reps 5 hold Standing Cervical Retraction with Sidebending  3 reps 15 hold Standing Cervical Rotation AROM with Overpressure 3 reps 15 hold Standing Scapular Retraction c chin tuck at  and away from wall 5 reps  5 hold, each way Manual  Therapy: STM/DTM to the bilat upper traps, cervical paraspinals, suboccipitals Suboccipital release                                                                                                                              DATE: 07/23/22    PATIENT EDUCATION:  Education details: posture, TPDN, TENS unit, see treatment  Person educated: Patient Education method: Explanation Education comprehension: verbalized understanding   HOME EXERCISE PROGRAM: Access Code: [email or web address] URL: [email or web address] Date: 08/14/2022 Prepared by: Joellyn Rued  Exercises - Standing Cervical Retraction with Sidebending  - 1 x daily - 7 x weekly - 2 sets - 10 reps - 15-20 hold - Standing Cervical Rotation AROM with Overpressure  - 1 x daily - 7 x weekly - 2 sets - 10 reps - 15-20 hold - Supine Cervical Retraction with Towel  - 1 x daily - 7 x weekly - 2 sets - 10 reps - 5 hold - Standing Scapular Retraction  - 1 x daily - 7 x weekly - 2 sets - 10 reps - 5 hold - Standing Shoulder Horizontal Abduction with Resistance  - 1 x daily - 7 x weekly - 2 sets - 10 reps - 3 hold - Shoulder External Rotation and Scapular Retraction with Resistance  - 1 x daily - 7 x weekly - 2 sets - 10 reps - 3 hold - Correct Seated Posture  - 1-5 x daily - 7 x weekly - 1 sets   ASSESSMENT:   CLINICAL IMPRESSION: Patient continues to complain of neck tightness and only short term relief with modalities. Instructed in tennis ball suboccipital release. Continued postural strengthening and upper trap inhibition.  Intermittent postural cues required to reduce excessive upper trap engagement. She has difficulty raising arms to 90 without upper trap activation. Provided self care instruction for sub occipital release using tennis balls. Offered suggestions for sleep positions using towel roll to support neck.  Repeated IFC and HMP at end of session per pt request.    OBJECTIVE IMPAIRMENTS: decreased mobility, difficulty walking,  decreased ROM, decreased strength, hypomobility, increased fascial restrictions, increased muscle spasms, impaired flexibility, postural dysfunction, and pain.    ACTIVITY LIMITATIONS: carrying, lifting, bending, sitting, squatting, sleeping, stairs, bed mobility, reach over head, locomotion level, and caring for others   PARTICIPATION LIMITATIONS: meal prep, cleaning, laundry, community activity, and occupation   PERSONAL FACTORS: Profession and 1 comorbidity: chronic back and neck pain  are also affecting patient's functional outcome.    REHAB POTENTIAL: Excellent   CLINICAL DECISION MAKING: Stable/uncomplicated   EVALUATION COMPLEXITY: Low     GOALS:   LONG TERM GOALS: Target date: 09/03/2022       Patient will be independent with home exercise program for neck and low back including posture and core Baseline:  Goal status: INITIAL   2.  Patient will be able to have no neck  pain with normal ADLs Baseline:  Goal status: INITIAL   3.  Patient will notice improved ability to go up and down stairs in the morning due to decreased right sided back pain Baseline:  Goal status: INITIAL   4.  Patient will no longer have right-sided low back pain when transferring sit to stand or in bed Baseline:  Goal status: INITIAL   5.  Patient will demonstrate normal cervical active range of motion with stretch only Baseline:  Goal status: INITIAL   6.  Foto score will increase to 63% (lumbar) Baseline: 48% Goal status: INITIAL     PLAN:   PT FREQUENCY: 1-2x/week   PT DURATION: 6 weeks   PLANNED INTERVENTIONS: Therapeutic exercises, Therapeutic activity, Patient/Family education, Self Care, Joint mobilization, Dry Needling, Electrical stimulation, Spinal manipulation, Spinal mobilization, Cryotherapy, Moist heat, Manual therapy, and Re-evaluation   PLAN FOR NEXT SESSION: Check home exercise program lower body evaluation lumbar.  Progress core stability and observe postural faults  with functional activity.  Jannette Spanner, PTA 08/20/22 3:36 PM Phone: [phone] Fax: [phone]

## 2022-08-21 NOTE — Therapy (Signed)
OUTPATIENT PHYSICAL THERAPY TREATMENT NOTE   Patient Name: Paula [name] [name]: [id number] DOB:[birth year], 52 y.o., female Today's Date: 08/24/2022  PCP: Dr. Meredith Staggers   REFERRING PROVIDER: Dr. Meredith Staggers   END OF SESSION:   PT End of Session - 08/23/22 1522     Visit Number 6    Number of Visits 12    Date for PT Re-Evaluation 09/03/22    Authorization Type BCBS State    PT Start Time 1503    PT Stop Time 1545    PT Time Calculation (min) 42 min    Activity Tolerance Patient tolerated treatment well    Behavior During Therapy [facility] for tasks assessed/performed                Past Medical History:  Diagnosis Date   Allergy    Anemia    Past Surgical History:  Procedure Laterality Date   ABDOMINAL HYSTERECTOMY  11/26/2016   Procedure: HYSTERECTOMY ABDOMINAL;  Surgeon: Zelphia Cairo, MD;  Location: [facility];  Service: Gynecology;;   COLONOSCOPY WITH PROPOFOL N/A 09/22/2020   Procedure: COLONOSCOPY WITH PROPOFOL;  Surgeon: Rachael Fee, MD;  Location: [facility];  Service: Endoscopy;  Laterality: N/A;   NO PAST SURGERIES     POLYPECTOMY  09/22/2020   Procedure: POLYPECTOMY;  Surgeon: Rachael Fee, MD;  Location: [facility];  Service: Endoscopy;;   Patient Active Problem List   Diagnosis Date Noted   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   Pre-diabetes 03/02/2020   Vitamin D deficiency 03/02/2020   Cervicalgia 12/23/2019   Fibroids 11/26/2016   Amenorrhea 08/05/2012    REFERRING DIAG: M54.2 (ICD-10-CM) - Cervicalgia   THERAPY DIAG:  Cervicalgia  Other low back pain  Joint stiffness of spine  Rationale for Evaluation and Treatment Rehabilitation  ONSET DATE: chronic   SUBJECTIVE:                                                                                                                                                                                                          SUBJECTIVE STATEMENT: Massage, modalities, and therex provide  temporary relief from the tightness. Pt notes she is sleeping better at night.   PERTINENT HISTORY:  Not pertinent    PAIN:  Are you having pain? Yes: NPRS scale:5/10 Pain location: neck  Pain description: tight Aggravating factors: constant  Relieving factors: medicine, massage     PRECAUTIONS: None   WEIGHT BEARING RESTRICTIONS: No   FALLS:  Has patient fallen in last 6 months? No   LIVING ENVIRONMENT: Lives with: lives  with their familysons x 2 > 18 yrs and spouse  Lives in: House/apartment Stairs: Yes: Internal: 8 steps; on right going up Has following equipment at home: None   OCCUPATION: working full time [employer]   PLOF: Independent, Independent with household mobility without device, Independent with community mobility without device, Vocation/Vocational requirements: physical, lifting and pushing , and Leisure: being with family    PATIENT GOALS: I want to be able to have less pain    NEXT MD VISIT: unknown    OBJECTIVE: (objective measures completed at initial evaluation unless otherwise dated)   DIAGNOSTIC FINDINGS:  2022 MRI: Mild multilevel disc desiccation without significant disc height loss. Tiny central disc protrusions at C6-C7 and C7-T1. No significant spinal canal or foraminal stenosis   Imaging 04/06/16 - lumbar spine XR: FINDINGS: There is no evidence of fracture or subluxation. Vertebral bodies demonstrate normal height and alignment. Intervertebral disc spaces are preserved. The visualized neural foramina are grossly unremarkable in appearance.    PATIENT SURVEYS:  FOTO 48% lumbar none for neck    COGNITION: Overall cognitive status: Within functional limits for tasks assessed   SENSATION: WFL   POSTURE: rounded shoulders and forward head   PALPATION: Stiffness and pain felt throughout cervical spine with palpation.  Hypertonic along posterior lateral cervical and pain along suboccipitals.  Stiff with PA mobs in  cervicothoracic junction.    CERVICAL ROM:    Active ROM A/PROM (deg) eval AROM 08/24/22  Flexion 60 pain    Extension 35   Right lateral flexion 36 pain  35  Left lateral flexion 35 pain 35  Right rotation Tight approx 50 70  Left rotation Tight approx 50  60   (Blank rows = not tested) TRUNK ROM                        Lumbar flexion touch toes , ext 50% , rotation WFL and lateral flexion WNL  UPPER EXTREMITY ROM: WFLs  Active ROM Right eval Left eval  Shoulder flexion      Shoulder extension      Shoulder abduction      Shoulder adduction      Shoulder extension      Shoulder internal rotation      Shoulder external rotation      Elbow flexion      Elbow extension      Wrist flexion      Wrist extension      Wrist ulnar deviation      Wrist radial deviation      Wrist pronation      Wrist supination       (Blank rows = not tested)   UPPER EXTREMITY MMT: WFLs  MMT Right eval Left eval  Shoulder flexion      Shoulder extension      Shoulder abduction      Shoulder adduction      Shoulder extension      Shoulder internal rotation      Shoulder external rotation      Middle trapezius      Lower trapezius      Elbow flexion      Elbow extension      Wrist flexion      Wrist extension      Wrist ulnar deviation      Wrist radial deviation      Wrist pronation      Wrist supination  Grip strength       (Blank rows = not tested)   CERVICAL SPECIAL TESTS:  Neck flexor muscle endurance test: 15 sec  and Distraction test: improved pain  08/20/22: DNF endurance test: 30 sec    FUNCTIONAL TESTS:  90/90 hold isometric static with chin tuck 15 sec , back strain , loss of neutral  08/20/22: 90/90 hold isometric stab with chin tuck 30 sec, abdominal fatigue     TODAY'S TREATMENT:  [facility] Adult PT Treatment:                                                DATE: 08/23/22 Therapeutic Exercise: Supine chin tuck x5 5" DNF endurance  x5 5" sec  Supine shoulder  horizontal abduction star pattern GTB 2 x 10 Supine shoulder ER GTB 10 x 2  Doorway 90d pectoral stretch 3 each 20 Upper trap stretch x 2 each  Levator stretch x 2 each   Manual Therapy: STM/DTM to the bilat upper traps, cervical paraspinals, suboccipitals, levator Suboccipital release  [facility] Adult PT Treatment:                                                DATE: 08/20/22 Therapeutic Exercise: DNF endurance  30 sec  90/90 endurance  30 sec Seated chin tucks  Upper trap stretch x 2 each  Levator stretch x 2 each  Standing shoulder horizontal abduction Red band- back to wall for posture feedback 2 x 10 Standing shoulder ER with scap retract red band - back to wall , 10 x 2  Standing front raise to 90 with wooden dowel focusing on inhibition of upper traps- tactile cues required.   Modalities: Estim - (IFC) to tolerance and HMP to cervical x 15 minutes  Self Care: Sub occipital release using 2 tennis balls- 5 min x 2 (below and above occipital ridge)  Sleep positioning with  towel roll at neck    [facility] Adult PT Treatment:                                                DATE: 08/16/22 Therapeutic Exercise: Cervical extension SNAG x 10  Upper trap stretch x 20 sec each  Levator scap stretch x 20 sec each  Prone scapular retraction 2 x 10  Pec doorway stretch 2 x 30 sec  Bilateral shoulder ER yellow band 2 x 10  Supine cervical retraction 2 x 10  Manual Therapy: STM/DTM cervical paraspinals, upper traps, levator scapulae, suboccipitals.  Demo and returned demo of use of theracane and tennis ball for self-soft tissue mobilization   Modalities: premod x 10 mins c moist heat to the neck and upper shoulders, intensity as tolerated   [facility] Adult PT Treatment:                                                DATE: 08/14/22 Therapeutic Exercise: Standing Cervical Retraction with Towel 10 reps 5 hold  Standing Cervical Retraction with Sidebending  5 reps 15 hold Standing shoulder abd 2x10  GTB Standing shoulder ER 2x10 GTB Manual Therapy: STM/DTM to the bilat upper traps, cervical paraspinals, suboccipitals Suboccipital release Modalities: IFC/premod x15 mins c moist heat to the neck and upper shoulders, intensity as tolerated   PATIENT EDUCATION:  Education details: posture, TPDN, TENS unit, see treatment  Person educated: Patient Education method: Explanation Education comprehension: verbalized understanding   HOME EXERCISE PROGRAM: Access Code: [email or web address] URL: [email or web address] Date: 08/14/2022 Prepared by: Joellyn Rued  Exercises - Standing Cervical Retraction with Sidebending  - 1 x daily - 7 x weekly - 2 sets - 10 reps - 15-20 hold - Standing Cervical Rotation AROM with Overpressure  - 1 x daily - 7 x weekly - 2 sets - 10 reps - 15-20 hold - Supine Cervical Retraction with Towel  - 1 x daily - 7 x weekly - 2 sets - 10 reps - 5 hold - Standing Scapular Retraction  - 1 x daily - 7 x weekly - 2 sets - 10 reps - 5 hold - Standing Shoulder Horizontal Abduction with Resistance  - 1 x daily - 7 x weekly - 2 sets - 10 reps - 3 hold - Shoulder External Rotation and Scapular Retraction with Resistance  - 1 x daily - 7 x weekly - 2 sets - 10 reps - 3 hold - Correct Seated Posture  - 1-5 x daily - 7 x weekly - 1 sets   ASSESSMENT:   CLINICAL IMPRESSION: Patient reports the tightness of her neck has not significantly changed, although she does report sleeping better. Pt reports she has been using the paired tennis balls to massage her neck. Assessed pt's cervical side bending and rotation. Side bending AROM has not increased while rotation has made good improvement. PT was provided for STM/DTM to the upper trap, levator, cervical paraspinals and suboccipital muscles f/b therex for postural/posterior chain strengthening and cervical mobility. Pt appears to be receiving some benefit from PT with improved sleeping and cervical rotation ROM, however her reported  feeling of tightness of her neck has not improved. Will continue PT through pt's POC and then access continuation of PT vs DC.   OBJECTIVE IMPAIRMENTS: decreased mobility, difficulty walking, decreased ROM, decreased strength, hypomobility, increased fascial restrictions, increased muscle spasms, impaired flexibility, postural dysfunction, and pain.    ACTIVITY LIMITATIONS: carrying, lifting, bending, sitting, squatting, sleeping, stairs, bed mobility, reach over head, locomotion level, and caring for others   PARTICIPATION LIMITATIONS: meal prep, cleaning, laundry, community activity, and occupation   PERSONAL FACTORS: Profession and 1 comorbidity: chronic back and neck pain  are also affecting patient's functional outcome.    REHAB POTENTIAL: Excellent   CLINICAL DECISION MAKING: Stable/uncomplicated   EVALUATION COMPLEXITY: Low     GOALS:   LONG TERM GOALS: Target date: 09/03/2022       Patient will be independent with home exercise program for neck and low back including posture and core Baseline:  Goal status: Ongoing   2.  Patient will be able to have no neck pain with normal ADLs Baseline:  Status: Pt is primarily reporting tightness Goal status: Ongoing   3.  Patient will notice improved ability to go up and down stairs in the morning due to decreased right sided back pain Baseline:  Goal status: INITIAL   4.  Patient will no longer have right-sided low back pain when transferring sit to stand or in bed Baseline:  Goal status: INITIAL  5.  Patient will demonstrate normal cervical active range of motion with stretch only Baseline:  Status: 6/13//24=See flow sheets- rotation ROM is improved Goal status: Ongoing   6.  Foto score will increase to 63% (lumbar) Baseline: 48% Goal status: INITIAL     PLAN:   PT FREQUENCY: 1-2x/week   PT DURATION: 6 weeks   PLANNED INTERVENTIONS: Therapeutic exercises, Therapeutic activity, Patient/Family education, Self Care,  Joint mobilization, Dry Needling, Electrical stimulation, Spinal manipulation, Spinal mobilization, Cryotherapy, Moist heat, Manual therapy, and Re-evaluation   PLAN FOR NEXT SESSION: Check home exercise program lower body evaluation lumbar. Progress core stability and observe postural faults with functional activity.  Franz Svec MS, PT 08/24/22 6:35 AM

## 2022-08-23 ENCOUNTER — Ambulatory Visit: Payer: BC Managed Care – PPO

## 2022-08-23 DIAGNOSIS — M542 Cervicalgia: Secondary | ICD-10-CM | POA: Diagnosis not present

## 2022-08-23 DIAGNOSIS — M5459 Other low back pain: Secondary | ICD-10-CM

## 2022-08-23 DIAGNOSIS — M2560 Stiffness of unspecified joint, not elsewhere classified: Secondary | ICD-10-CM

## 2022-08-26 NOTE — Therapy (Unsigned)
OUTPATIENT PHYSICAL THERAPY TREATMENT NOTE   Patient Name: Paula [name] [name]: [id number] DOB:06/08/1970, 52 y.o., female Today's Date: 08/27/2022  PCP: Dr. Meredith Staggers   REFERRING PROVIDER: Dr. Meredith Staggers   END OF SESSION:   PT End of Session - 08/27/22 1508     Visit Number 7    Number of Visits 12    Date for PT Re-Evaluation 09/03/22    Authorization Type [employer] State    PT Start Time 1504    PT Stop Time 1545    PT Time Calculation (min) 41 min    Activity Tolerance Patient tolerated treatment well    Behavior During Therapy [facility] for tasks assessed/performed                 Past Medical History:  Diagnosis Date   Allergy    Anemia    Past Surgical History:  Procedure Laterality Date   ABDOMINAL HYSTERECTOMY  11/26/2016   Procedure: HYSTERECTOMY ABDOMINAL;  Surgeon: Zelphia Cairo, MD;  Location: [facility];  Service: Gynecology;;   COLONOSCOPY WITH PROPOFOL N/A 09/22/2020   Procedure: COLONOSCOPY WITH PROPOFOL;  Surgeon: Rachael Fee, MD;  Location: [facility];  Service: Endoscopy;  Laterality: N/A;   NO PAST SURGERIES     POLYPECTOMY  09/22/2020   Procedure: POLYPECTOMY;  Surgeon: Rachael Fee, MD;  Location: [facility];  Service: Endoscopy;;   Patient Active Problem List   Diagnosis Date Noted   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   Pre-diabetes 03/02/2020   Vitamin D deficiency 03/02/2020   Cervicalgia 12/23/2019   Fibroids 11/26/2016   Amenorrhea 08/05/2012    REFERRING DIAG: M54.2 (ICD-10-CM) - Cervicalgia   THERAPY DIAG:  Cervicalgia  Other low back pain  Joint stiffness of spine  Rationale for Evaluation and Treatment Rehabilitation  ONSET DATE: chronic   SUBJECTIVE:                                                                                                                                                                                                          SUBJECTIVE STATEMENT:  Pain continues  to have severe pain  at times. She is concerned about her pain .  She wants to get a brain MRI to see if there;s anything wrong. After her next appt she will see her MD 09/04/22.     PERTINENT HISTORY:  Not pertinent    PAIN:  Are you having pain? Yes: NPRS scale:8/10 Pain location: neck  Pain description: tight Aggravating factors: constant  Relieving factors: medicine, massage   Back is 6/10  PRECAUTIONS: None   WEIGHT BEARING RESTRICTIONS: No   FALLS:  Has patient fallen in last 6 months? No   LIVING ENVIRONMENT: Lives with: lives with their familysons x 2 > 18 yrs and spouse  Lives in: House/apartment Stairs: Yes: Internal: 8 steps; on right going up Has following equipment at home: None   OCCUPATION: working full time [employer]   PLOF: Independent, Independent with household mobility without device, Independent with community mobility without device, Vocation/Vocational requirements: physical, lifting and pushing , and Leisure: being with family    PATIENT GOALS: I want to be able to have less pain    NEXT MD VISIT: unknown    OBJECTIVE: (objective measures completed at initial evaluation unless otherwise dated)   DIAGNOSTIC FINDINGS:  2022 MRI: Mild multilevel disc desiccation without significant disc height loss. Tiny central disc protrusions at C6-C7 and C7-T1. No significant spinal canal or foraminal stenosis   Imaging 04/06/16 - lumbar spine XR: FINDINGS: There is no evidence of fracture or subluxation. Vertebral bodies demonstrate normal height and alignment. Intervertebral disc spaces are preserved. The visualized neural foramina are grossly unremarkable in appearance.    PATIENT SURVEYS:  FOTO 48% lumbar none for neck    COGNITION: Overall cognitive status: Within functional limits for tasks assessed   SENSATION: WFL   POSTURE: rounded shoulders and forward head   PALPATION: Stiffness and pain felt throughout cervical spine with palpation.  Hypertonic  along posterior lateral cervical and pain along suboccipitals.  Stiff with PA mobs in cervicothoracic junction.    CERVICAL ROM:    Active ROM A/PROM (deg) eval AROM 08/24/22  Flexion 60 pain    Extension 35   Right lateral flexion 36 pain  35  Left lateral flexion 35 pain 35  Right rotation Tight approx 50 70  Left rotation Tight approx 50  60   (Blank rows = not tested) TRUNK ROM                        Lumbar flexion touch toes , ext 50% , rotation WFL and lateral flexion WNL  UPPER EXTREMITY ROM: WFLs  Active ROM Right eval Left eval  Shoulder flexion      Shoulder extension      Shoulder abduction      Shoulder adduction      Shoulder extension      Shoulder internal rotation      Shoulder external rotation      Elbow flexion      Elbow extension      Wrist flexion      Wrist extension      Wrist ulnar deviation      Wrist radial deviation      Wrist pronation      Wrist supination       (Blank rows = not tested)   UPPER EXTREMITY MMT: WFLs  MMT Right eval Left eval  Shoulder flexion      Shoulder extension      Shoulder abduction      Shoulder adduction      Shoulder extension      Shoulder internal rotation      Shoulder external rotation      Middle trapezius      Lower trapezius      Elbow flexion      Elbow extension      Wrist flexion      Wrist extension      Wrist ulnar  deviation      Wrist radial deviation      Wrist pronation      Wrist supination      Grip strength       (Blank rows = not tested)   CERVICAL SPECIAL TESTS:  Neck flexor muscle endurance test: 15 sec  and Distraction test: improved pain  08/20/22: DNF endurance test: 30 sec    FUNCTIONAL TESTS:  90/90 hold isometric static with chin tuck 15 sec , back strain , loss of neutral  08/20/22: 90/90 hold isometric stab with chin tuck 30 sec, abdominal fatigue     TODAY'S TREATMENT:   [facility] Adult PT Treatment:                                                DATE:  08/27/22 Therapeutic Exercise: Supine head turns with LTR Chin tuck with horizontal pull green band Alternating UE and LE  Scapular retraction  Lower abdominals contraction with deep breathing  Bridge x 10  Standing row  3 plates x 15 , mod cues  Standing extension max cues 4 plates  Palloff press 2 plates  x 10 max cues  Squat curl to OH press 4 lbs x 12  ER red band x 15  Narrow looped band overhead ball behind back  Wall squats x 10     [facility] Adult PT Treatment:                                                DATE: 08/23/22 Therapeutic Exercise: Supine chin tuck x5 5" DNF endurance  x5 5" sec  Supine shoulder horizontal abduction star pattern GTB 2 x 10 Supine shoulder ER GTB 10 x 2  Doorway 90d pectoral stretch 3 each 20 Upper trap stretch x 2 each  Levator stretch x 2 each   Manual Therapy: STM/DTM to the bilat upper traps, cervical paraspinals, suboccipitals, levator Suboccipital release  [facility] Adult PT Treatment:                                                DATE: 08/20/22 Therapeutic Exercise: DNF endurance  30 sec  90/90 endurance  30 sec Seated chin tucks  Upper trap stretch x 2 each  Levator stretch x 2 each  Standing shoulder horizontal abduction Red band- back to wall for posture feedback 2 x 10 Standing shoulder ER with scap retract red band - back to wall , 10 x 2  Standing front raise to 90 with wooden dowel focusing on inhibition of upper traps- tactile cues required.   Modalities: Estim - (IFC) to tolerance and HMP to cervical x 15 minutes  Self Care: Sub occipital release using 2 tennis balls- 5 min x 2 (below and above occipital ridge)  Sleep positioning with  towel roll at neck    [facility] Adult PT Treatment:  DATE: 08/16/22 Therapeutic Exercise: Cervical extension SNAG x 10  Upper trap stretch x 20 sec each  Levator scap stretch x 20 sec each  Prone scapular retraction 2 x 10  Pec doorway stretch 2 x 30  sec  Bilateral shoulder ER yellow band 2 x 10  Supine cervical retraction 2 x 10  Manual Therapy: STM/DTM cervical paraspinals, upper traps, levator scapulae, suboccipitals.  Demo and returned demo of use of theracane and tennis ball for self-soft tissue mobilization   Modalities: premod x 10 mins c moist heat to the neck and upper shoulders, intensity as tolerated   [facility] Adult PT Treatment:                                                DATE: 08/14/22 Therapeutic Exercise: Standing Cervical Retraction with Towel 10 reps 5 hold Standing Cervical Retraction with Sidebending  5 reps 15 hold Standing shoulder abd 2x10 GTB Standing shoulder ER 2x10 GTB Manual Therapy: STM/DTM to the bilat upper traps, cervical paraspinals, suboccipitals Suboccipital release Modalities: IFC/premod x15 mins c moist heat to the neck and upper shoulders, intensity as tolerated   PATIENT EDUCATION:  Education details: posture, TPDN, TENS unit, see treatment  Person educated: Patient Education method: Explanation Education comprehension: verbalized understanding   HOME EXERCISE PROGRAM: Access Code: [email or web address] URL: [email or web address] Date: 08/14/2022 Prepared by: Joellyn Rued  Exercises - Standing Cervical Retraction with Sidebending  - 1 x daily - 7 x weekly - 2 sets - 10 reps - 15-20 hold - Standing Cervical Rotation AROM with Overpressure  - 1 x daily - 7 x weekly - 2 sets - 10 reps - 15-20 hold - Supine Cervical Retraction with Towel  - 1 x daily - 7 x weekly - 2 sets - 10 reps - 5 hold - Standing Scapular Retraction  - 1 x daily - 7 x weekly - 2 sets - 10 reps - 5 hold - Standing Shoulder Horizontal Abduction with Resistance  - 1 x daily - 7 x weekly - 2 sets - 10 reps - 3 hold - Shoulder External Rotation and Scapular Retraction with Resistance  - 1 x daily - 7 x weekly - 2 sets - 10 reps - 3 hold - Correct Seated Posture  - 1-5 x daily - 7 x weekly - 1 sets   ASSESSMENT:    CLINICAL IMPRESSION: Patient reporting min to no improvement since beginning PT.  She came in with pain 6/10-8/10 but was able to exercise without any indication of pain.  She needs mod to max cues for proper core contraction, full body integrated with standing exercises.  She has 1 more visit and will consider plan. She suggests seeing MD and then finishing PT.     OBJECTIVE IMPAIRMENTS: decreased mobility, difficulty walking, decreased ROM, decreased strength, hypomobility, increased fascial restrictions, increased muscle spasms, impaired flexibility, postural dysfunction, and pain.    ACTIVITY LIMITATIONS: carrying, lifting, bending, sitting, squatting, sleeping, stairs, bed mobility, reach over head, locomotion level, and caring for others   PARTICIPATION LIMITATIONS: meal prep, cleaning, laundry, community activity, and occupation   PERSONAL FACTORS: Profession and 1 comorbidity: chronic back and neck pain  are also affecting patient's functional outcome.    REHAB POTENTIAL: Excellent   CLINICAL DECISION MAKING: Stable/uncomplicated   EVALUATION COMPLEXITY: Low     GOALS:   LONG TERM  GOALS: Target date: 09/03/2022       Patient will be independent with home exercise program for neck and low back including posture and core Baseline:  Goal status: Ongoing   2.  Patient will be able to have no neck pain with normal ADLs Baseline:  Status: Pt is primarily reporting tightness Goal status: Ongoing   3.  Patient will notice improved ability to go up and down stairs in the morning due to decreased right sided back pain Baseline:  Goal status: INITIAL   4.  Patient will no longer have right-sided low back pain when transferring sit to stand or in bed Baseline:  Goal status: INITIAL   5.  Patient will demonstrate normal cervical active range of motion with stretch only Baseline:  Status: 6/13//24=See flow sheets- rotation ROM is improved Goal status: Ongoing   6.  Foto score  will increase to 63% (lumbar) Baseline: 48% Goal status: INITIAL     PLAN:   PT FREQUENCY: 1-2x/week   PT DURATION: 6 weeks   PLANNED INTERVENTIONS: Therapeutic exercises, Therapeutic activity, Patient/Family education, Self Care, Joint mobilization, Dry Needling, Electrical stimulation, Spinal manipulation, Spinal mobilization, Cryotherapy, Moist heat, Manual therapy, and Re-evaluation   PLAN FOR NEXT SESSION: DC vs renew? Functional strengthening and standing was well-tolerated today repeat and consider adding in lower core.  TENS Unit therapeutic massage referral?  Karie Mainland, PT 08/27/22 3:44 PM Phone: [phone] Fax: [phone]

## 2022-08-27 ENCOUNTER — Ambulatory Visit: Payer: BC Managed Care – PPO | Admitting: Family Medicine

## 2022-08-27 ENCOUNTER — Ambulatory Visit: Payer: BC Managed Care – PPO | Admitting: Physical Therapy

## 2022-08-27 ENCOUNTER — Encounter: Payer: Self-pay | Admitting: Physical Therapy

## 2022-08-27 DIAGNOSIS — M5459 Other low back pain: Secondary | ICD-10-CM

## 2022-08-27 DIAGNOSIS — M542 Cervicalgia: Secondary | ICD-10-CM | POA: Diagnosis not present

## 2022-08-27 DIAGNOSIS — M2560 Stiffness of unspecified joint, not elsewhere classified: Secondary | ICD-10-CM

## 2022-08-30 ENCOUNTER — Ambulatory Visit: Payer: BC Managed Care – PPO | Admitting: Physical Therapy

## 2022-08-30 ENCOUNTER — Encounter: Payer: Self-pay | Admitting: Physical Therapy

## 2022-08-30 DIAGNOSIS — M542 Cervicalgia: Secondary | ICD-10-CM | POA: Diagnosis not present

## 2022-08-30 DIAGNOSIS — M2560 Stiffness of unspecified joint, not elsewhere classified: Secondary | ICD-10-CM

## 2022-08-30 DIAGNOSIS — M5459 Other low back pain: Secondary | ICD-10-CM

## 2022-08-30 NOTE — Therapy (Addendum)
OUTPATIENT PHYSICAL THERAPY TREATMENT NOTE DISCHARGE    PHYSICAL THERAPY DISCHARGE SUMMARY  Visits from Start of Care: 8  Current functional level related to goals / functional outcomes: See below   Remaining deficits: See below    Education / Equipment: HEP    Patient agrees to discharge. Patient goals were not met. Patient is being discharged due to not returning since the last visit.  Patient Name: Paula [name] [name]: [id number] DOB:12/02/1970, 52 y.o., female Today's Date: 08/30/2022  PCP: Dr. Meredith Staggers   REFERRING PROVIDER: Dr. Meredith Staggers   END OF SESSION:   PT End of Session - 08/30/22 1404     Visit Number 8    Number of Visits 12    Date for PT Re-Evaluation 09/03/22    Authorization Type [employer] State    PT Start Time 1402    PT Stop Time 1432   requested to leave early   PT Time Calculation (min) 30 min                 Past Medical History:  Diagnosis Date   Allergy    Anemia    Past Surgical History:  Procedure Laterality Date   ABDOMINAL HYSTERECTOMY  11/26/2016   Procedure: HYSTERECTOMY ABDOMINAL;  Surgeon: Zelphia Cairo, MD;  Location: [facility];  Service: Gynecology;;   COLONOSCOPY WITH PROPOFOL N/A 09/22/2020   Procedure: COLONOSCOPY WITH PROPOFOL;  Surgeon: Rachael Fee, MD;  Location: [facility];  Service: Endoscopy;  Laterality: N/A;   NO PAST SURGERIES     POLYPECTOMY  09/22/2020   Procedure: POLYPECTOMY;  Surgeon: Rachael Fee, MD;  Location: [facility];  Service: Endoscopy;;   Patient Active Problem List   Diagnosis Date Noted   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   Pre-diabetes 03/02/2020   Vitamin D deficiency 03/02/2020   Cervicalgia 12/23/2019   Fibroids 11/26/2016   Amenorrhea 08/05/2012    REFERRING DIAG: M54.2 (ICD-10-CM) - Cervicalgia   THERAPY DIAG:  Cervicalgia  Other low back pain  Joint stiffness of spine  Rationale for Evaluation and Treatment Rehabilitation  ONSET DATE: chronic    SUBJECTIVE:                                                                                                                                                                                                          SUBJECTIVE STATEMENT: Pain is not as bad today. I have not taken any medicines.   I want to talk  to MD 09/04/22 then decide if I will continue.    PERTINENT HISTORY:  Not pertinent    PAIN:  Are you having pain? Yes: NPRS scale:6/10 Pain location: neck  Pain description: tight Aggravating factors: constant  Relieving factors: medicine, massage   Back is 5/10     PRECAUTIONS: None   WEIGHT BEARING RESTRICTIONS: No   FALLS:  Has patient fallen in last 6 months? No   LIVING ENVIRONMENT: Lives with: lives with their familysons x 2 > 18 yrs and spouse  Lives in: House/apartment Stairs: Yes: Internal: 8 steps; on right going up Has following equipment at home: None   OCCUPATION: working full time [employer]   PLOF: Independent, Independent with household mobility without device, Independent with community mobility without device, Vocation/Vocational requirements: physical, lifting and pushing , and Leisure: being with family    PATIENT GOALS: I want to be able to have less pain    NEXT MD VISIT: unknown    OBJECTIVE: (objective measures completed at initial evaluation unless otherwise dated)   DIAGNOSTIC FINDINGS:  2022 MRI: Mild multilevel disc desiccation without significant disc height loss. Tiny central disc protrusions at C6-C7 and C7-T1. No significant spinal canal or foraminal stenosis   Imaging 04/06/16 - lumbar spine XR: FINDINGS: There is no evidence of fracture or subluxation. Vertebral bodies demonstrate normal height and alignment. Intervertebral disc spaces are preserved. The visualized neural foramina are grossly unremarkable in appearance.    PATIENT SURVEYS:  FOTO 48% lumbar none for neck  FOTO 70% lumbar 08/30/22    COGNITION: Overall cognitive status: Within functional limits for tasks assessed   SENSATION: WFL   POSTURE: rounded shoulders and forward head   PALPATION: Stiffness and pain felt throughout cervical spine with palpation.  Hypertonic along posterior lateral cervical and pain along suboccipitals.  Stiff with PA mobs in cervicothoracic junction.    CERVICAL ROM:    Active ROM A/PROM (deg) eval AROM 08/24/22 AROM 6/20//24  Flexion 60 pain     Extension 35    Right lateral flexion 36 pain  35 38  Left lateral flexion 35 pain 35 38  Right rotation Tight approx 50 70 60  Left rotation Tight approx 50  60 60   (Blank rows = not tested) TRUNK ROM                        Lumbar flexion touch toes , ext 50% , rotation WFL and lateral flexion WNL  UPPER EXTREMITY ROM: WFLs  Active ROM Right eval Left eval  Shoulder flexion      Shoulder extension      Shoulder abduction      Shoulder adduction      Shoulder extension      Shoulder internal rotation      Shoulder external rotation      Elbow flexion      Elbow extension      Wrist flexion      Wrist extension      Wrist ulnar deviation      Wrist radial deviation      Wrist pronation      Wrist supination       (Blank rows = not tested)   UPPER EXTREMITY MMT: WFLs  MMT Right eval Left eval  Shoulder flexion      Shoulder extension      Shoulder abduction      Shoulder adduction      Shoulder extension      Shoulder internal rotation      Shoulder external  rotation      Middle trapezius      Lower trapezius      Elbow flexion      Elbow extension      Wrist flexion      Wrist extension      Wrist ulnar deviation      Wrist radial deviation      Wrist pronation      Wrist supination      Grip strength       (Blank rows = not tested)   CERVICAL SPECIAL TESTS:  Neck flexor muscle endurance test: 15 sec  and Distraction test: improved pain  08/20/22: DNF endurance test: 30 sec    FUNCTIONAL TESTS:  90/90  hold isometric static with chin tuck 15 sec , back strain , loss of neutral  08/20/22: 90/90 hold isometric stab with chin tuck 30 sec, abdominal fatigue     TODAY'S TREATMENT:  [facility] Adult PT Treatment:                                                DATE: 08/30/22 Therapeutic Exercise: Supine chin tuck -towel roll LTR SKTC 90/90 ab brace 10 sec x 5  FOTO Updated HEP    [facility] Adult PT Treatment:                                                DATE: 08/27/22 Therapeutic Exercise: Supine head turns with LTR Chin tuck with horizontal pull green band Alternating UE and LE  Scapular retraction  Lower abdominals contraction with deep breathing  Bridge x 10  Standing row  3 plates x 15 , mod cues  Standing extension max cues 4 plates  Palloff press 2 plates  x 10 max cues  Squat curl to OH press 4 lbs x 12  ER red band x 15  Narrow looped band overhead ball behind back  Wall squats x 10     [facility] Adult PT Treatment:                                                DATE: 08/23/22 Therapeutic Exercise: Supine chin tuck x5 5" DNF endurance  x5 5" sec  Supine shoulder horizontal abduction star pattern GTB 2 x 10 Supine shoulder ER GTB 10 x 2  Doorway 90d pectoral stretch 3 each 20 Upper trap stretch x 2 each  Levator stretch x 2 each   Manual Therapy: STM/DTM to the bilat upper traps, cervical paraspinals, suboccipitals, levator Suboccipital release  [facility] Adult PT Treatment:                                                DATE: 08/20/22 Therapeutic Exercise: DNF endurance  30 sec  90/90 endurance  30 sec Seated chin tucks  Upper trap stretch x 2 each  Levator stretch x 2 each  Standing shoulder horizontal abduction Red band- back to wall for posture feedback 2 x  10 Standing shoulder ER with scap retract red band - back to wall , 10 x 2  Standing front raise to 90 with wooden dowel focusing on inhibition of upper traps- tactile cues required.   Modalities: Estim - (IFC) to  tolerance and HMP to cervical x 15 minutes  Self Care: Sub occipital release using 2 tennis balls- 5 min x 2 (below and above occipital ridge)  Sleep positioning with  towel roll at neck    [facility] Adult PT Treatment:                                                DATE: 08/16/22 Therapeutic Exercise: Cervical extension SNAG x 10  Upper trap stretch x 20 sec each  Levator scap stretch x 20 sec each  Prone scapular retraction 2 x 10  Pec doorway stretch 2 x 30 sec  Bilateral shoulder ER yellow band 2 x 10  Supine cervical retraction 2 x 10  Manual Therapy: STM/DTM cervical paraspinals, upper traps, levator scapulae, suboccipitals.  Demo and returned demo of use of theracane and tennis ball for self-soft tissue mobilization   Modalities: premod x 10 mins c moist heat to the neck and upper shoulders, intensity as tolerated   [facility] Adult PT Treatment:                                                DATE: 08/14/22 Therapeutic Exercise: Standing Cervical Retraction with Towel 10 reps 5 hold Standing Cervical Retraction with Sidebending  5 reps 15 hold Standing shoulder abd 2x10 GTB Standing shoulder ER 2x10 GTB Manual Therapy: STM/DTM to the bilat upper traps, cervical paraspinals, suboccipitals Suboccipital release Modalities: IFC/premod x15 mins c moist heat to the neck and upper shoulders, intensity as tolerated   PATIENT EDUCATION:  Education details: posture, TPDN, TENS unit, see treatment  Person educated: Patient Education method: Explanation Education comprehension: verbalized understanding   HOME EXERCISE PROGRAM: Access Code: [email or web address] URL: [email or web address] Date: 08/14/2022 Prepared by: Joellyn Rued  Exercises - Standing Cervical Retraction with Sidebending  - 1 x daily - 7 x weekly - 2 sets - 10 reps - 15-20 hold - Standing Cervical Rotation AROM with Overpressure  - 1 x daily - 7 x weekly - 2 sets - 10 reps - 15-20 hold - Supine Cervical Retraction with  Towel  - 1 x daily - 7 x weekly - 2 sets - 10 reps - 5 hold - Standing Scapular Retraction  - 1 x daily - 7 x weekly - 2 sets - 10 reps - 5 hold - Standing Shoulder Horizontal Abduction with Resistance  - 1 x daily - 7 x weekly - 2 sets - 10 reps - 3 hold - Shoulder External Rotation and Scapular Retraction with Resistance  - 1 x daily - 7 x weekly - 2 sets - 10 reps - 3 hold - Correct Seated Posture  - 1-5 x daily - 7 x weekly - 1 sets Added  Lower trunk rotation SKTC 90/90 ab bracing   ASSESSMENT:   CLINICAL IMPRESSION: Patient reports feeling okay today, rates neck and back at 5-6/10, then describes her pain as intermittent for neck and back. Back pain is less frequent. Neck pain  occurs every morning and she must massage herself to get relief. Neck remains tight throughout day.  Her AROM cervical has improved and she is independent with her HEP. Updated HEP today to include familiar lumbar stretches and lower core strengthening as this might be her last day of PT. She would like to HOLD PT for now until she discussed her treatment plan with her MD next week. She will call to schedule more PT if needed.    min to no improvement since beginning PT.  She came in with pain 6/10-8/10 but was able to exercise without any indication of pain.  She needs mod to max cues for proper core contraction, full body integrated with standing exercises.  She has 1 more visit and will consider plan. She suggests seeing MD and then finishing PT.     OBJECTIVE IMPAIRMENTS: decreased mobility, difficulty walking, decreased ROM, decreased strength, hypomobility, increased fascial restrictions, increased muscle spasms, impaired flexibility, postural dysfunction, and pain.    ACTIVITY LIMITATIONS: carrying, lifting, bending, sitting, squatting, sleeping, stairs, bed mobility, reach over head, locomotion level, and caring for others   PARTICIPATION LIMITATIONS: meal prep, cleaning, laundry, community activity, and  occupation   PERSONAL FACTORS: Profession and 1 comorbidity: chronic back and neck pain  are also affecting patient's functional outcome.    REHAB POTENTIAL: Excellent   CLINICAL DECISION MAKING: Stable/uncomplicated   EVALUATION COMPLEXITY: Low     GOALS:   LONG TERM GOALS: Target date: 09/03/2022       Patient will be independent with home exercise program for neck and low back including posture and core Baseline:  Goal status: Ongoing   2.  Patient will be able to have no neck pain with normal ADLs Baseline:  Status: Pt is primarily reporting tightness 08/30/22: tightness in the morning , no pain with activity Goal status: Ongoing   3.  Patient will notice improved ability to go up and down stairs in the morning due to decreased right sided back pain Baseline:  08/30/22: will intermittently have pain going down stairs. Can go a week or 2 without pain Goal status: ONGOING   4.  Patient will no longer have right-sided low back pain when transferring sit to stand or in bed Baseline:  08/30/22: Continued pain with transfers Goal status: ONGOING   5.  Patient will demonstrate normal cervical active range of motion with stretch only Baseline:  Status: 6/13//24=See flow sheets- rotation ROM is improved 08/30/22: WFL Goal status: PARTIALLY MET   6.  Foto score will increase to 63% (lumbar) Baseline: 48% 08/30/22: 70%  Goal status: MET     PLAN:   PT FREQUENCY: 1-2x/week   PT DURATION: 6 weeks   PLANNED INTERVENTIONS: Therapeutic exercises, Therapeutic activity, Patient/Family education, Self Care, Joint mobilization, Dry Needling, Electrical stimulation, Spinal manipulation, Spinal mobilization, Cryotherapy, Moist heat, Manual therapy, and Re-evaluation   PLAN FOR NEXT SESSION: HOLD until MD appt    Karie Mainland, PT 08/30/22 2:37 PM Phone: [phone] Fax: [phone]

## 2022-10-17 ENCOUNTER — Other Ambulatory Visit: Payer: Self-pay | Admitting: Neurosurgery

## 2022-10-17 DIAGNOSIS — M542 Cervicalgia: Secondary | ICD-10-CM

## 2022-10-17 DIAGNOSIS — R519 Headache, unspecified: Secondary | ICD-10-CM

## 2022-11-08 ENCOUNTER — Encounter: Payer: Self-pay | Admitting: Neurosurgery

## 2022-11-13 ENCOUNTER — Ambulatory Visit
Admission: RE | Admit: 2022-11-13 | Discharge: 2022-11-13 | Disposition: A | Discharge status: Home / Self Care | Payer: BC Managed Care – PPO | Source: Ambulatory Visit | Attending: Neurosurgery | Admitting: Neurosurgery

## 2022-11-13 DIAGNOSIS — R519 Headache, unspecified: Secondary | ICD-10-CM

## 2022-11-13 DIAGNOSIS — M542 Cervicalgia: Secondary | ICD-10-CM

## 2023-01-11 ENCOUNTER — Encounter (INDEPENDENT_AMBULATORY_CARE_PROVIDER_SITE_OTHER): Payer: Self-pay | Admitting: Otolaryngology

## 2023-03-28 ENCOUNTER — Telehealth: Payer: Self-pay | Admitting: Family Medicine

## 2023-03-28 NOTE — Telephone Encounter (Signed)
Type of form received:[employer]   Additional comments:   Received ZO:[id number] - Front Desk   Form should be Faxed/mailed to: N/A  Is patient requesting call for pickup:N/A  Form placed: [employer] charge sheet.  Provider will determine charge. N/A  Individual made aware of 3-5 business day turn around No?

## 2023-03-28 NOTE — Telephone Encounter (Signed)
Signed. Placed in scan bin.

## 2023-03-28 NOTE — Telephone Encounter (Signed)
Placed in folder at nurse station

## 2023-08-22 ENCOUNTER — Ambulatory Visit: Payer: Self-pay

## 2023-08-22 NOTE — Telephone Encounter (Signed)
 Okay for patient to wait one week?

## 2023-08-22 NOTE — Telephone Encounter (Signed)
 Yes, okay to wait 1 week.

## 2023-08-22 NOTE — Telephone Encounter (Signed)
 FYI Only or Action Required?: Action required by provider  Patient was last seen in primary care on 08/09/2022 by Jess Morita, MD. Called Nurse Triage reporting Back Pain. Symptoms began several months ago. Interventions attempted: Prescription medications: Meloxicam , Robaxin . Symptoms are: neck pain, lower back pain radiating down both legs, right flank pain, whole body pain gradually worsening.  Triage Disposition: See HCP Within 4 Hours (Or PCP Triage)- Refused  Patient/caregiver understands and will follow disposition?: No                Copied from CRM [id number]. Topic: Clinical - Red Word Triage >> Aug 22, 2023 10:15 AM Luane Rumps D wrote: Red Word that prompted transfer to Nurse Triage: Patient is having pain on back where kidneys are, rates pain 6-7/10 when active pain. Patient does not want to take medication anymore. Feels pain in legs while sitting too and when has to wait 5 minutes before she can get up to walk again.   ----------------------------------------------------------------------- From previous Reason for Contact - Scheduling: Patient/patient representative is calling to schedule an appointment. Refer to attachments for appointment information. Reason for Disposition  [1] Pain radiates into the thigh or further down the leg AND [2] both legs  Answer Assessment - Initial Assessment Questions 1. ONSET: When did the pain begin?      She states roughly about 6 months, gradually worsening.  2. LOCATION: Where does it hurt? (upper, mid or lower back)     Lower back pain, flank area and worse on right side.  3. SEVERITY: How bad is the pain?  (e.g., Scale 1-10; mild, moderate, or severe)   - MILD (1-3): Doesn't interfere with normal activities.    - MODERATE (4-7): Interferes with normal activities or awakens from sleep.    - SEVERE (8-10): Excruciating pain, unable to do any normal activities.      6/10. Worse when sitting down. Better if standing  or walking.  4. PATTERN: Is the pain constant? (e.g., yes, no; constant, intermittent)      Constant.  5. RADIATION: Does the pain shoot into your legs or somewhere else?     Both legs.  6. CAUSE:  What do you think is causing the back pain?      She states she has had issues with her back for a while and saw her PCP and was prescribed medication for it but she is worried if it is her kidneys.  7. BACK OVERUSE:  Any recent lifting of heavy objects, strenuous work or exercise?     She states she walks a lot.  8. MEDICINES: What have you taken so far for the pain? (e.g., nothing, acetaminophen , NSAIDS)     Meloxicam , Robaxin .  9. NEUROLOGIC SYMPTOMS: Do you have any weakness, numbness, or problems with bowel/bladder control?     No.  10. OTHER SYMPTOMS: Do you have any other symptoms? (e.g., fever, abdomen pain, burning with urination, blood in urine)       Neck pain, whole body pain.  11. PREGNANCY: Is there any chance you are pregnant? When was your last menstrual period?       N/A.  Patient denies any blood in urine, burning with urination or loss of bowel or bladder control. Offered acute visits this afternoon with available providers, patient refused due to she states she has appointments for her children. She requested an appointment in a week. Advised PCP would need to review symptoms and approve scheduling that far out due to recommended  disposition see HCP within 4 hours.  Protocols used: Back Pain-A-AH

## 2023-09-16 ENCOUNTER — Ambulatory Visit (INDEPENDENT_AMBULATORY_CARE_PROVIDER_SITE_OTHER): Payer: Self-pay | Admitting: Student in an Organized Health Care Education/Training Program

## 2023-09-16 ENCOUNTER — Ambulatory Visit (HOSPITAL_BASED_OUTPATIENT_CLINIC_OR_DEPARTMENT_OTHER)
Admission: RE | Admit: 2023-09-16 | Discharge: 2023-09-16 | Disposition: A | Discharge status: Home / Self Care | Source: Ambulatory Visit | Attending: Student in an Organized Health Care Education/Training Program | Admitting: Student in an Organized Health Care Education/Training Program

## 2023-09-16 ENCOUNTER — Encounter: Payer: Self-pay | Admitting: Student in an Organized Health Care Education/Training Program

## 2023-09-16 VITALS — BP 130/79 | HR 73 | Wt 156.0 lb

## 2023-09-16 DIAGNOSIS — G8929 Other chronic pain: Secondary | ICD-10-CM | POA: Diagnosis present

## 2023-09-16 DIAGNOSIS — M5441 Lumbago with sciatica, right side: Secondary | ICD-10-CM | POA: Insufficient documentation

## 2023-09-16 DIAGNOSIS — M25551 Pain in right hip: Secondary | ICD-10-CM | POA: Diagnosis present

## 2023-09-16 NOTE — Assessment & Plan Note (Signed)
 Acute issue.  She has a low back pain syndrome, but on exam she has focal tenderness over the right greater trochanter.  I think this is probably just a part of her overall discomfort.  Probably not the main culprit.  Would like to get an x-ray of the right hip, the exam is reassuring but want to rule out fully right hip osteoarthritis.  I offered her steroid injection into the right greater trochanteric bursa, declines for now.  No signs of glue medius tear.  Follow-up with me in 4 weeks, I think a trial of steroid injection to the right greater trochanter would be helpful.

## 2023-09-16 NOTE — Progress Notes (Signed)
 Acute Office Visit  Subjective:     Patient ID: Paula [name], female    DOB: Dec 30, 1970, 53 y.o.   [name]: [id number]  Chief Complaint  Patient presents with   Back Pain    Left side pain that is more towards the back. Patient has tried muscle relaxer but nothing is helping and physical therapy not helping.     HPI  Discussed the use of AI scribe software for clinical note transcription with the patient, who gave verbal consent to proceed.  History of Present Illness Paula [name] is a 53 year old female who presents with chronic right lower back pain radiating to the leg.  She has been experiencing persistent right lower back pain for over a year, radiating down her leg to her foot, sometimes causing numbness. The pain is described as a dull ache that worsens after prolonged sitting or upon waking, requiring her to stand before moving comfortably.  She has tried various treatments including Robaxin , a muscle relaxer, which provides temporary relief during the day but results in neck stiffness by the next morning. She has also used Aleve but is concerned about potential side effects with prolonged use. Physical therapy has not provided significant relief.  The pain impacts her daily activities, making it difficult to move after sitting or lying down, although she remains functional and continues to work. No fever, chills, falls, or changes in urination or bowel movements. She reports numbness in her foot associated with the back pain.  She has a history of undergoing a sleep study and uses a mouth guard for sleep issues. She has undergone an MRI for head pain, which was reported as normal.       Objective:    BP 130/79   Pulse 73   Wt 156 lb (70.8 kg)   LMP 11/26/2016   SpO2 100%   BMI 28.53 kg/m   Physical Exam  Physical Exam CHEST: Lungs clear to auscultation, no crackles. CARDIOVASCULAR: Heart sounds normal, no murmur. MUSCULOSKELETAL: Back normal on inspection. No  pain with external rotation at right hip, good range of motion bilaterally. Tenderness over right greater trochanter. No tenderness over lumbar spine, tenderness on right side.  Negative straight leg raise test bilaterally.        Assessment & Plan:    Problem List Items Addressed This Visit       Unprioritized   Chronic right-sided low back pain with right-sided sciatica - Primary   Chronic low back pain radiating to the right leg with foot numbness. Differential includes lumbar spine arthritis, nerve impingement, hip pathology, and greater trochanteric pain syndrome. Pain unresponsive to muscle relaxants or physical therapy. Discussed imaging to guide treatment, including steroid injections or surgery.  - Order MRI of the lumbar spine to assess for arthritis or nerve impingement. - Order X-ray of the right hip to evaluate for hip joint pathology. - Discuss potential for steroid injection at the greater trochanter if imaging supports this as the pain source. - Advise against chiropractic treatment until further evaluation is completed. - Schedule MRI of the lumbar spine in the next few weeks. - Instruct to obtain X-ray of the hip at the designated facility as soon as possible. - Follow up with results to discuss further management options.      Relevant Orders   DG Hip Unilat W OR W/O Pelvis 2-3 Views Right   MR Lumbar Spine Wo Contrast   Greater trochanteric pain syndrome of right lower extremity  Acute issue.  She has a low back pain syndrome, but on exam she has focal tenderness over the right greater trochanter.  I think this is probably just a part of her overall discomfort.  Probably not the main culprit.  Would like to get an x-ray of the right hip, the exam is reassuring but want to rule out fully right hip osteoarthritis.  I offered her steroid injection into the right greater trochanteric bursa, declines for now.  No signs of glue medius tear.  Follow-up with me in 4 weeks, I  think a trial of steroid injection to the right greater trochanter would be helpful.      Relevant Orders   DG Hip Unilat W OR W/O Pelvis 2-3 Views Right     Return in about 4 weeks (around 10/14/2023) for back pain management.  Cleatus Debby Specking, MD

## 2023-09-16 NOTE — Patient Instructions (Signed)
  VISIT SUMMARY: Today, we discussed your chronic right lower back pain that radiates to your leg and sometimes causes numbness. We reviewed your history and the treatments you have tried so far, including Robaxin , Aleve, and physical therapy.  YOUR PLAN: -CHRONIC LOW BACK PAIN WITH RADICULOPATHY: Chronic low back pain with radiculopathy means that the pain in your lower back is spreading to your leg, likely due to nerve irritation or compression. We will order an MRI of your lumbar spine to check for arthritis or nerve issues and an X-ray of your right hip to see if there is any hip joint problem. Depending on the results, we may consider a steroid injection if the pain is coming from the greater trochanter area. Please avoid chiropractic treatment until we have completed these evaluations.  INSTRUCTIONS: Please schedule an MRI of your lumbar spine in the next few weeks and obtain an X-ray of your right hip as soon as possible at the designated facility. Follow up with us  once you have the results so we can discuss further management options.

## 2023-09-16 NOTE — Assessment & Plan Note (Signed)
 Chronic low back pain radiating to the right leg with foot numbness. Differential includes lumbar spine arthritis, nerve impingement, hip pathology, and greater trochanteric pain syndrome. Pain unresponsive to muscle relaxants or physical therapy. Discussed imaging to guide treatment, including steroid injections or surgery.  - Order MRI of the lumbar spine to assess for arthritis or nerve impingement. - Order X-ray of the right hip to evaluate for hip joint pathology. - Discuss potential for steroid injection at the greater trochanter if imaging supports this as the pain source. - Advise against chiropractic treatment until further evaluation is completed. - Schedule MRI of the lumbar spine in the next few weeks. - Instruct to obtain X-ray of the hip at the designated facility as soon as possible. - Follow up with results to discuss further management options.

## 2023-09-18 ENCOUNTER — Ambulatory Visit: Payer: Self-pay | Admitting: Student in an Organized Health Care Education/Training Program

## 2023-09-18 DIAGNOSIS — G8929 Other chronic pain: Secondary | ICD-10-CM

## 2023-09-25 ENCOUNTER — Ambulatory Visit (HOSPITAL_COMMUNITY)
Admission: RE | Admit: 2023-09-25 | Discharge: 2023-09-25 | Disposition: A | Discharge status: Home / Self Care | Source: Ambulatory Visit | Attending: Student in an Organized Health Care Education/Training Program | Admitting: Student in an Organized Health Care Education/Training Program

## 2023-09-25 DIAGNOSIS — G8929 Other chronic pain: Secondary | ICD-10-CM | POA: Diagnosis present

## 2023-09-25 DIAGNOSIS — M5441 Lumbago with sciatica, right side: Secondary | ICD-10-CM | POA: Diagnosis present

## 2023-09-26 MED ORDER — DULOXETINE HCL 30 MG PO CPEP: 30.0000 mg | ORAL_CAPSULE | Freq: Every day | ORAL | 1 refills | Status: DC

## 2023-09-27 NOTE — Telephone Encounter (Signed)
 Ok. I have cancelled the PT referral.

## 2023-10-28 ENCOUNTER — Ambulatory Visit: Admitting: Student in an Organized Health Care Education/Training Program

## 2023-11-18 ENCOUNTER — Encounter: Payer: Self-pay | Admitting: Student in an Organized Health Care Education/Training Program

## 2023-11-18 ENCOUNTER — Ambulatory Visit: Admitting: Student in an Organized Health Care Education/Training Program

## 2023-11-18 VITALS — BP 148/87 | HR 73 | Wt 151.0 lb

## 2023-11-18 DIAGNOSIS — G4733 Obstructive sleep apnea (adult) (pediatric): Secondary | ICD-10-CM | POA: Insufficient documentation

## 2023-11-18 DIAGNOSIS — R7303 Prediabetes: Secondary | ICD-10-CM

## 2023-11-18 DIAGNOSIS — G8929 Other chronic pain: Secondary | ICD-10-CM

## 2023-11-18 DIAGNOSIS — M5441 Lumbago with sciatica, right side: Secondary | ICD-10-CM | POA: Diagnosis not present

## 2023-11-18 DIAGNOSIS — I10 Essential (primary) hypertension: Secondary | ICD-10-CM | POA: Insufficient documentation

## 2023-11-18 DIAGNOSIS — F39 Unspecified mood [affective] disorder: Secondary | ICD-10-CM | POA: Insufficient documentation

## 2023-11-18 LAB — BASIC METABOLIC PANEL WITH GFR
BUN: 12 mg/dL (ref 6–23)
CO2: 29 meq/L (ref 19–32)
Calcium: 9 mg/dL (ref 8.4–10.5)
Chloride: 104 meq/L (ref 96–112)
Creatinine, Ser: 0.69 mg/dL (ref 0.40–1.20)
GFR: 100.74 mL/min (ref >60.00)
Glucose, Bld: 75 mg/dL (ref 70–99)
Potassium: 3.8 meq/L (ref 3.5–5.1)
Sodium: 138 meq/L (ref 135–145)

## 2023-11-18 LAB — LIPID PANEL
Cholesterol: 152 mg/dL (ref 0–200)
HDL: 62.9 mg/dL (ref >39.00)
LDL Cholesterol: 75 mg/dL (ref 0–99)
NonHDL: 88.61
Total CHOL/HDL Ratio: 2
Triglycerides: 69 mg/dL (ref 0.0–149.0)
VLDL: 13.8 mg/dL (ref 0.0–40.0)

## 2023-11-18 LAB — HEMOGLOBIN A1C: Hgb A1c MFr Bld: 6.3 % (ref 4.6–6.5)

## 2023-11-18 MED ORDER — TRAZODONE HCL 50 MG PO TABS: 50.0000 mg | ORAL_TABLET | Freq: Every day | ORAL | 2 refills | Status: DC

## 2023-11-18 MED ORDER — DULOXETINE HCL 60 MG PO CPEP: 60.0000 mg | ORAL_CAPSULE | Freq: Every day | ORAL | 3 refills | Status: DC

## 2023-11-18 NOTE — Assessment & Plan Note (Signed)
 Pain is managed with medication but worsens with prolonged standing. Increase duloxetine  to 60 mg daily and send a new prescription to the pharmacy.  Patient is not interested in physical therapy or facet joint injections at this time.  Discomfort is not function limiting right now.

## 2023-11-18 NOTE — Assessment & Plan Note (Signed)
 Blood pressure is elevated at 151/86, possibly due to lack of sleep. Recheck blood pressure and order blood work.

## 2023-11-18 NOTE — Assessment & Plan Note (Signed)
 She experiences significant sleep disturbances and daytime fatigue due to mild obstructive sleep apnea and insomnia. Although CPAP is recommended, she declines to use it. Encouraged reconsideration of CPAP for sleep apnea management.  I do not suspect there is a ENT based surgery that would resolve her snoring issue.

## 2023-11-18 NOTE — Patient Instructions (Signed)
  VISIT SUMMARY: Today, you came in for a follow-up visit to discuss your chronic back pain and sleep disturbances. We reviewed your current symptoms and made some adjustments to your treatment plan to help improve your overall well-being.  YOUR PLAN: -CHRONIC RIGHT-SIDED LOW BACK PAIN WITH RIGHT-SIDED SCIATICA: This condition involves persistent pain in your lower back that can radiate down your leg. We will increase your duloxetine  dosage to 60 mg daily to help manage the pain more effectively. A new prescription has been sent to your pharmacy.  -OBSTRUCTIVE SLEEP APNEA AND INSOMNIA: Obstructive sleep apnea is a condition where your breathing stops and starts during sleep, leading to poor sleep quality and daytime fatigue. Insomnia is difficulty falling or staying asleep. We will start you on trazodone  to be taken at night, about an hour before sleep. Please reconsider using CPAP therapy for better management of sleep apnea, and we may refer you back to an ENT specialist for further evaluation.  -ALLERGIC RHINITIS: Allergic rhinitis is inflammation of the nasal passages caused by allergies, leading to symptoms like a runny nose and congestion. Continue using Zyrtec  and Flonase  as prescribed, but we may need to explore additional treatments if symptoms persist.  -ESSENTIAL HYPERTENSION: Essential hypertension is high blood pressure without a known secondary cause. Your blood pressure was elevated today, which may be related to your lack of sleep. We will recheck your blood pressure and order blood work to monitor your condition.  -DEPRESSIVE DISORDER: Depressive disorder involves persistent feelings of sadness and loss of interest. Your symptoms may be worsened by sleep disturbances and stress. We will increase your duloxetine  dosage to 60 mg daily to help manage these symptoms.  INSTRUCTIONS: Please follow up with us  after starting the new medication dosages and trazodone . We will recheck your blood  pressure and review your blood work results at your next visit. Consider scheduling an appointment with an ENT specialist for further evaluation of your sinus issues and sleep apnea.

## 2023-11-18 NOTE — Progress Notes (Signed)
 Established Patient Office Visit  Subjective   Patient ID: Paula [name], female    DOB: 08/26/1970  Age: 53 y.o. [name]: [id number]  Chief Complaint  Patient presents with   Back Pain    Follow up for back pain from 07/07    HPI  Discussed the use of AI scribe software for clinical note transcription with the patient, who gave verbal consent to proceed.  History of Present Illness Paula [name] is a 53 year old female who presents for follow-up of chronic back pain and sleep disturbances.  She experiences chronic back pain primarily on the right side of her lower back. The pain improves with daily duloxetine  but worsens with prolonged standing, necessitating rest. Since her last visit in July, her condition has improved with adherence to her medication regimen.  She has significant sleep disturbances attributed to sinus issues and allergies, particularly in spring. She experiences difficulty breathing, disrupting her sleep, and uses Zyrtec  and Flonase  twice daily with limited effectiveness. Her sleep is non-restorative, and she has been diagnosed with mild obstructive sleep apnea. She has tried a mouth guard without success and has not pursued CPAP therapy. She reports snoring and waking up gasping for air, and has seen an ENT specialist multiple times for sinus issues.  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 She works five days a week from 5:00 AM to 1:30 PM and often feels tired and stressed due to lack of sleep.     Objective:     BP (!) 148/87   Pulse 73   Wt 151 lb (68.5 kg)   LMP 11/26/2016   BMI 27.62 kg/m    Physical Exam  Gen: Well-appearing woman Mouth: Unable to visualize posterior oropharynx due to large tongue Neck: No thyromegaly or adenopathy or nodules Heart: Regular, no murmur Lungs: Unlabored, clear throughout Ext: Warm, no edema, normal joints    Assessment & Plan:    Problem List Items  Addressed This Visit       Unprioritized   Chronic right-sided low back pain with right-sided sciatica - Primary   Pain is managed with medication but worsens with prolonged standing. Increase duloxetine  to 60 mg daily and send a new prescription to the pharmacy.  Patient is not interested in physical therapy or facet joint injections at this time.  Discomfort is not function limiting right now.      Relevant Medications   traZODone  (DESYREL ) 50 MG tablet   DULoxetine  (CYMBALTA ) 60 MG capsule   Hypertension   Blood pressure is elevated at 151/86, possibly due to lack of sleep. Recheck blood pressure and order blood work.      Relevant Orders   Basic metabolic panel with GFR   Lipid panel   OSA (obstructive sleep apnea)   She experiences significant sleep disturbances and daytime fatigue due to mild obstructive sleep apnea and insomnia. Although CPAP is recommended, she declines to use it. Encouraged reconsideration of CPAP for sleep apnea management.  I do not suspect there is a ENT based surgery that would resolve her snoring issue.      Mood disorder (HCC)   Depressive symptoms are likely exacerbated by sleep disturbances and stress. Increase duloxetine  to 60 mg daily and start trazodone  50 mg nightly.  Follow-up with either myself or Dr. Levora in 2-3 months.      Relevant Medications   traZODone  (DESYREL ) 50 MG tablet   DULoxetine  (CYMBALTA ) 60 MG capsule  Pre-diabetes   Relevant Orders   Hemoglobin A1c    Return in about 2 weeks (around 12/02/2023).    Cleatus Debby Specking, MD

## 2023-11-18 NOTE — Assessment & Plan Note (Signed)
 Depressive symptoms are likely exacerbated by sleep disturbances and stress. Increase duloxetine  to 60 mg daily and start trazodone  50 mg nightly.  Follow-up with either myself or Dr. Levora in 2-3 months.

## 2023-11-19 ENCOUNTER — Ambulatory Visit: Payer: Self-pay | Admitting: Student in an Organized Health Care Education/Training Program

## 2023-11-25 NOTE — Telephone Encounter (Signed)
 Copied from CRM [id number]. Topic: Clinical - Lab/Test Results >> Nov 25, 2023  3:08 PM Taleah C wrote: Reason for CRM: pt called back and I relayed lab results. Pt verbalized understanding and had no f/u questions.

## 2023-12-02 ENCOUNTER — Encounter: Payer: Self-pay | Admitting: Family Medicine

## 2023-12-02 ENCOUNTER — Ambulatory Visit: Admitting: Family Medicine

## 2023-12-02 VITALS — BP 126/78 | HR 72 | Resp 16 | Ht 62.0 in | Wt 149.2 lb

## 2023-12-02 DIAGNOSIS — R7303 Prediabetes: Secondary | ICD-10-CM | POA: Diagnosis not present

## 2023-12-02 DIAGNOSIS — G4733 Obstructive sleep apnea (adult) (pediatric): Secondary | ICD-10-CM

## 2023-12-02 DIAGNOSIS — M542 Cervicalgia: Secondary | ICD-10-CM

## 2023-12-02 DIAGNOSIS — Z23 Encounter for immunization: Secondary | ICD-10-CM

## 2023-12-02 DIAGNOSIS — M5441 Lumbago with sciatica, right side: Secondary | ICD-10-CM | POA: Diagnosis not present

## 2023-12-02 DIAGNOSIS — G8929 Other chronic pain: Secondary | ICD-10-CM

## 2023-12-02 MED ORDER — TIZANIDINE HCL 4 MG PO TABS: 4.0000 mg | ORAL_TABLET | Freq: Every evening | ORAL | 0 refills | Status: DC | PRN

## 2023-12-02 NOTE — Progress Notes (Signed)
 Subjective:  Patient ID: Paula [name], female    DOB: 17-Jun-1970  Age: 53 y.o. [name]: [id number]  CC:  Chief Complaint  Patient presents with   Follow-up    2 week f/u    HPI Paula [name] presents for   Right sided low back pain MRI in July L5-S1 right-sided severe facet arthropathy.  Per prior notes, some benefit to PT in June after 6 sessions, new referral was placed by Dr. Jerrell. Chronic low back pain most recently treated by my colleague on September 8.  Cymbalta  was increased to 60 mg daily, declined further therapy at that time.  per that note, not interested in physical therapy or facet joint injections as discomfort was not functional limiting at this time.  Higher dose cymbalta  past 2 weeks, no side effects. Back pain is the same.  No bowel or bladder incontinence, no saddle anesthesia, no lower extremity weakness. No fever, no night sweats.    Neck pain past year. No injury. Bilateral neck.  Eval by Dr. Gillie in past - MRI c spine 11/29/22 - IMPRESSION: 1. No acute intracranial process. 2. C6-C7 mild bilateral and C7-T1 mild right neural foraminal narrowing. 3. No spinal canal stenosis.  OV with MS in 06/2022 - plan for PT - she did not have.   Mood disorder Also discussed with Dr. Jerrell.  Sleep disturbance, stress likely contributing to depressive symptoms.  Cymbalta  was increased as above, started trazodone  50 mg nightly.  She does have history of OSA, mild OSA with insomnia.  Has declined use of CPAP. No change in sleep with trazodone  - thinks d/t neck pain. Attributes soreness in neck to head movement with snoring. Not using CPAP.   Prediabetes: Recent labs obtained with Dr. Jerrell.  Weight improved by 2 pounds since that visit.  Had been losing weight previously from 156 in July to 151 at her September 8 visit. Lab Results  Component Value Date   HGBA1C 6.3 11/18/2023   Wt Readings from Last 3 Encounters:  12/02/23 149 lb 3.2 oz (67.7 kg)  11/18/23 151  lb (68.5 kg)  09/16/23 156 lb (70.8 kg)   Blood pressure elevation Last visit.  Improved today.  BP Readings from Last 3 Encounters:  12/02/23 126/78  11/18/23 (!) 148/87  09/16/23 130/79   Lab Results  Component Value Date   CREATININE 0.69 11/18/2023    HM: Advised to contact previous imaging facility for repeat mammogram.  Will schedule physical in the next few months.  Flu vaccine today.  Other health maintenance to be discussed at her physical.  History Patient Active Problem List   Diagnosis Date Noted   Hypertension 11/18/2023   OSA (obstructive sleep apnea) 11/18/2023   Mood disorder 11/18/2023   Chronic right-sided low back pain with right-sided sciatica 09/16/2023   Greater trochanteric pain syndrome of right lower extremity 09/16/2023   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   Pre-diabetes 03/02/2020   Vitamin D  deficiency 03/02/2020   Cervicalgia 12/23/2019   Fibroids 11/26/2016   Amenorrhea 08/05/2012   Past Medical History:  Diagnosis Date   Allergy    Anemia    Past Surgical History:  Procedure Laterality Date   ABDOMINAL HYSTERECTOMY  11/26/2016   Procedure: HYSTERECTOMY ABDOMINAL;  Surgeon: Latisha Medford, MD;  Location: [facility];  Service: Gynecology;;   COLONOSCOPY WITH PROPOFOL  N/A 09/22/2020   Procedure: COLONOSCOPY WITH PROPOFOL ;  Surgeon: Teressa Toribio SQUIBB, MD;  Location: [facility];  Service: Endoscopy;  Laterality: N/A;   NO PAST  SURGERIES     POLYPECTOMY  09/22/2020   Procedure: POLYPECTOMY;  Surgeon: Teressa Toribio SQUIBB, MD;  Location: [facility];  Service: Endoscopy;;   No Known Allergies Prior to Admission medications   Medication Sig Start Date End Date Taking? Authorizing Provider  DULoxetine  (CYMBALTA ) 60 MG capsule Take 1 capsule (60 mg total) by mouth daily. 11/18/23  Yes Jerrell Cleatus Ned, MD  fluticasone  (FLONASE ) 50 MCG/ACT nasal spray Place 2 sprays into both nostrils daily. 08/09/22  Yes Tabori, Katherine E, MD  traZODone  (DESYREL )  50 MG tablet Take 1 tablet (50 mg total) by mouth at bedtime. 11/18/23  Yes Jerrell Cleatus Ned, MD   Social History   Socioeconomic History   Marital status: Single    Spouse name: Not on file   Number of children: 3   Years of education: 12   Highest education level: Not on file  Occupational History   Occupation: [employer]    Employer: [employer]  Tobacco Use   Smoking status: Never   Smokeless tobacco: Never  Vaping Use   Vaping status: Never Used  Substance and Sexual Activity   Alcohol use: No   Drug use: No   Sexual activity: Not Currently  Other Topics Concern   Not on file  Social History Narrative   Right handed   Social Drivers of Health   Financial Resource Strain: Not on file  Food Insecurity: Not on file  Transportation Needs: Not on file  Physical Activity: Not on file  Stress: Not on file  Social Connections: Not on file  Intimate Partner Violence: Not on file    Review of Systems   Objective:   Vitals:   12/02/23 1410  BP: 126/78  Pulse: 72  Resp: 16  SpO2: 98%  Weight: 149 lb 3.2 oz (67.7 kg)  Height: 5' 2 (1.575 m)     Physical Exam Vitals reviewed.  Constitutional:      Appearance: Normal appearance. She is well-developed.  HENT:     Head: Normocephalic and atraumatic.  Eyes:     Conjunctiva/sclera: Conjunctivae normal.     Pupils: Pupils are equal, round, and reactive to light.  Neck:     Vascular: No carotid bruit.  Cardiovascular:     Rate and Rhythm: Normal rate and regular rhythm.     Heart sounds: Normal heart sounds.  Pulmonary:     Effort: Pulmonary effort is normal.     Breath sounds: Normal breath sounds.  Abdominal:     Palpations: Abdomen is soft. There is no pulsatile mass.     Tenderness: There is no abdominal tenderness.  Musculoskeletal:     Right lower leg: No edema.     Left lower leg: No edema.     Comments: C-spine, no midline bony tenderness, describes area discomfort in the paraspinals of the posterior neck.   Somewhat limited in extension, rotation bilaterally.  LS spine, no midline bony tenderness.  Describes area of discomfort into the right paraspinals towards the SI joint.  Skin:    General: Skin is warm and dry.  Neurological:     Mental Status: She is alert and oriented to person, place, and time.  Psychiatric:        Mood and Affect: Mood normal.        Behavior: Behavior normal.        Assessment & Plan:  Paula [name] is a 53 y.o. female . Chronic right-sided low back pain with right-sided sciatica - Plan: tiZANidine  (ZANAFLEX )  4 MG tablet Chronic neck pain - Plan: tiZANidine  (ZANAFLEX ) 4 MG tablet  - Chronic back and neck pain, previously treated by neurosurgery.  Minimal change in symptoms with higher dose of Cymbalta  but is tolerating that medication.  Advised that she follow back up with her neurosurgeon as physical therapy will likely be recommended but can discuss alternatives at that visit.  Can also discuss the neck pain which appears to be a chronic condition as well without recent injury.  Previous MRI noted.  Since trazodone  has been ineffective, advised to stop that medication.  Short-term tizanidine  prescribed if needed at night for neck pain, spasms.  Discussed pain management provider if persistent need for that medication.  Recheck 3 months.  OSA (obstructive sleep apnea)  - Importance of CPAP use discussed.  If she does have concerns with her CPAP recommended follow-up with her sleep specialist, phone number provided.  Pre-diabetes  - Weight has improved, handout given on prediabetes, can recheck in 3 months at a physical.  Of note her blood pressure looks better today.  Will continue to monitor.  Needs flu shot - Plan: Flu vaccine    Meds ordered this encounter  Medications   tiZANidine  (ZANAFLEX ) 4 MG tablet    Sig: Take 1 tablet (4 mg total) by mouth at bedtime as needed for muscle spasms.    Dispense:  30 tablet    Refill:  0   Patient Instructions   For back pain, ok to stay on higher dose cymbalta . Please meet with your neurosurgeon to discuss other options. Call Dr. Genette office for an appointment. Can discuss neck pain then as well, but I suspect they may recommend physical therapy again.  Kyle L. Gillie, MD Neurosurgeon in [place], [place]  Address: [address], [place], [place] [address] Phone: [phone]  Stop trazodone  if not effective for sleep. I will write for a few muscle relaxants short term. If long term need, will need to be treated by pain management specialist.   I do recommend use of CPAP for sleep apnea. If unable to tolerate that device, discuss options with your sleep specialist.   True Mar, MD Address: [address], [place], [place] [address] Phone: [phone]  Physical in 3 months.  You can call the previous imaging facility where you had your mammogram to schedule updated mammogram.  Flu vaccine given today, we can discuss other health maintenance at your upcoming physical.  If any acute concerns sooner, see me sooner. Thanks for coming in today.      Signed,   Reyes Pines, MD [facility], [facility] 12/02/23 2:57 PM

## 2023-12-02 NOTE — Patient Instructions (Addendum)
 For back pain, ok to stay on higher dose cymbalta . Please meet with your neurosurgeon to discuss other options. Call Dr. Genette office for an appointment. Can discuss neck pain then as well, but I suspect they may recommend physical therapy again.  Kyle L. Gillie, MD Neurosurgeon in [place], [place]  Address: [address], [place], [place] [address] Phone: [phone]  Stop trazodone  if not effective for sleep. I will write for a few muscle relaxants short term. If long term need, will need to be treated by pain management specialist.   I do recommend use of CPAP for sleep apnea. If unable to tolerate that device, discuss options with your sleep specialist.   True Mar, MD Address: [address], [place], [place] [address] Phone: [phone]  69-month blood sugar test at your last visit was at prediabetes level.  See information below.  Continue to watch diet, walking or other low intensity exercise to manage weight.  We can recheck labs next visit.  Physical in 3 months.  You can call the previous imaging facility where you had your mammogram to schedule updated mammogram.  Flu vaccine given today, we can discuss other health maintenance at your upcoming physical.  If any acute concerns sooner, see me sooner. Thanks for coming in today.    Prediabetes: Eating Plan Prediabetes is when your levels of blood sugar, also called glucose, are higher than normal. This can put you at risk for getting type 2 diabetes. When you have prediabetes, making healthy changes can help keep you from getting diabetes. This includes changes in your diet. Work with your health care provider or an expert in healthy eating called a dietitian. They can help you create a healthy eating plan. This plan can help you: Control your blood sugar levels. Improve your cholesterol levels. Manage your blood pressure. What are tips for following this plan? Reading food labels Read food labels to  check the amount of fat and sugar in prepackaged foods. Avoid foods that have: Saturated fats. Trans fats. Added sugars. Check food labels for the amount of salt (sodium). Avoid foods that have more than 300 milligrams (mg) of salt per serving. Limit your salt intake to less than 2,300 mg each day. Shopping Avoid buying pre-made and processed foods. Avoid buying drinks with added sugar. Cooking Cook with olive oil. Do not use: Butter. Lard. Ghee. Bake, broil, grill, steam, or boil foods. Avoid frying. Meal planning  Work with your dietitian to create an eating plan that's right for you. This may include tracking how many calories you take in each day. Use a food diary, notebook, or mobile app to track what you eat at each meal. Consider following a Mediterranean diet. This includes: Eating many servings of fresh fruits and vegetables each day. Eating fish at least twice a week. Eating one serving each day of whole grains, beans, nuts, and seeds. Using olive oil instead of other fats. Limiting alcohol. Limiting red meat. Using nonfat or low-fat dairy products. Consider following a plant-based diet. This means eating mostly: Vegetables and fruit. Grains. Beans. Nuts and seeds. If you have high blood pressure, you may need to limit your salt intake or follow a diet called the DASH eating plan. The DASH eating plan can help lower high blood pressure. Lifestyle Set weight loss goals with help from your health care team. Losing 7% of your body weight is a good goal for most people with prediabetes. Exercise for at least 30 minutes,  5 or more days a week. For support, think about joining a support group or talking with a mental health counselor. Take medicines only as told. What foods are recommended? Fruits Berries. Bananas. Apples. Oranges. Grapes. Papaya. Mango. Pomegranate. Kiwi. Grapefruit. Cherries. Vegetables Lettuce. Spinach. Peas. Beets. Cauliflower. Cabbage. Broccoli.  Carrots. Tomatoes. Squash. Eggplant. Herbs. Peppers. Onions. Cucumbers. Brussels sprouts. Grains Whole grains, such as whole-wheat or whole-grain breads or pasta. Unsweetened oatmeal. Bulgur. Barley. Quinoa. Brown rice. Corn or whole-wheat flour tortillas or taco shells. Meats and other proteins Seafood. Poultry without skin. Lean cuts of pork and beef. Tofu. Eggs. Nuts. Beans. Dairy Low-fat or fat-free dairy products, such as yogurt, cottage cheese, and cheese. Beverages Water. Tea. Coffee. Sugar-free or diet soda. Seltzer water. Low-fat or nonfat milk. Milk alternatives, such as soy or almond milk. Fats and oils Olive oil. Canola oil. Sunflower oil. Grapeseed oil. Avocado. Walnuts. Sweets and desserts Sugar-free or low-fat pudding. Sugar-free or low-fat ice cream and other frozen treats. Seasonings and condiments Herbs. Salt-free spices. Mustard. Relish. Low-salt, low-sugar ketchup. Low-salt, low-sugar barbecue sauce. Low-fat or fat-free mayonnaise. The items listed above may not be all the foods and drinks you can have. Talk with a dietitian to learn more. What foods are not recommended? Fruits Fruits canned with syrup. Vegetables Canned vegetables. Frozen vegetables with butter or cream sauce. Grains Refined white flour and flour products, such as bread, pasta, snack foods, and cereals. Meats and other proteins Fatty cuts of meat. Poultry with skin. Breaded or fried meat. Processed meats. Dairy Full-fat yogurt, cheese, or milk. Beverages Sweetened drinks, such as iced tea and soda. Fats and oils Butter. Lard. Ghee. Sweets and desserts Baked goods, such as cake, cupcakes, pastries, cookies, and cheesecake. Seasonings and condiments Spice mixes with added salt. Ketchup. Barbecue sauce. Mayonnaise. The items listed above may not be all the foods and drinks you should avoid. Talk with a dietitian to learn more. Where to find more information American Diabetes Association:  diabetes.org/food-nutrition This information is not intended to replace advice given to you by your health care provider. Make sure you discuss any questions you have with your health care provider. Document Revised: 09/30/2022 Document Reviewed: 09/30/2022 Elsevier Patient Education  2024 Elsevier Inc.  Chronic Back Pain Chronic back pain is back pain that lasts longer than 3 months. The cause of your back pain may not be known. Some common causes include: Wear and tear (degenerative disease) of the bones, disks, or tissues that connect bones to each other (ligaments) in your back. Inflammation and stiffness in your back (arthritis). If you have chronic back pain, you may have times when the pain is more intense (flare-ups). You can also learn to manage the pain with home care. Follow these instructions at home: Watch for any changes in your symptoms. Take these actions to help with your pain: Managing pain and stiffness     If told, put ice on the painful area. You may be told to apply ice for the first 24-48 hours after a flare-up starts. Put ice in a plastic bag. Place a towel between your skin and the bag. Leave the ice on for 20 minutes, 2-3 times per day. If told, apply heat to the affected area as often as told by your health care provider. Use the heat source that your provider recommends, such as a moist heat pack or a heating pad. Place a towel between your skin and the heat source. Leave the heat on for 20-30 minutes. If your  skin turns bright red, remove the ice or heat right away to prevent skin damage. The risk of damage is higher if you cannot feel pain, heat, or cold. Try soaking in a warm tub. Activity        Avoid bending and other activities that make the pain worse. Have good posture when you stand or sit. When you stand, keep your upper back and neck straight, with your shoulders pulled back. Avoid slouching. When you sit, keep your back straight. Relax your  shoulders. Do not round your shoulders or pull them backward. Do not sit or stand in one place for too long. Take brief periods of rest during the day. This will reduce your pain. Resting in a lying or standing position is often better than sitting to rest. When you rest for longer periods, mix in some mild activity or stretching between periods of rest. This will help to prevent stiffness and pain. Get regular exercise. Ask your provider what activities are safe for you. You may have to avoid lifting. Ask your provider how much you can safely lift. If you do lift, always use the right technique. This means you should: Bend your knees. Keep the load close to your body. Avoid twisting. Medicines Take over-the-counter and prescription medicines only as told by your provider. You may need to take medicines for pain and inflammation. These may be taken by mouth or put on the skin. You may also be given muscle relaxants. Ask your provider if the medicine prescribed to you: Requires you to avoid driving or using machinery. Can cause constipation. You may need to take these actions to prevent or treat constipation: Drink enough fluid to keep your pee (urine) pale yellow. Take over-the-counter or prescription medicines. Eat foods that are high in fiber, such as beans, whole grains, and fresh fruits and vegetables. Limit foods that are high in fat and processed sugars, such as fried or sweet foods. General instructions  Sleep on a firm mattress in a comfortable position. Try lying on your side with your knees slightly bent. If you lie on your back, put a pillow under your knees. Do not use any products that contain nicotine or tobacco. These products include cigarettes, chewing tobacco, and vaping devices, such as e-cigarettes. If you need help quitting, ask your provider. Contact a health care provider if: You have pain that does not get better with rest or medicine. You have new pain. You have a  fever. You lose weight quickly. You have trouble doing your normal activities. You feel weak or numb in one or both of your legs or feet. Get help right away if: You are not able to control when you pee or poop. You have severe back pain and: Nausea or vomiting. Pain in your chest or abdomen. Shortness of breath. You faint. These symptoms may be an emergency. Get help right away. Call 911. Do not wait to see if the symptoms will go away. Do not drive yourself to the hospital. This information is not intended to replace advice given to you by your health care provider. Make sure you discuss any questions you have with your health care provider. Document Revised: 10/16/2021 Document Reviewed: 10/16/2021 Elsevier Patient Education  2024 [employer].

## 2024-03-04 ENCOUNTER — Encounter: Admitting: Family Medicine

## 2024-04-02 ENCOUNTER — Encounter: Payer: Self-pay | Admitting: Student in an Organized Health Care Education/Training Program

## 2024-04-02 ENCOUNTER — Ambulatory Visit: Admitting: Student in an Organized Health Care Education/Training Program

## 2024-04-02 VITALS — BP 137/76 | HR 91 | Temp 98.2°F | Ht 62.0 in | Wt 155.0 lb

## 2024-04-02 DIAGNOSIS — M542 Cervicalgia: Secondary | ICD-10-CM | POA: Diagnosis not present

## 2024-04-02 DIAGNOSIS — F39 Unspecified mood [affective] disorder: Secondary | ICD-10-CM | POA: Diagnosis not present

## 2024-04-02 DIAGNOSIS — M5441 Lumbago with sciatica, right side: Secondary | ICD-10-CM

## 2024-04-02 DIAGNOSIS — J309 Allergic rhinitis, unspecified: Secondary | ICD-10-CM | POA: Insufficient documentation

## 2024-04-02 DIAGNOSIS — G4733 Obstructive sleep apnea (adult) (pediatric): Secondary | ICD-10-CM

## 2024-04-02 DIAGNOSIS — G8929 Other chronic pain: Secondary | ICD-10-CM | POA: Diagnosis not present

## 2024-04-02 MED ORDER — FLUTICASONE PROPIONATE 50 MCG/ACT NA SUSP: 2.0000 | Freq: Every day | NASAL | 6 refills | Status: AC

## 2024-04-02 MED ORDER — TIZANIDINE HCL 4 MG PO TABS: 4.0000 mg | ORAL_TABLET | Freq: Every evening | ORAL | 0 refills | Status: AC | PRN

## 2024-04-02 MED ORDER — DULOXETINE HCL 60 MG PO CPEP: 60.0000 mg | ORAL_CAPSULE | Freq: Every day | ORAL | 3 refills | Status: AC

## 2024-04-02 NOTE — Assessment & Plan Note (Signed)
 Mild obstructive sleep apnea diagnosed in 2023 with 11 apneic events per hour.  I suspect her symptoms are worsening because of recent weight gain and also allergic rhinitis.  Previous CPAP therapy recommendation not followed, resulting in poor sleep quality and snoring. Seasonal allergies may contribute. Ordered AutoPAP therapy through [facility] and coordinated with her for insurance and delivery. Educated on AutoPAP use, mask fitting, and adjustment.

## 2024-04-02 NOTE — Assessment & Plan Note (Signed)
 Chronic allergic rhinitis with seasonal exacerbations. Congestion and difficulty breathing during pollen season reported. Refilled allergy medications.

## 2024-04-02 NOTE — Progress Notes (Signed)
 "  Acute Office Visit  Patient ID: Paula [name], female    DOB: 05-13-1970, 54 y.o.   [name]: [id number]  PCP: Levora Reyes SAUNDERS, MD  Chief Complaint  Patient presents with   Medical Management of Chronic Issues    Patient states she wants to try the cpap now.     Subjective:     HPI  Discussed the use of AI scribe software for clinical note transcription with the patient, who gave verbal consent to proceed.  History of Present Illness Paula [name] is a 54 year old female with mild obstructive sleep apnea who presents with persistent sleep disturbances and snoring.  She experiences ongoing sleep disturbances characterized by excessive snoring and poor sleep quality. Her symptoms are exacerbated by post-nasal drip and congestion, particularly during pollen season. She had a sleep study in December 2023 that showed she was having about eleven apneic events per hour, and she has not yet tried CPAP therapy. She previously used a dental appliance, which was ineffective.  She associates her back pain with poor sleep quality, noting that adequate sleep alleviates her discomfort. Additionally, she experiences neck tightness.  Her work schedule has changed, now requiring her to work from 4 PM to 1 AM, which she finds disruptive to her sleep and overall well-being.      Objective:    BP 137/76   Pulse 91   Temp 98.2 F (36.8 C) (Oral)   Ht 5' 2 (1.575 m)   Wt 155 lb (70.3 kg)   LMP 11/05/2016   SpO2 99%   BMI 28.35 kg/m   Physical Exam  Gen: Well-appearing woman Ears: Clear, normal tympanic membranes Mouth: Crowded posterior oropharynx, cannot see the uvula Neck: Normal thyroid , no nodules or adenopathy Heart: Regular, no murmur Lungs: Unlabored, clear throughout Ext: Warm, no edema, normal joints     Assessment & Plan:   Problem List Items Addressed This Visit       Unprioritized   Chronic right-sided low back pain with right-sided sciatica   Chronic and stable.   Doing pretty well with medication management.  Functional.  Doing okay at work.  I refilled tizanidine  to use as needed, and I refilled Cymbalta  to be used on a daily basis.      Relevant Medications   tiZANidine  (ZANAFLEX ) 4 MG tablet   DULoxetine  (CYMBALTA ) 60 MG capsule   OSA (obstructive sleep apnea) - Primary   Mild obstructive sleep apnea diagnosed in 2023 with 11 apneic events per hour.  I suspect her symptoms are worsening because of recent weight gain and also allergic rhinitis.  Previous CPAP therapy recommendation not followed, resulting in poor sleep quality and snoring. Seasonal allergies may contribute. Ordered AutoPAP therapy through [facility] and coordinated with her for insurance and delivery. Educated on AutoPAP use, mask fitting, and adjustment.      Allergic rhinitis   Chronic allergic rhinitis with seasonal exacerbations. Congestion and difficulty breathing during pollen season reported. Refilled allergy medications.      Relevant Medications   fluticasone  (FLONASE ) 50 MCG/ACT nasal spray   Mood disorder   Relevant Medications   DULoxetine  (CYMBALTA ) 60 MG capsule   Other Visit Diagnoses       Chronic neck pain       Relevant Medications   tiZANidine  (ZANAFLEX ) 4 MG tablet   DULoxetine  (CYMBALTA ) 60 MG capsule       Meds ordered this encounter  Medications   tiZANidine  (ZANAFLEX ) 4 MG tablet  Sig: Take 1 tablet (4 mg total) by mouth at bedtime as needed for muscle spasms.    Dispense:  30 tablet    Refill:  0   fluticasone  (FLONASE ) 50 MCG/ACT nasal spray    Sig: Place 2 sprays into both nostrils daily.    Dispense:  16 g    Refill:  6   DULoxetine  (CYMBALTA ) 60 MG capsule    Sig: Take 1 capsule (60 mg total) by mouth daily.    Dispense:  90 capsule    Refill:  3    Return in about 3 months (around 07/01/2024).  Cleatus Debby Specking, MD [facility]   "

## 2024-04-02 NOTE — Assessment & Plan Note (Signed)
 Chronic and stable.  Doing pretty well with medication management.  Functional.  Doing okay at work.  I refilled tizanidine  to use as needed, and I refilled Cymbalta  to be used on a daily basis.

## 2024-04-02 NOTE — Patient Instructions (Signed)
" °  VISIT SUMMARY: During your visit, we discussed your ongoing sleep disturbances, snoring, and associated symptoms. We also addressed your back and neck pain, as well as your seasonal allergies.  YOUR PLAN: -OBSTRUCTIVE SLEEP APNEA: Obstructive sleep apnea is a condition where your breathing stops and starts repeatedly during sleep. You have mild obstructive sleep apnea with 11 apneic events per hour. We have ordered CPAP therapy for you through [facility] and coordinated with your insurance for delivery. You were educated on how to use the CPAP machine, including mask fitting and adjustment.  -CHRONIC RIGHT-SIDED LOW BACK PAIN WITH RIGHT-SIDED SCIATICA: Chronic right-sided low back pain with sciatica is a condition where you experience persistent pain in your lower back that radiates down your right leg. Your pain improves with better sleep quality.  -CHRONIC NECK PAIN: Chronic neck pain is a condition where you experience persistent tightness and discomfort in your neck. We have initiated physical therapy to help alleviate your neck pain.  -ALLERGIC RHINITIS: Allergic rhinitis is an allergic reaction that causes sneezing, congestion, and a runny nose, often during pollen season. We have refilled your allergy medications to help manage your symptoms.  INSTRUCTIONS: Please follow up with us  after you have started using the CPAP therapy to discuss your progress. Continue with physical therapy for your neck pain and take your allergy medications as prescribed. If you have any questions or concerns, do not hesitate to contact our office.    Contains text generated by [employer].   "

## 2024-04-10 ENCOUNTER — Other Ambulatory Visit: Payer: Self-pay

## 2024-04-10 ENCOUNTER — Emergency Department (HOSPITAL_COMMUNITY)
Admission: EM | Admit: 2024-04-10 | Discharge: 2024-04-10 | Disposition: A | Discharge status: Home / Self Care | Attending: Emergency Medicine | Admitting: Emergency Medicine

## 2024-04-10 ENCOUNTER — Encounter (HOSPITAL_COMMUNITY): Payer: Self-pay | Admitting: Emergency Medicine

## 2024-04-10 DIAGNOSIS — R519 Headache, unspecified: Secondary | ICD-10-CM | POA: Insufficient documentation

## 2024-04-10 DIAGNOSIS — J029 Acute pharyngitis, unspecified: Secondary | ICD-10-CM | POA: Insufficient documentation

## 2024-04-10 LAB — RESP PANEL BY RT-PCR (RSV, FLU A&B, COVID)  RVPGX2
Influenza A by PCR: NEGATIVE
Influenza B by PCR: NEGATIVE
Resp Syncytial Virus by PCR: NEGATIVE
SARS Coronavirus 2 by RT PCR: NEGATIVE

## 2024-04-10 LAB — GROUP A STREP BY PCR: Group A Strep by PCR: NOT DETECTED

## 2024-04-10 MED ORDER — IBUPROFEN 200 MG PO TABS
600.0000 mg | ORAL_TABLET | Freq: Once | ORAL | Status: AC
  Administered 2024-04-10: 600 mg via ORAL
  Filled 2024-04-10: qty 3

## 2024-04-10 NOTE — Discharge Instructions (Signed)
 Evaluation of your sore throat was overall reassuring.  Please follow with your PCP.  If you have trouble breathing, start to drool or trouble swallowing or any other concerning symptom please return to ED for further evaluation.  Recommend Tylenol  and ibuprofen  at home for symptomatic relief.

## 2024-04-10 NOTE — ED Provider Notes (Signed)
 " [facility]ERGENCY DEPARTMENT AT [facility] Provider Note   CSN: [id number] Arrival date & time: 04/10/24  9348     Patient presents with: Sore Throat  HPI Paula [name] is a 54 y.o. female presenting for sore throat and headache.  She states her throat has been scratchy.  Denies any throat closing sensation, trouble breathing or drooling.  Denies fever.  She states headache has been mild and reports that she does not have a headache at this time.  Denies visual disturbance and nuchal rigidity    Sore Throat       Prior to Admission medications  Medication Sig Start Date End Date Taking? Authorizing Provider  DULoxetine  (CYMBALTA ) 60 MG capsule Take 1 capsule (60 mg total) by mouth daily. 04/02/24   Jerrell Cleatus Ned, MD  fluticasone  (FLONASE ) 50 MCG/ACT nasal spray Place 2 sprays into both nostrils daily. 04/02/24   Jerrell Cleatus Ned, MD  tiZANidine  (ZANAFLEX ) 4 MG tablet Take 1 tablet (4 mg total) by mouth at bedtime as needed for muscle spasms. 04/02/24   Jerrell Cleatus Ned, MD    Allergies: Patient has no known allergies.    Review of Systems See HPI  Updated Vital Signs BP (!) 158/83   Pulse 70   Temp 97.9 F (36.6 C)   Resp 18   LMP 11/05/2016   SpO2 100%   Physical Exam Vitals and nursing note reviewed.  HENT:     Head: Normocephalic and atraumatic.     Mouth/Throat:     Mouth: Mucous membranes are moist.     Tongue: No lesions. Tongue does not deviate from midline.     Pharynx: Oropharynx is clear. Uvula midline.     Tonsils: No tonsillar exudate or tonsillar abscesses.  Eyes:     General:        Right eye: No discharge.        Left eye: No discharge.     Conjunctiva/sclera: Conjunctivae normal.  Neck:     Thyroid : No thyroid  mass, thyromegaly or thyroid  tenderness.     Vascular: No carotid bruit.  Cardiovascular:     Rate and Rhythm: Normal rate and regular rhythm.     Pulses: Normal pulses.     Heart sounds: Normal heart  sounds.  Pulmonary:     Effort: Pulmonary effort is normal.     Breath sounds: Normal breath sounds.  Abdominal:     General: Abdomen is flat.     Palpations: Abdomen is soft.  Musculoskeletal:     Cervical back: Full passive range of motion without pain, normal range of motion and neck supple. No edema or erythema. No pain with movement, spinous process tenderness or muscular tenderness. Normal range of motion.  Lymphadenopathy:     Cervical: No cervical adenopathy.  Skin:    General: Skin is warm and dry.  Neurological:     General: No focal deficit present.  Psychiatric:        Mood and Affect: Mood normal.     (all labs ordered are listed, but only abnormal results are displayed) Labs Reviewed  RESP PANEL BY RT-PCR (RSV, FLU A&B, COVID)  [id number]  GROUP A STREP BY PCR    EKG: None  Radiology: No results found.   Procedures   Medications Ordered in the ED  ibuprofen  (ADVIL ) tablet 600 mg (600 mg Oral Given 04/10/24 0801)  Medical Decision Making Risk OTC drugs.   54 year old well-appearing female presenting with a chief complaint of a sore throat.  Exam was unremarkable.  Given how well she appears with unremarkable exam feel that dangerous pathology is unlikely.  Considered peritonsillar abscess, retropharyngeal abscess, mass, vascular injury, other.  Respiratory and strep PCR's were negative.  Advised supportive care at home and PCP follow-up.  Fluid challenge here with no issue.  Discharged good condition.     Final diagnoses:  Sore throat    ED Discharge Orders     None          Lang Norleen POUR, PA-C 04/10/24 9188    Simon Lavonia SAILOR, MD 04/10/24 1534  "

## 2024-04-10 NOTE — ED Triage Notes (Signed)
Pt c/o sore throat x2 days with headache.

## 2024-05-18 ENCOUNTER — Encounter: Admitting: Family Medicine

## 2024-07-01 ENCOUNTER — Ambulatory Visit: Admitting: Family Medicine
